# Patient Record
Sex: Female | Born: 1995 | Race: White | Hispanic: No | Marital: Married | Smoking: Former smoker
Health system: Southern US, Community
[De-identification: ages and names within clinical notes are randomized; demographics above are authoritative.]

## PROBLEM LIST (undated history)

## (undated) DIAGNOSIS — K5904 Chronic idiopathic constipation: Secondary | ICD-10-CM

## (undated) DIAGNOSIS — IMO0002 Reserved for concepts with insufficient information to code with codable children: Secondary | ICD-10-CM

## (undated) DIAGNOSIS — Q7962 Hypermobile Ehlers-Danlos syndrome: Secondary | ICD-10-CM

## (undated) DIAGNOSIS — F419 Anxiety disorder, unspecified: Secondary | ICD-10-CM

## (undated) DIAGNOSIS — J45909 Unspecified asthma, uncomplicated: Secondary | ICD-10-CM

## (undated) DIAGNOSIS — T7840XA Allergy, unspecified, initial encounter: Secondary | ICD-10-CM

## (undated) DIAGNOSIS — G4711 Idiopathic hypersomnia with long sleep time: Secondary | ICD-10-CM

## (undated) DIAGNOSIS — R55 Syncope and collapse: Secondary | ICD-10-CM

## (undated) DIAGNOSIS — G90A Postural orthostatic tachycardia syndrome (POTS): Secondary | ICD-10-CM

## (undated) DIAGNOSIS — D8940 Mast cell activation, unspecified: Secondary | ICD-10-CM

## (undated) DIAGNOSIS — F32A Depression, unspecified: Secondary | ICD-10-CM

## (undated) DIAGNOSIS — G43E09 Chronic migraine with aura, not intractable, without status migrainosus: Secondary | ICD-10-CM

## (undated) DIAGNOSIS — G629 Polyneuropathy, unspecified: Secondary | ICD-10-CM

## (undated) DIAGNOSIS — D649 Anemia, unspecified: Secondary | ICD-10-CM

## (undated) HISTORY — DX: Polyneuropathy, unspecified: G62.9

## (undated) HISTORY — DX: Allergy, unspecified, initial encounter: T78.40XA

## (undated) HISTORY — PX: OTHER SURGICAL HISTORY: SHX169

## (undated) HISTORY — DX: Syncope and collapse: R55

## (undated) HISTORY — DX: Postural orthostatic tachycardia syndrome (POTS): G90.A

## (undated) HISTORY — DX: Reserved for concepts with insufficient information to code with codable children: IMO0002

## (undated) HISTORY — DX: Hypermobile Ehlers-Danlos syndrome: Q79.62

## (undated) HISTORY — DX: Chronic idiopathic constipation: K59.04

## (undated) HISTORY — DX: Mast cell activation, unspecified: D89.40

## (undated) HISTORY — DX: Depression, unspecified: F32.A

## (undated) HISTORY — DX: Chronic migraine with aura, not intractable, without status migrainosus: G43.E09

## (undated) HISTORY — DX: Unspecified asthma, uncomplicated: J45.909

## (undated) HISTORY — DX: Idiopathic hypersomnia with long sleep time: G47.11

## (undated) HISTORY — DX: Anemia, unspecified: D64.9

---
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
## 2015-02-15 ENCOUNTER — Emergency Department (HOSPITAL_COMMUNITY)
Admission: EM | Admit: 2015-02-15 | Discharge: 2015-02-15 | Disposition: A | Discharge status: Home / Self Care | Payer: BLUE CROSS/BLUE SHIELD | Attending: Emergency Medicine | Admitting: Emergency Medicine

## 2015-02-15 ENCOUNTER — Encounter (HOSPITAL_COMMUNITY): Payer: Self-pay

## 2015-02-15 ENCOUNTER — Ambulatory Visit (HOSPITAL_COMMUNITY)
Admission: EM | Admit: 2015-02-15 | Discharge: 2015-02-15 | Disposition: A | Discharge status: Home / Self Care | Payer: No Typology Code available for payment source | Source: Ambulatory Visit | Attending: Emergency Medicine | Admitting: Emergency Medicine

## 2015-02-15 DIAGNOSIS — Z0441 Encounter for examination and observation following alleged adult rape: Secondary | ICD-10-CM | POA: Insufficient documentation

## 2015-02-15 DIAGNOSIS — Y998 Other external cause status: Secondary | ICD-10-CM | POA: Diagnosis not present

## 2015-02-15 DIAGNOSIS — Y9389 Activity, other specified: Secondary | ICD-10-CM | POA: Insufficient documentation

## 2015-02-15 DIAGNOSIS — Z8659 Personal history of other mental and behavioral disorders: Secondary | ICD-10-CM | POA: Diagnosis not present

## 2015-02-15 DIAGNOSIS — Y9289 Other specified places as the place of occurrence of the external cause: Secondary | ICD-10-CM | POA: Diagnosis not present

## 2015-02-15 DIAGNOSIS — T7621XA Adult sexual abuse, suspected, initial encounter: Secondary | ICD-10-CM | POA: Diagnosis present

## 2015-02-15 HISTORY — DX: Anxiety disorder, unspecified: F41.9

## 2015-02-15 LAB — POC URINE PREG, ED: PREG TEST UR: NEGATIVE

## 2015-02-15 MED ORDER — PROMETHAZINE HCL 25 MG PO TABS
25.0000 mg | ORAL_TABLET | Freq: Four times a day (QID) | ORAL | Status: DC | PRN
  Administered 2015-02-15: 75 mg via ORAL
  Filled 2015-02-15: qty 1

## 2015-02-15 MED ORDER — ULIPRISTAL ACETATE 30 MG PO TABS
30.0000 mg | ORAL_TABLET | Freq: Once | ORAL | Status: AC
  Administered 2015-02-15: 30 mg via ORAL
  Filled 2015-02-15: qty 1

## 2015-02-15 MED ORDER — AZITHROMYCIN 1 G PO PACK
1.0000 g | PACK | Freq: Once | ORAL | Status: AC
  Administered 2015-02-15: 1 g via ORAL
  Filled 2015-02-15: qty 1

## 2015-02-15 MED ORDER — CEFIXIME 400 MG PO TABS
400.0000 mg | ORAL_TABLET | Freq: Once | ORAL | Status: AC
  Administered 2015-02-15: 400 mg via ORAL
  Filled 2015-02-15: qty 1

## 2015-02-15 MED ORDER — METRONIDAZOLE 500 MG PO TABS
2000.0000 mg | ORAL_TABLET | Freq: Once | ORAL | Status: AC
  Administered 2015-02-15: 2000 mg via ORAL
  Filled 2015-02-15: qty 4
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
## 2015-02-15 NOTE — ED Notes (Signed)
Aware of pt's being confidential and visitors. Pt comfortable with boyfriend coming back.
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
## 2015-02-15 NOTE — ED Notes (Signed)
[facility]
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
## 2015-02-15 NOTE — ED Notes (Signed)
Aware of Sane RN and plan of care
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
## 2015-05-19 ENCOUNTER — Encounter (HOSPITAL_COMMUNITY): Payer: Self-pay | Admitting: *Deleted

## 2015-05-19 ENCOUNTER — Emergency Department (HOSPITAL_COMMUNITY)
Admission: EM | Admit: 2015-05-19 | Discharge: 2015-05-20 | Disposition: A | Discharge status: Home / Self Care | Payer: BLUE CROSS/BLUE SHIELD | Attending: Emergency Medicine | Admitting: Emergency Medicine

## 2015-05-19 DIAGNOSIS — M25561 Pain in right knee: Secondary | ICD-10-CM | POA: Insufficient documentation

## 2015-05-19 DIAGNOSIS — F419 Anxiety disorder, unspecified: Secondary | ICD-10-CM | POA: Diagnosis not present

## 2015-05-19 DIAGNOSIS — Z79899 Other long term (current) drug therapy: Secondary | ICD-10-CM | POA: Insufficient documentation

## 2015-05-19 NOTE — ED Provider Notes (Signed)
CSN: [id number]     Arrival date & time 05/19/15  2238 History  By signing my name below, I, Lyndel Safe, attest that this documentation has been prepared under the direction and in the presence of Felicie Morn, NP. Electronically Signed: Lyndel Safe, ED Scribe. 05/19/2015. 11:49 PM.   Chief Complaint  Patient presents with  . Knee Pain   Patient is a 20 y.o. female presenting with knee pain. The history is provided by the patient. No language interpreter was used.  Knee Pain Location:  Knee Time since incident:  1 week Injury: no   Knee location:  R knee Pain details:    Radiates to:  Does not radiate   Severity:  Mild   Duration:  1 week   Timing:  Constant   Progression:  Unchanged Chronicity:  New Relieved by:  Nothing Worsened by:  Bearing weight Ineffective treatments:  Ice Associated symptoms: swelling (minimal)    HPI Comments: Shelby Dyer is a 20 y.o. female who presents to the Emergency Department complaining of constant, mild anterior right knee pain X ~ 1 week, onset without injury or trauma, that is exacerbated with weight bearing. Pt has been applying ice without significant relief. She is ambulatory without difficulty.   Past Medical History  Diagnosis Date  . Anxiety    History reviewed. No pertinent past surgical history. No family history on file. Social History  Substance Use Topics  . Smoking status: Never Smoker   . Smokeless tobacco: None  . Alcohol Use: No   OB History    No data available     Review of Systems  Musculoskeletal: Positive for arthralgias ( right knee). Negative for gait problem.  Skin: Negative for color change and wound.  All other systems reviewed and are negative.  Allergies  Review of patient's allergies indicates no known allergies.  Home Medications   Prior to Admission medications   Medication Sig Start Date End Date Taking? Authorizing Provider  Armodafinil 250 MG tablet Take 125 mg by mouth daily.  02/10/15   Historical Provider, MD  hydrOXYzine (ATARAX/VISTARIL) 25 MG tablet Take 25-50 tablets by mouth every 6 (six) hours as needed for anxiety.  12/08/14   Historical Provider, MD  ibuprofen (ADVIL,MOTRIN) 200 MG tablet Take 400 mg by mouth every 6 (six) hours as needed for moderate pain.    Historical Provider, MD  levonorgestrel (MIRENA) 20 MCG/24HR IUD 1 each by Intrauterine route once.    Historical Provider, MD  venlafaxine XR (EFFEXOR-XR) 37.5 MG 24 hr capsule Take 150 mg by mouth daily. 12/08/14   Historical Provider, MD   BP 125/78 mmHg  Pulse 118  Temp(Src) 98 F (36.7 C) (Oral)  Resp 20  Ht  (1.753 m)  Wt 254 lb 8 oz (115.44 kg)  BMI 37.57 kg/m2  SpO2 96% Physical Exam  Constitutional: She is oriented to person, place, and time. She appears well-developed and well-nourished. No distress.  HENT:  Head: Normocephalic.  Eyes: Conjunctivae are normal.  Neck: Normal range of motion. Neck supple.  Cardiovascular: Normal rate.   Pulmonary/Chest: Effort normal. No respiratory distress.  Musculoskeletal: Normal range of motion. She exhibits tenderness.  Tenderness just below the right knee with minimal swelling, knee stable, good pulse and sensation, strength normal.   Neurological: She is alert and oriented to person, place, and time. Coordination normal.  Skin: Skin is warm.  Psychiatric: She has a normal mood and affect. Her behavior is normal.  Nursing note and  vitals reviewed.   ED Course  Procedures  DIAGNOSTIC STUDIES: Oxygen Saturation is 96% on RA, adquate by my interpretation.    COORDINATION OF CARE: 11:45 PM Discussed treatment plan with pt at bedside which includes to order Xray of right knee. PT agreeable to plan.  Imaging Review Dg Knee Complete 4 Views Right  05/20/2015  CLINICAL DATA:  Right knee pain for 1 week.  No known injury. EXAM: RIGHT KNEE - COMPLETE 4+ VIEW COMPARISON:  None. FINDINGS: There is no evidence of fracture, dislocation, or joint  effusion. There is no evidence of arthropathy or other focal bone abnormality. Soft tissues are unremarkable. IMPRESSION: Negative radiographs of the right knee. Electronically Signed   By: Rubye Oaks M.D.   On: 05/20/2015 00:27   I have personally reviewed and evaluated these images results as part of my medical decision-making.  Radiology results reviewed and shared with patient. MDM   Final diagnoses:  None    Patient X-Ray negative for obvious fracture or dislocation.  Pt advised to follow up with orthopedics. Patient given knee sleeve while in ED, conservative therapy recommended and discussed. Patient will be discharged home & is agreeable with above plan. Returns precautions discussed. Pt appears safe for discharge.  I personally performed the services described in this documentation, which was scribed in my presence. The recorded information has been reviewed and is accurate.    Felicie Morn, NP 05/20/15 1610  Gwyneth Sprout, MD 05/20/15 [phone]

## 2015-05-19 NOTE — ED Notes (Signed)
Pt c/o rt knee pain for one week  No known injury  lmp   iud

## 2015-05-20 ENCOUNTER — Emergency Department (HOSPITAL_COMMUNITY): Payer: BLUE CROSS/BLUE SHIELD

## 2015-05-20 MED ORDER — NAPROXEN 500 MG PO TABS: 500.0000 mg | ORAL_TABLET | Freq: Two times a day (BID) | ORAL | Status: DC

## 2015-05-20 NOTE — ED Notes (Signed)
Patient able to ambulate independently  

## 2015-05-20 NOTE — Discharge Instructions (Signed)

## 2019-03-13 DIAGNOSIS — I4711 Inappropriate sinus tachycardia, so stated: Secondary | ICD-10-CM | POA: Insufficient documentation

## 2019-03-13 DIAGNOSIS — R000 Tachycardia, unspecified: Secondary | ICD-10-CM | POA: Insufficient documentation

## 2019-06-25 DIAGNOSIS — G5611 Other lesions of median nerve, right upper limb: Secondary | ICD-10-CM | POA: Insufficient documentation

## 2019-06-25 DIAGNOSIS — S61451A Open bite of right hand, initial encounter: Secondary | ICD-10-CM | POA: Insufficient documentation

## 2021-04-03 DIAGNOSIS — K589 Irritable bowel syndrome without diarrhea: Secondary | ICD-10-CM | POA: Insufficient documentation

## 2021-05-17 ENCOUNTER — Ambulatory Visit (INDEPENDENT_AMBULATORY_CARE_PROVIDER_SITE_OTHER): Payer: BLUE CROSS/BLUE SHIELD | Admitting: Family Medicine

## 2021-05-17 ENCOUNTER — Encounter: Payer: Self-pay | Admitting: Family Medicine

## 2021-05-17 VITALS — BP 134/92 | Ht 69.0 in | Wt 263.0 lb

## 2021-05-17 DIAGNOSIS — D8940 Mast cell activation, unspecified: Secondary | ICD-10-CM | POA: Diagnosis not present

## 2021-05-17 DIAGNOSIS — G43711 Chronic migraine without aura, intractable, with status migrainosus: Secondary | ICD-10-CM | POA: Insufficient documentation

## 2021-05-17 DIAGNOSIS — M357 Hypermobility syndrome: Secondary | ICD-10-CM

## 2021-05-17 DIAGNOSIS — I951 Orthostatic hypotension: Secondary | ICD-10-CM | POA: Diagnosis not present

## 2021-05-17 DIAGNOSIS — G43C1 Periodic headache syndromes in child or adult, intractable: Secondary | ICD-10-CM | POA: Diagnosis not present

## 2021-05-17 NOTE — Assessment & Plan Note (Signed)
Acute on chronic in nature.  -Referral to cardiology.

## 2021-05-17 NOTE — Assessment & Plan Note (Signed)
Acute on chronic in nature. -Referral to neurology.

## 2021-05-17 NOTE — Assessment & Plan Note (Signed)
Has had intermittent episodes of anaphylaxis and has been established with an allergist

## 2021-05-17 NOTE — Assessment & Plan Note (Signed)
Beighton score 9/9.  Has a family history as well as multiple syndromes that are falling in line with the constellation of hypermobility and Ehlers-Danlos. -Counseled on home exercise therapy and supportive care. -Counseled on body braid. -Referral to physical therapy.

## 2021-05-17 NOTE — Progress Notes (Signed)
°  Shelby Dyer - 26 y.o. female MRN [id number]  Date of birth: 01-Aug-1995  SUBJECTIVE:  Including CC & ROS.  No chief complaint on file.   Shelby Dyer is a 26 y.o. female that is presenting with orthostatic tachycardia, multiple allergies, multiple joint pains, long history of migraines.  She has a mother and grandmother both diagnosed with Ehlers-Danlos.  She has daily migraines since October.  She has had her eyes checked recently.  She continues to have orthostatic tachycardia and has been started on metoprolol.  Has pain in multiple joints on a regular basis.  No history of surgery.  She takes ibuprofen and Tylenol as needed.  Review of the Holter monitor from 01/10/2021 shows 6 days and 22 hours with an average heart rate of 88 bpm with no arrhythmia and rare PACs and PVCs.  There were 27 patient triggered events all of which corresponded to sinus tachycardia. Review of the CT head from 02/04/2021 shows no acute changes. Review of the sedimentation rate and C-reactive protein from 2021 were normal. Review of the TSH from 2020 was normal.  Review of Systems See HPI   HISTORY: Past Medical, Surgical, Social, and Family History Reviewed & Updated per EMR.   Pertinent Historical Findings include:  Past Medical History:  Diagnosis Date   Anxiety     History reviewed. No pertinent surgical history.   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  Has a family history as well as multiple syndromes that are falling in line with the constellation of hypermobility and  Ehlers-Danlos. -Counseled on home exercise therapy and supportive care. -Counseled on body braid. -Referral to physical therapy.  Intractable periodic headache syndrome Acute on chronic in nature. -Referral to neurology.  Dysautonomia orthostatic hypotension syndrome Acute on chronic in nature.  -Referral to cardiology.

## 2021-05-17 NOTE — Patient Instructions (Signed)
Nice to meet you Please check out body braid  Please look into [email or web address]  We've made referrals today   Please send me a message in MyChart with any questions or updates.  Please see me back as needed.   --Dr. 

## 2021-05-18 ENCOUNTER — Other Ambulatory Visit: Payer: Self-pay | Admitting: Family Medicine

## 2021-05-22 ENCOUNTER — Encounter: Payer: Self-pay | Admitting: Family Medicine

## 2021-06-09 ENCOUNTER — Other Ambulatory Visit: Payer: Self-pay

## 2021-06-12 ENCOUNTER — Encounter: Payer: Self-pay | Admitting: Physical Therapy

## 2021-06-12 ENCOUNTER — Ambulatory Visit: Payer: 59 | Attending: Family Medicine | Admitting: Physical Therapy

## 2021-06-12 ENCOUNTER — Other Ambulatory Visit: Payer: Self-pay

## 2021-06-12 DIAGNOSIS — R2689 Other abnormalities of gait and mobility: Secondary | ICD-10-CM | POA: Diagnosis present

## 2021-06-12 DIAGNOSIS — M357 Hypermobility syndrome: Secondary | ICD-10-CM | POA: Insufficient documentation

## 2021-06-12 DIAGNOSIS — M2550 Pain in unspecified joint: Secondary | ICD-10-CM | POA: Insufficient documentation

## 2021-06-12 NOTE — Therapy (Signed)
?OUTPATIENT PHYSICAL THERAPY THORACOLUMBAR EVALUATION ? ? ?Patient Name: Shelby Dyer ?MRN: [id number] ?DOB:09-15-1995, 26 y.o., female ?Today's Date: 06/12/2021 ? ? PT End of Session - 06/12/21 1024   ? ? Visit Number 1   ? Number of Visits 16   ? Date for PT Re-Evaluation 08/07/21   ? Authorization Type BCBS   ? PT Start Time 1015   ? PT Stop Time 1100   ? PT Time Calculation (min) 45 min   ? Activity Tolerance Patient tolerated treatment well   ? Behavior During Therapy [facility] for tasks assessed/performed   ? ?  ?  ? ?  ? ? ?Past Medical History:  ?Diagnosis Date  ? Anxiety   ? ?History reviewed. No pertinent surgical history. ?Patient Active Problem List  ? Diagnosis Date Noted  ? 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 Dysautonomia orthostatic hypotension syndrome 05/17/2021  ? 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 IBS (irritable bowel syndrome) 04/03/2021  ? Dog bite, hand, right, initial encounter 06/25/2019  ? 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 Sinus tachycardia 03/13/2019  ? 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 ? ?PCP: Eulogio Bear, MD ? ?REFERRING PROVIDER: Myra Rude, MD ? ?REFERRING DIAG: Hypermobility syndrome ? ?THERAPY DIAG:  ?Hypermobility syndrome, weakness,  ? ?ONSET DATE: chronic  ? ?SUBJECTIVE:                                                                                                                                                                                          ? ?SUBJECTIVE STATEMENT: ?Patient presents with chronic medical issues including hypermobility, dysautonomia.  She has issues with prolonged standing, syncope and irregular HR.  She has migrating joint pain (ankle, knees, L shoulder , Lower T spine).  Her joints sublux (knees and shoulder) but don's dislocate.  She struggles with walking, loses balance and has difficulty maintaining normal speed. She recalls having issues as a young child,  allergies and problems with coordination and balance.  She would like to see if PT can assist her overall mobility and improve quality of life.    ? ? ?PERTINENT HISTORY:  ?Migraines, hypermobility  ?MCAS, tachycardia, IBS ?Her mother has EDS and her sister has POTS ? ?PAIN:  ?Are you having pain? Yes: NPRS scale: 3/10 ?Pain location: generalized, neck today ?Pain description: achy ?Aggravating factors: activity. Back pain (standing long time) and carrying item in R hand ?Relieving factors: meds, heating pad, rest  ? ? ?PRECAUTIONS: Other: POTS , positional changes.  Give seated rest breaks for standing, blood pools in LE  ? ?WEIGHT BEARING RESTRICTIONS No ? ?  FALLS:  ?Has patient fallen in last 6 months? No, Number of falls: 0, does not usually fall but does have syncope and lacks confidence  ? ?LIVING ENVIRONMENT: ?Lives with: lives with their spouse ?Lives in: House/apartment ?Stairs: Yes; Internal: 12 steps; on right going up ?Has following equipment at home:  therapy dog , considering a cane  ? 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 ?PLOF: Independent, Vocation/Vocational requirements: crouching, standing, walking , and Leisure: used to go hiking and be outdoors, married 6 mos ago . Can do 1 task per day (laundry OR dishes)  ? ?PATIENT GOALS I would like to go hiking again.  Does enjoy exercising, weightlifting  ? ? ?Assessment (as on reg. Template) ?Beighton Scale 7/8 ?Lumbar (1_/1) ?Knees (_2/2) ?Elbows (_2/2)  ?5th digit (1/2) ?Thumb (2/2) ? ?OBJECTIVE:  ? ?DIAGNOSTIC FINDINGS:  ?CT last yet ? ?PATIENT SURVEYS:  ?Bristol Impact Scale  given on eval  ? ?COGNITION: ? Overall cognitive status: Within functional limits for tasks assessed   ?  ?SENSATION: ?Reports hands, feet in the past but improved with med change  ?Amrs and hands sleeping AM frequently ? ?MUSCLE LENGTH: ?Hamstrings: tight ? ?POSTURE:  ?Forward head, rounded shoulders ? ?PALPATION: ?NT ? ?LUMBAR ROM:  ? ?Active  A/PROM  ?06/12/2021   ?Flexion Palms to flor  ?Extension Hyper  ?Right lateral flexion   ?Left lateral flexion   ?Right rotation Hyper  ?Left rotation Hyper   ? (Blank rows = not tested) ? ?LE ROM: ? ?Active  Right ?06/12/2021 Left ?06/12/2021  ?Hip flexion    ?Hip extension    ?Hip abduction    ?Hip adduction    ?Hip internal rotation Rel tight Rel tight   ?Hip external rotation WNL WNL  ?Knee flexion    ?Knee extension    ?Ankle dorsiflexion    ?Ankle plantarflexion    ?Ankle inversion    ?Ankle eversion    ? (Blank rows = not tested) ? ?LE MMT: ? ?MMT Right ?06/12/2021 Left ?06/12/2021  ?Hip flexion 4+ 4+  ?Hip extension 4- 4-  ?Hip abduction 4 4  ?Hip adduction    ?Hip internal rotation    ?Hip external rotation    ?Knee flexion 4+ 4  ?Knee extension 4+ 4+  ?Ankle dorsiflexion 4+ 4+  ?Ankle plantarflexion    ?Ankle inversion    ?Ankle eversion    ? (Blank rows = not tested) ? ?FUNCTIONAL TESTS:  ?2 minute walk test: 412 feet , HR 120 max Sa02 99% ? ?GAIT: ?Distance walked: 150 ?Assistive device utilized: None ?Level of assistance: Complete Independence ?Comments: none  ? ? ? ?TODAY'S TREATMENT  ?Eval, intro, POC  ? ? ?PATIENT EDUCATION:  ?Education details: PT, POC ?Person educated: Patient ?Education method: Explanation ?Education comprehension: verbalized understanding ? ? ?HOME EXERCISE PROGRAM: ?None yet  ? ?ASSESSMENT: ? ?CLINICAL IMPRESSION: ?Patient is a 26 y.o. female who was seen today for physical therapy evaluation and treatment for hypermobility syndrome with dysautonomia.  HR tachycardia at rest 114. She has min to mod pain overall.  Hips are stable and she has decent strength.  She tolerated the session well with her therapy dog present, mainly for PTSD support.  She will benefit from skilled PT to include core strength and balance/proprioceptive work. ? ? ?OBJECTIVE IMPAIRMENTS cardiopulmonary status limiting activity, decreased activity tolerance, decreased balance, decreased coordination, decreased endurance, decreased  mobility, difficulty walking, decreased ROM, decreased strength, dizziness, obesity, and pain.  ? ?ACTIVITY LIMITATIONS cleaning, community activity,  meal prep, occupation, laundry, yard work, and shopping.  ? ?PERSONAL FACTORS Past/current experiences, Time since onset of injury/illness/exacerbation, and 3+ comorbidities: chronic joint pain, hypersomnelence, migraines, POTS, MCAS  are also affecting patient's functional outcome.  ? ? ?REHAB POTENTIAL: Excellent ? ?CLINICAL DECISION MAKING: Stable/uncomplicated ? ?EVALUATION COMPLEXITY: Low ? ? ?GOALS: ?Goals reviewed with patient? No ? ?SHORT TERM GOALS: Target date: 07/03/2021 ? ?Pt will be I with HEP for core, pelvic stability  ?Baseline: unknown ?Goal status: INITIAL ? ?2.  Pt will complete Survey for hypermobility [facility] Impact Scale) and set goal.  ?Baseline: given on eval  ?Goal status: INITIAL ? ?3.  Pt will report increased tolerance for work, home activities, reporting less fatigue overall with 1-2 tasks  ?Baseline: low energy , hard to do > 1 simple home task  ?Goal status: INITIAL ? ?4  Pt will be screened for balance (DGI) and goal set  ?Baseline: NT on eval  ?Goal status: INITIAL ? ?LONG TERM GOALS: Target date: 08/07/2021 ? ?Pt will be able to walk 600 feet or more on the 2 min walk test to demo improved exercise tolerance  ?Baseline: 412 feet  ?Goal status: INITIAL ? ?2.  Pt will understand and demo proper posture and body mechanics as it pertains to lifting and squatting for work, home tasks  ?Baseline: needs reinforcement ?Goal status: INITIAL ? ?3.  Pt will be able to walk on uneven ground with improved confidence in balance  ?Baseline: loses balance  ?Goal status: INITIAL ? ?4.  Pt will demo 5/5 strength in UE and LE for maximal joint support  ?Baseline: 4-/5 to 4+/5  ?Goal status: INITIAL ? ?5.  Pt will be able to meet goals for balance based on results.  ?Baseline: TBA ?Goal status: INITIAL ? ? ? ? ?PLAN: ?PT FREQUENCY: 1x/week ? ?PT DURATION:  8 weeks ? ?PLANNED INTERVENTIONS: Therapeutic exercises, Therapeutic activity, Neuromuscular re-education, Balance training, Gait training, Patient/Family education, Joint mobilization, Aquatic Therapy, Electric

## 2021-06-13 ENCOUNTER — Encounter: Payer: Self-pay | Admitting: Cardiology

## 2021-06-13 ENCOUNTER — Ambulatory Visit (INDEPENDENT_AMBULATORY_CARE_PROVIDER_SITE_OTHER): Payer: BLUE CROSS/BLUE SHIELD | Admitting: Cardiology

## 2021-06-13 ENCOUNTER — Ambulatory Visit (INDEPENDENT_AMBULATORY_CARE_PROVIDER_SITE_OTHER): Payer: 59

## 2021-06-13 VITALS — BP 142/78 | HR 124 | Ht 69.0 in | Wt 262.0 lb

## 2021-06-13 DIAGNOSIS — I951 Orthostatic hypotension: Secondary | ICD-10-CM

## 2021-06-13 DIAGNOSIS — R000 Tachycardia, unspecified: Secondary | ICD-10-CM

## 2021-06-13 DIAGNOSIS — F509 Eating disorder, unspecified: Secondary | ICD-10-CM | POA: Insufficient documentation

## 2021-06-13 DIAGNOSIS — M357 Hypermobility syndrome: Secondary | ICD-10-CM | POA: Diagnosis not present

## 2021-06-13 DIAGNOSIS — M25559 Pain in unspecified hip: Secondary | ICD-10-CM | POA: Insufficient documentation

## 2021-06-13 NOTE — Progress Notes (Signed)
? ?Cardiology Consultation:   ? ?Date:  06/13/2021  ? ?ID:  Shelby Dyer, DOB [birth year], MRN [id number] ? ?PCP:  Legrand Como, NP  ?Cardiologist:  Jenne Campus, MD  ? ?Referring MD: Rosemarie Ax, MD  ? ?Chief Complaint  ?Patient presents with  ? Discuss possible Pott's or Hypotensive   ? ? ?History of Present Illness:   ? ?Shelby Dyer is a 26 y.o. female who is being seen today for the evaluation of persistent tachycardia with possibility of POTS syndrome at the request of Rosemarie Ax, MD. with past medical history significant for hypermobility syndrome look like she does have Ehlers-Danlos syndrome, she is been diagnosed previously with inappropriate sinus tachycardia syndrome also orthostatic hypotension.  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  She was referred to [place] because of tachycardia it looks like she got persistent sinus tachycardia in the matter-of-fact her EKG today show me sinus tachycardia at rate of 125.  She feels this and she feels very bad when she has and she also complained of having some dizzy spells.  She said she passed out maybe 3 times in her life last time about 2 to 3 weeks ago interesting story is that her doctor very appropriately give her small dose of beta-blocker which seems to be helping however she wanted to be seen by [place] in the our office we doubt medications, therefore she stopped her metoprolol.  After that couple days later she was walking upstairs then she became dizzy sweaty lightheaded tunnel vision and passed out.  Prodromal time was for about 30 seconds also usually is a little longer.  She quickly regained consciousness and she was fine after that. ?She is being evaluated at [facility] she did have echocardiogram that a monitor at the time she was told to have inappropriate sinus tachycardia syndrome then she did see some cardiologist in [place].  She was given beta-blocker at that  time. ?She does not smoke, does not drink ? ? ?Past Medical History:  ?Diagnosis Date  ? Anxiety   ? ? ?Past Surgical History:  ?Procedure Laterality Date  ? Tub place in both ear  Bilateral   ? When she was 26 years of age  ? WISDOM TOOTH EXTRACTION  2006  ? All 4  ? ? ?Current Medications: ?Current Meds  ?Medication Sig  ? ALPRAZolam (XANAX XR) 0.5 MG 24 hr tablet Take 0.5 mg by mouth as needed for anxiety.  ? cetirizine (ZYRTEC ALLERGY) 10 MG tablet Take 20 mg by mouth daily.  ? Cyanocobalamin (VITAMIN B12) 1000 MCG TBCR Take 1 tablet by mouth once a week.  ? EPINEPHrine 0.3 mg/0.3 mL IJ SOAJ injection Inject 0.3 mg into the muscle as needed for anaphylaxis.  ? famotidine (PEPCID) 40 MG tablet Take 40 mg by mouth 2 (two) times daily.  ? fexofenadine (ALLEGRA) 180 MG tablet Take 180 mg by mouth as needed for allergies or rhinitis.  ? hydrOXYzine (ATARAX) 25 MG tablet Take 25-50 mg by mouth every 8 (eight) hours as needed for anxiety.  ? hyoscyamine (LEVSIN) 0.125 MG/ML solution Take 0.125 mg by mouth every 4 (four) hours as needed for bladder spasms.  ? metoprolol tartrate (LOPRESSOR) 25 MG tablet Take 25 mg by mouth 2 (two) times daily.  ? Multiple Vitamin (MULTIVITAMIN) tablet Take 1 tablet by mouth daily.  ? nortriptyline (PAMELOR) 25 MG capsule Take 25-50 mg by mouth daily after supper.  ?  ? ?  Allergies:   Latex  ? ?Social History  ? ?Socioeconomic History  ? Marital status: Married  ?  Spouse name: Not on file  ? Number of children: Not on file  ? Years of education: Not on file  ? Highest education level: Not on file  ?Occupational History  ? Not on file  ?Tobacco Use  ? Smoking status: Never  ? Smokeless tobacco: Not on file  ?Substance and Sexual Activity  ? Alcohol use: No  ? Drug use: Not on file  ? Sexual activity: Not on file  ?Other Topics Concern  ? Not on file  ?Social History Narrative  ? Not on file  ? ?Social Determinants of Health  ? ?Financial Resource Strain: Not on file  ?Food Insecurity: Not  on file  ?Transportation Needs: Not on file  ?Physical Activity: Not on file  ?Stress: Not on file  ?Social Connections: Not on file  ?  ? ?Family History: ?The patient's family history includes Atrial fibrillation in her father; Hypertension in her brother; Other in her sister. ?ROS:   ?Please see the history of present illness.    ?All 14 point review of systems negative except as described per history of present illness. ? ?EKGs/Labs/Other Studies Reviewed:   ? ?The following studies were reviewed today: ? ? ?EKG:  EKG is  ordered today.  The ekg ordered today demonstrates sinus tachycardia rate 125, right axis deviation, low voltage, cannot rule out inferior infarct. ? ?Recent Labs: ?No results found for requested labs within last 8760 hours.  ?Recent Lipid Panel ?No results found for: CHOL, TRIG, HDL, CHOLHDL, VLDL, LDLCALC, LDLDIRECT ? ?Physical Exam:   ? ?VS:  BP (!) 142/78 (BP Location: Left Arm, Patient Position: Sitting)   Pulse (!) 124   Ht 5\' 9"  (1.753 m)   Wt 262 lb (118.8 kg)   SpO2 97%   BMI 38.69 kg/m?    ? ?Wt Readings from Last 3 Encounters:  ?06/13/21 262 lb (118.8 kg)  ?05/17/21 263 lb (119.3 kg)  ?05/19/15 254 lb 8 oz (115.4 kg) (>99 %, Z= 2.52)*  ? ?* Growth percentiles are based on CDC (Girls, 2-20 Years) data.  ?  ? ?GEN:  Well nourished, well developed in no acute distress ?HEENT: Normal ?NECK: No JVD; No carotid bruits ?LYMPHATICS: No lymphadenopathy ?CARDIAC: RRR, no murmurs, no rubs, no gallops ?RESPIRATORY:  Clear to auscultation without rales, wheezing or rhonchi  ?ABDOMEN: Soft, non-tender, non-distended ?MUSCULOSKELETAL:  No edema; No deformity  ?SKIN: Warm and dry ?NEUROLOGIC:  Alert and oriented x 3 ?PSYCHIATRIC:  Normal affect  ? ?ASSESSMENT:   ? ?1. Inappropriate sinus tachycardia   ?2. Hypermobility syndrome   ?3. Dysautonomia orthostatic hypotension syndrome   ? ?PLAN:   ? ?In order of problems listed above: ? ?Sinus tachycardia which appears to be inappropriate sinus  tachycardia, I cannot exclude POTS, although his disease is belong to the same class of diseases which are related to autonomic dysfunction.  So far no organic reason for her tachycardia.  I will ask her to have echocardiogram to make sure her heart is structurally normal, I did review laboratory tests which were perfectly normal did include CBC complete pensive metabolic panel, however do not see TSH.  We will do the test.  Last time TSH was checked when she was at [facility] in 2020.  I will put a Zio patch on her for 2 weeks.  Ask her after couple days to start taking back metoprolol.  We  will also schedule her to have echocardiogram if she was structurally her heart is normal. ?Hypermobility syndrome/Ehlers-Danlos syndrome that being follow-up by orthopedics. ?Dysautonomia with orthostatic hypotension syndrome we will check orthostasis today.  I encouraged her to stay well-hydrated I recommended to drink Gatorade.  I also recommend start exercises on the regular basis gradually slowly improved stamina.  That should help with inappropriate sinus tachycardia as well as POTS.  We will try to treat this with beta-blocker.  She may required ivabradine depends how she respond to beta-blocker first ? ? ?Medication Adjustments/Labs and Tests Ordered: ?Current medicines are reviewed at length with the patient today.  Concerns regarding medicines are outlined above.  ?No orders of the defined types were placed in this encounter. ? ?No orders of the defined types were placed in this encounter. ? ? ?Signed, ?Park Liter, MD, [facility]. ?06/13/2021 10:33 AM    ?[place] ?

## 2021-06-13 NOTE — Patient Instructions (Signed)
Medication Instructions:  ?Your physician recommends that you continue on your current medications as directed. Please refer to the Current Medication list given to you today. ? ?*If you need a refill on your cardiac medications before your next appointment, please call your pharmacy* ? ? ?Lab Work: ?None ?If you have labs (blood work) drawn today and your tests are completely normal, you will receive your results only by: ?MyChart Message (if you have MyChart) OR ?A paper copy in the mail ?If you have any lab test that is abnormal or we need to change your treatment, we will call you to review the results. ? ? ?Testing/Procedures: ?A zio monitor was ordered today. It will remain on for 14 days. You will then return monitor and event diary in provided box. It takes 1-2 weeks for report to be downloaded and returned to us. We will call you with the results. If monitor falls off or has orange flashing light, please call Zio for further instructions.  ? ?Your physician has requested that you have an echocardiogram. Echocardiography is a painless test that uses sound waves to create images of your heart. It provides your doctor with information about the size and shape of your heart and how well your heart?s chambers and valves are working. This procedure takes approximately one hour. There are no restrictions for this procedure. ? ? ? ?Follow-Up: ?At [facility], you and your health needs are our priority.  As part of our continuing mission to provide you with exceptional heart care, we have created designated Provider Care Teams.  These Care Teams include your primary Cardiologist (physician) and Advanced Practice Providers (APPs -  Physician Assistants and Nurse Practitioners) who all work together to provide you with the care you need, when you need it. ? ?We recommend signing up for the patient portal called "MyChart".  Sign up information is provided on this After Visit Summary.  MyChart is used to connect with  patients for Virtual Visits (Telemedicine).  Patients are able to view lab/test results, encounter notes, upcoming appointments, etc.  Non-urgent messages can be sent to your provider as well.   ?To learn more about what you can do with MyChart, go to [email or web address].   ? ?Your next appointment:   ?3 month(s) ? ?The format for your next appointment:   ?In Person ? ?Provider:   ?Robert Krasowski, MD  ? ? ?Other Instructions ?None ? ?

## 2021-06-16 ENCOUNTER — Encounter: Payer: Self-pay | Admitting: Family Medicine

## 2021-06-19 ENCOUNTER — Other Ambulatory Visit: Payer: Self-pay | Admitting: Family Medicine

## 2021-06-19 DIAGNOSIS — D8940 Mast cell activation, unspecified: Secondary | ICD-10-CM

## 2021-06-21 ENCOUNTER — Encounter: Payer: Self-pay | Admitting: Physical Therapy

## 2021-06-21 ENCOUNTER — Ambulatory Visit: Payer: 59 | Admitting: Physical Therapy

## 2021-06-21 DIAGNOSIS — M357 Hypermobility syndrome: Secondary | ICD-10-CM | POA: Diagnosis not present

## 2021-06-21 DIAGNOSIS — M2550 Pain in unspecified joint: Secondary | ICD-10-CM

## 2021-06-21 DIAGNOSIS — R2689 Other abnormalities of gait and mobility: Secondary | ICD-10-CM

## 2021-06-21 NOTE — Therapy (Signed)
?OUTPATIENT PHYSICAL THERAPY TREATMENT NOTE ? ? ?Patient Name: Shelby SinningKathleen M Harry-Oesterle ?MRN: [id number] ?DOB:08/10/1995, 26 y.o., female ?Today's Date: 06/21/2021 ? ?PCP: Vernell MorgansBovender, Margaret M, NP ?REFERRING PROVIDER: Myra RudeSchmitz, Jeremy E, MD ? ? PT End of Session - 06/21/21 1242   ? ? Visit Number 2   ? Number of Visits 16   ? Date for PT Re-Evaluation 08/07/21   ? Authorization Type BCBS   ? PT Start Time 1240   10 min late  ? PT Stop Time 1313   ? PT Time Calculation (min) 33 min   ? ?  ?  ? ?  ? ? ?Past Medical History:  ?Diagnosis Date  ? Anxiety   ? ?Past Surgical History:  ?Procedure Laterality Date  ? Tub place in both ear  Bilateral   ? When she was 26 years of age  ? WISDOM TOOTH EXTRACTION  2006  ? All 4  ? ?Patient Active Problem List  ? Diagnosis Date Noted  ? Eating disorder 06/13/2021  ? Hip pain 06/13/2021  ? 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 Dysautonomia orthostatic hypotension syndrome 05/17/2021  ? 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 IBS (irritable bowel syndrome) 04/03/2021  ? Dog bite, hand, right, initial encounter 06/25/2019  ? 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 ? ?PCP: Eulogio BearManor, James P, MD ?  ?REFERRING PROVIDER: Myra RudeSchmitz, Jeremy E, MD ? ?THERAPY DIAG:  ?Hypermobility syndrome ? ?Other abnormalities of gait and mobility ? ?Polyarthralgia ? ?PERTINENT HISTORY: Migraines, hypermobility  ?MCAS, tachycardia, IBS ?Her mother has EDS and her sister has POTS ? ?PRECAUTIONS: Other: POTS , positional changes.  Give seated rest breaks for standing, blood pools in LE  ? ?SUBJECTIVE: Last night I had horrible neck pain which turned into a migraine and sinus pain. Right now my left ankle is the worst 3/10. The other body parts that hurt today are my: neck, right sided spine, left hip, left knee, left ankle, left shoulder.  ? ?PAIN:  ?Are you having pain? Yes:  NPRS scale: 3/10 ?Pain location: generalized, neck today ?Pain description: achy ?Aggravating factors: activity. Back pain (standing long time) and carrying item in R hand ?Relieving factors: meds, heating pad, rest  ? ? ?OBJECTIVE:  ?  ?DIAGNOSTIC FINDINGS:  ?CT last yet ?  ?PATIENT SURVEYS:  ?[place] Impact Scale  given on eval  ?  ?COGNITION: ?           Overall cognitive status: Within functional limits for tasks assessed              ?            ?SENSATION: ?Reports hands, feet in the past but improved with med change  ?Amrs and hands sleeping AM frequently ?  ?MUSCLE LENGTH: ?Hamstrings: tight ?  ?POSTURE:  ?Forward head, rounded shoulders ?  ?PALPATION: ?NT ?  ?LUMBAR ROM:  ?  ?Active  A/PROM  ?06/12/2021  ?Flexion Palms to flor  ?Extension Hyper  ?Right lateral flexion    ?Left lateral flexion    ?Right rotation Hyper  ?Left rotation Hyper   ? (Blank rows = not tested) ?  ?LE ROM: ?  ?Active  Right ?06/12/2021 Left ?06/12/2021  ?Hip flexion      ?Hip extension      ?Hip abduction      ?Hip adduction      ?Hip internal rotation Rel tight  Rel tight   ?Hip external rotation WNL WNL  ?Knee flexion      ?Knee extension      ?Ankle dorsiflexion      ?Ankle plantarflexion      ?Ankle inversion      ?Ankle eversion      ? (Blank rows = not tested) ?  ?LE MMT: ?  ?MMT Right ?06/12/2021 Left ?06/12/2021  ?Hip flexion 4+ 4+  ?Hip extension 4- 4-  ?Hip abduction 4 4  ?Hip adduction      ?Hip internal rotation      ?Hip external rotation      ?Knee flexion 4+ 4  ?Knee extension 4+ 4+  ?Ankle dorsiflexion 4+ 4+  ?Ankle plantarflexion      ?Ankle inversion      ?Ankle eversion      ? (Blank rows = not tested) ?  ?FUNCTIONAL TESTS:  ?2 minute walk test: 412 feet , HR 120 max Sa02 99% ?  ?GAIT: ?Distance walked: 150 ?Assistive device utilized: None ?Level of assistance: Complete Independence ?Comments: none  ?  ?  ?  ?TODAY'S TREATMENT  ?Eval, intro, POC  ?  ?  ?PATIENT EDUCATION:  ?Education details: PT, POC ?Person educated:  Patient ?Education method: Explanation ?Education comprehension: verbalized understanding ?  ?  ?HOME EXERCISE PROGRAM: ?None yet  ?  ?ASSESSMENT: ?  ?CLINICAL IMPRESSION: ?Pt reports generalized joint pain at 3/10 or less with left ankle most painful. Today time spent with care activation, spinal positions, and  establishing HEP. She tolerated supine mat stabilization well today with min left hip popping during bent knee raises. After session she reported feeling abdominal soreness.  ? ?Patient is a 26 y.o. female who was seen today for physical therapy evaluation and treatment for hypermobility syndrome with dysautonomia.  HR tachycardia at rest 114. She has min to mod pain overall.  Hips are stable and she has decent strength.  She tolerated the session well with her therapy dog present, mainly for PTSD support.  She will benefit from skilled PT to include core strength and balance/proprioceptive work. ?  ?  ?OBJECTIVE IMPAIRMENTS cardiopulmonary status limiting activity, decreased activity tolerance, decreased balance, decreased coordination, decreased endurance, decreased mobility, difficulty walking, decreased ROM, decreased strength, dizziness, obesity, and pain.  ?  ?ACTIVITY LIMITATIONS cleaning, community activity, meal prep, occupation, laundry, yard work, and shopping.  ?  ?PERSONAL FACTORS Past/current experiences, Time since onset of injury/illness/exacerbation, and 3+ comorbidities: chronic joint pain, hypersomnelence, migraines, POTS, MCAS  are also affecting patient's functional outcome.  ?  ?  ?REHAB POTENTIAL: Excellent ?  ?CLINICAL DECISION MAKING: Stable/uncomplicated ?  ?EVALUATION COMPLEXITY: Low ?  ?  ?GOALS: ?Goals reviewed with patient? No ?  ?SHORT TERM GOALS: Target date: 07/03/2021 ?  ?Pt will be I with HEP for core, pelvic stability  ?Baseline: unknown ?Goal status: INITIAL ?  ?2.  Pt will complete Survey for hypermobility [facility] Impact Scale) and set goal.  ?Baseline: given on eval   ?Goal status: INITIAL ?  ?3.  Pt will report increased tolerance for work, home activities, reporting less fatigue overall with 1-2 tasks  ?Baseline: low energy , hard to do > 1 simple home task  ?Goal status: INITIAL ?  ?4  Pt will be screened for balance (DGI) and goal set  ?Baseline: NT on eval  ?Goal status: INITIAL ?  ?LONG TERM GOALS: Target date: 08/07/2021 ?  ?Pt will be able to walk 600 feet or more on the 2  min walk test to demo improved exercise tolerance  ?Baseline: 412 feet  ?Goal status: INITIAL ?  ?2.  Pt will understand and demo proper posture and body mechanics as it pertains to lifting and squatting for work, home tasks  ?Baseline: needs reinforcement ?Goal status: INITIAL ?  ?3.  Pt will be able to walk on uneven ground with improved confidence in balance  ?Baseline: loses balance  ?Goal status: INITIAL ?  ?4.  Pt will demo 5/5 strength in UE and LE for maximal joint support  ?Baseline: 4-/5 to 4+/5  ?Goal status: INITIAL ?  ?5.  Pt will be able to meet goals for balance based on results.  ?Baseline: TBA ?Goal status: INITIAL ?  ?  ?  ?  ?PLAN: ?PT FREQUENCY: 1x/week ?  ?PT DURATION: 8 weeks ?  ?PLANNED INTERVENTIONS: Therapeutic exercises, Therapeutic activity, Neuromuscular re-education, Balance training, Gait training, Patient/Family education, Joint mobilization, Aquatic Therapy, Electrical stimulation, Cryotherapy, Moist heat, Taping, and Manual therapy. ?  ?PLAN FOR NEXT SESSION: Review HEP ?  ? ? ? ?Jannette Spanner, PTA ?06/21/21 1:16 PM ?Phone: [phone] ?Fax: [phone]  ? ?   ?

## 2021-06-26 ENCOUNTER — Ambulatory Visit (HOSPITAL_BASED_OUTPATIENT_CLINIC_OR_DEPARTMENT_OTHER)
Admission: RE | Admit: 2021-06-26 | Discharge: 2021-06-26 | Disposition: A | Discharge status: Home / Self Care | Payer: 59 | Source: Ambulatory Visit | Attending: Cardiology | Admitting: Cardiology

## 2021-06-26 DIAGNOSIS — R000 Tachycardia, unspecified: Secondary | ICD-10-CM | POA: Diagnosis present

## 2021-06-26 DIAGNOSIS — M357 Hypermobility syndrome: Secondary | ICD-10-CM | POA: Diagnosis present

## 2021-06-26 DIAGNOSIS — I951 Orthostatic hypotension: Secondary | ICD-10-CM

## 2021-06-26 DIAGNOSIS — R0609 Other forms of dyspnea: Secondary | ICD-10-CM | POA: Diagnosis not present

## 2021-06-26 LAB — ECHOCARDIOGRAM COMPLETE
AR max vel: 2.79 cm2
AV Area VTI: 2.54 cm2
AV Area mean vel: 2.49 cm2
AV Mean grad: 4 mmHg
AV Peak grad: 6.4 mmHg
Ao pk vel: 1.26 m/s
Area-P 1/2: 4.93 cm2
S' Lateral: 3.2 cm

## 2021-06-26 NOTE — Progress Notes (Signed)
?  Echocardiogram ?2D Echocardiogram has been performed. ? ?Shelby Dyer ?06/26/2021, 9:22 AM ?

## 2021-06-29 ENCOUNTER — Ambulatory Visit: Payer: 59 | Admitting: Physical Therapy

## 2021-07-03 ENCOUNTER — Encounter: Payer: Self-pay | Admitting: Family Medicine

## 2021-07-04 ENCOUNTER — Other Ambulatory Visit: Payer: Self-pay | Admitting: Family Medicine

## 2021-07-04 DIAGNOSIS — K582 Mixed irritable bowel syndrome: Secondary | ICD-10-CM

## 2021-07-07 ENCOUNTER — Ambulatory Visit: Payer: 59 | Attending: Family Medicine | Admitting: Physical Therapy

## 2021-07-07 ENCOUNTER — Encounter: Payer: Self-pay | Admitting: Physical Therapy

## 2021-07-07 DIAGNOSIS — M2550 Pain in unspecified joint: Secondary | ICD-10-CM | POA: Diagnosis present

## 2021-07-07 DIAGNOSIS — R2689 Other abnormalities of gait and mobility: Secondary | ICD-10-CM | POA: Insufficient documentation

## 2021-07-07 DIAGNOSIS — M357 Hypermobility syndrome: Secondary | ICD-10-CM | POA: Diagnosis present

## 2021-07-07 NOTE — Therapy (Signed)
?OUTPATIENT PHYSICAL THERAPY TREATMENT NOTE ? ? ?Patient Name: Shelby Dyer ?MRN: [id number] ?DOB:September 26, 1995, 26 y.o., female ?Today's Date: 07/07/2021 ? ?PCP: Legrand Como, NP ?REFERRING PROVIDER: Rosemarie Ax, MD ? ? PT End of Session - 07/07/21 1154   ? ? Visit Number 3   ? Number of Visits 16   ? Date for PT Re-Evaluation 08/07/21   ? Authorization Type BCBS   ? PT Start Time 1150   ? PT Stop Time 1230   ? PT Time Calculation (min) 40 min   ? ?  ?  ? ?  ? ? ?Past Medical History:  ?Diagnosis Date  ? Anxiety   ? ?Past Surgical History:  ?Procedure Laterality Date  ? Tub place in both ear  Bilateral   ? When she was 26 years of age  ? WISDOM TOOTH EXTRACTION  2006  ? All 4  ? ?Patient Active Problem List  ? Diagnosis Date Noted  ? Eating disorder 06/13/2021  ? Hip pain 06/13/2021  ? 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 Dysautonomia orthostatic hypotension syndrome 05/17/2021  ? Mast cell activation ([place]) 05/17/2021  ? 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 IBS (irritable bowel syndrome) 04/03/2021  ? Dog bite, hand, right, initial encounter 06/25/2019  ? 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 ? ?PCP: Encarnacion Chu, MD ?  ?REFERRING PROVIDER: Rosemarie Ax, MD ? ?THERAPY DIAG:  ?Hypermobility syndrome ? ?Other abnormalities of gait and mobility ? ?Polyarthralgia ? ?PERTINENT HISTORY: Migraines, hypermobility  ?MCAS, tachycardia, IBS ?Her mother has EDS and her sister has POTS ? ?PRECAUTIONS: Other: POTS , positional changes.  Give seated rest breaks for standing, blood pools in LE  ? ?SUBJECTIVE: [place] the [place] and now I have lateral thigh pain I think it was from that. I was sore and tired after the zoo. I get twinges in the neck and always sore. The left ankle is still the worst. Left knee has little stabs of pain today. Shoulders are okay today. I guess I am as good as you can  get with EDS. The exercises are not that complicated and do not cause increased pain.  ? ?PAIN:  ?Are you having pain? Yes: NPRS scale: 4.5/10 ?Pain location: generalized, neck today ?Pain description: achy ?Aggravating factors: activity. Back pain (standing long time) and carrying item in R hand ?Relieving factors: meds, heating pad, rest  ? ? ?OBJECTIVE:  ?  ?DIAGNOSTIC FINDINGS:  ?CT last yet ?  ?PATIENT SURVEYS:  ?[place] Impact Scale  given on eval  ?  ?COGNITION: ?           Overall cognitive status: Within functional limits for tasks assessed              ?            ?SENSATION: ?Reports hands, feet in the past but improved with med change  ?Amrs and hands sleeping AM frequently ?  ?MUSCLE LENGTH: ?Hamstrings: tight ?  ?POSTURE:  ?Forward head, rounded shoulders ?  ?PALPATION: ?NT ?  ?LUMBAR ROM:  ?  ?Active  A/PROM  ?06/12/2021  ?Flexion Palms to flor  ?Extension Hyper  ?Right lateral flexion    ?Left lateral flexion    ?Right rotation Hyper  ?Left rotation Hyper   ? (Blank rows = not tested) ?  ?LE ROM: ?  ?Active  Right ?06/12/2021 Left ?06/12/2021  ?Hip  flexion      ?Hip extension      ?Hip abduction      ?Hip adduction      ?Hip internal rotation Rel tight Rel tight   ?Hip external rotation WNL WNL  ?Knee flexion      ?Knee extension      ?Ankle dorsiflexion      ?Ankle plantarflexion      ?Ankle inversion      ?Ankle eversion      ? (Blank rows = not tested) ?  ?LE MMT: ?  ?MMT Right ?06/12/2021 Left ?06/12/2021  ?Hip flexion 4+ 4+  ?Hip extension 4- 4-  ?Hip abduction 4 4  ?Hip adduction      ?Hip internal rotation      ?Hip external rotation      ?Knee flexion 4+ 4  ?Knee extension 4+ 4+  ?Ankle dorsiflexion 4+ 4+  ?Ankle plantarflexion      ?Ankle inversion      ?Ankle eversion      ? (Blank rows = not tested) ?  ?FUNCTIONAL TESTS:  ?2 minute walk test: 412 feet , HR 120 max Sa02 99% ?14.3 5 x STS ( 07/07/21) ?Tandem stance 6 sec RLE back (07/07/21) ?Tandem stance 8 sec LLE back  (07/07/21)  ?SLS 4-5 sec  best bilat  ?DGI (21/24) -taken 07/07/21 ?  ?GAIT: ?Distance walked: 150 ?Assistive device utilized: None ?Level of assistance: Complete Independence ?Comments: none  ?  ?Today's Treatment ?[facility] Adult PT Treatment:                                                DATE: 07/07/21 ?Therapeutic Exercise: ?Review of HEP ?Hooklying clam with green band- bilat and single  ?Banded bridge with green  ?Functional tasks/balance ?14.3 5 x STS ( 07/07/21) ?Tandem stance 6 sec RLE back (07/07/21) ?Tandem stance 8 sec LLE back  (07/07/21)  ?SLS 4-5 sec best bilat (07/07/21)  ?DGI (21/24) -taken 07/07/21 ? ?[facility] Adult PT Treatment:                                                DATE: 06/21/21 ?Therapeutic Exercise: ?Established HEP: articulating bridge, supine march, bent knee fall outs ? ?  ?Initial  TREATMENT  ?Eval, intro, POC  ?  ?  ?PATIENT EDUCATION:  ?Education details: HEP ?Person educated: Patient ?Education method: Explanation ?Education comprehension: verbalized understanding ?  ?  ?HOME EXERCISE PROGRAM: ?Access Code: [email or web address] ?URL: [email or web address] ?Date: 07/07/2021 ?Prepared by: Hessie Diener ? ?Exercises ?- Supine Bridge with Spinal Articulation  - 1 x daily - 7 x weekly - 2 sets - 10 reps ?- Bent Knee Fallouts with Alternating Legs  - 1 x daily - 7 x weekly - 2 sets - 10 reps ?- Supine March  - 1 x daily - 7 x weekly - 2 sets - 10 reps ?- Standing Tandem Balance with Counter Support  - 1 x daily - 7 x weekly - 1 sets - 3 reps - 30 hold ?- Standing Single Leg Stance with Counter Support  - 1 x daily - 7 x weekly - 1 sets - 3 reps - 30 hold ?  ?ASSESSMENT: ?  ?CLINICAL IMPRESSION: ?Pt reports generalized  joint pain at 4.5/10 possibly due to visiting the zoo 4 days ago. She reports compliance with HEP and no significant change in symptoms/ functional ability. Reviewed HEP and progressed with added resistance for mat stabilization.  She tolerated supine mat stabilization well today with min left hip popping.  Captured additional functional/balance baselines- see objective/treatment above. STG#1 met.  ? ? She will benefit from skilled PT to include core strength and balance/proprioceptive work. ?  ?  ?OBJECTIVE IMPAIRMENTS cardiopulmonary status limiting activity, decreased activity tolerance, decreased balance, decreased coordination, decreased endurance, decreased mobility, difficulty walking, decreased ROM, decreased strength, dizziness, obesity, and pain.  ?  ?ACTIVITY LIMITATIONS cleaning, community activity, meal prep, occupation, laundry, yard work, and shopping.  ?  ?PERSONAL FACTORS Past/current experiences, Time since onset of injury/illness/exacerbation, and 3+ comorbidities: chronic joint pain, hypersomnelence, migraines, POTS, MCAS  are also affecting patient's functional outcome.  ?  ?  ?REHAB POTENTIAL: Excellent ?  ?CLINICAL DECISION MAKING: Stable/uncomplicated ?  ?EVALUATION COMPLEXITY: Low ?  ?  ?GOALS: ?Goals reviewed with patient? No ?  ?SHORT TERM GOALS: Target date: 07/03/2021 ?  ?Pt will be I with HEP for core, pelvic stability  ?Baseline: unknown ?Goal status: MET ?  ?2.  Pt will complete Survey for hypermobility [facility] Impact Scale) and set goal.  ?Baseline: given on eval  ?Goal status: ONGOING ?  ?3.  Pt will report increased tolerance for work, home activities, reporting less fatigue overall with 1-2 tasks  ?Baseline: low energy , hard to do > 1 simple home task  ?Goal status: ONGOING ?  ?4  Pt will be screened for balance (DGI) and goal set  ?Baseline: NT on eval  ?Goal status: ONGOING ?  ?LONG TERM GOALS: Target date: 08/07/2021 ?  ?Pt will be able to walk 600 feet or more on the 2 min walk test to demo improved exercise tolerance  ?Baseline: 412 feet  ?Goal status: INITIAL ?  ?2.  Pt will understand and demo proper posture and body mechanics as it pertains to lifting and squatting for work, home tasks  ?Baseline: needs reinforcement ?Goal status: INITIAL ?  ?3.  Pt will be able to walk on  uneven ground with improved confidence in balance  ?Baseline: loses balance  ?Goal status: INITIAL ?  ?4.  Pt will demo 5/5 strength in UE and LE for maximal joint support  ?Baseline: 4-/5 to 4+/5  ?Goal

## 2021-07-11 ENCOUNTER — Ambulatory Visit: Payer: 59 | Admitting: Physical Therapy

## 2021-07-11 ENCOUNTER — Encounter: Payer: Self-pay | Admitting: Physical Therapy

## 2021-07-11 DIAGNOSIS — R2689 Other abnormalities of gait and mobility: Secondary | ICD-10-CM

## 2021-07-11 DIAGNOSIS — M357 Hypermobility syndrome: Secondary | ICD-10-CM

## 2021-07-11 DIAGNOSIS — M2550 Pain in unspecified joint: Secondary | ICD-10-CM

## 2021-07-11 NOTE — Therapy (Signed)
?OUTPATIENT PHYSICAL THERAPY TREATMENT NOTE ? ? ?Patient Name: Shelby Dyer ?MRN: [id number] ?DOB:09-Aug-1995, 26 y.o., female ?Today's Date: 07/11/2021 ? ?PCP: Legrand Como, NP ?REFERRING PROVIDER: Encarnacion Chu, MD ? ? PT End of Session - 07/11/21 1155   ? ? Visit Number 4   ? Number of Visits 16   ? Date for PT Re-Evaluation 08/07/21   ? Authorization Type BCBS   ? PT Start Time 1155   ? PT Stop Time 1233   ? PT Time Calculation (min) 38 min   ? Activity Tolerance Patient tolerated treatment well   ? Behavior During Therapy [facility] for tasks assessed/performed   ? ?  ?  ? ?  ? ? ? ?Past Medical History:  ?Diagnosis Date  ? Anxiety   ? ?Past Surgical History:  ?Procedure Laterality Date  ? Tub place in both ear  Bilateral   ? When she was 26 years of age  ? WISDOM TOOTH EXTRACTION  2006  ? All 4  ? ?Patient Active Problem List  ? Diagnosis Date Noted  ? Eating disorder 06/13/2021  ? Hip pain 06/13/2021  ? 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 Dysautonomia orthostatic hypotension syndrome 05/17/2021  ? Mast cell activation ([place]) 05/17/2021  ? 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 IBS (irritable bowel syndrome) 04/03/2021  ? Dog bite, hand, right, initial encounter 06/25/2019  ? 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 ? ?PCP: Encarnacion Chu, MD ?  ?REFERRING PROVIDER: Rosemarie Ax, MD ? ?THERAPY DIAG:  ?Hypermobility syndrome ? ?Other abnormalities of gait and mobility ? ?Polyarthralgia ? ?PERTINENT HISTORY: Migraines, hypermobility  ?MCAS, tachycardia, IBS ?Her mother has EDS and her sister has POTS ? ?PRECAUTIONS: Other: POTS , positional changes.  Give seated rest breaks for standing, blood pools in LE  ? ?SUBJECTIVE: I am doing OK.  My L knee hurts.  Mid back as well , min to moderate.   ?PAIN:  ?Are you having pain? Yes: NPRS scale: note rated today.  ?Pain location: generalized, neck today ?Pain  description: achy ?Aggravating factors: activity. Back pain (standing long time) and carrying item in R hand ?Relieving factors: meds, heating pad, rest  ? ? ?OBJECTIVE:  ?  ?DIAGNOSTIC FINDINGS:  ?CT last yet ?  ?PATIENT SURVEYS:  ?[place] Impact Scale  given on eval  ?  ?COGNITION: ?           Overall cognitive status: Within functional limits for tasks assessed              ?            ?SENSATION: ?Reports hands, feet in the past but improved with med change  ?Amrs and hands sleeping AM frequently ?  ?MUSCLE LENGTH: ?Hamstrings: tight ?  ?POSTURE:  ?Forward head, rounded shoulders ?  ?PALPATION: ?NT ?  ?LUMBAR ROM:  ?  ?Active  A/PROM  ?06/12/2021  ?Flexion Palms to flor  ?Extension Hyper  ?Right lateral flexion    ?Left lateral flexion    ?Right rotation Hyper  ?Left rotation Hyper   ? (Blank rows = not tested) ?  ?LE ROM: ?  ?Active  Right ?06/12/2021 Left ?06/12/2021  ?Hip flexion      ?Hip extension      ?Hip abduction      ?Hip adduction      ?Hip internal rotation Rel tight Rel tight   ?Hip external rotation WNL  WNL  ?Knee flexion      ?Knee extension      ?Ankle dorsiflexion      ?Ankle plantarflexion      ?Ankle inversion      ?Ankle eversion      ? (Blank rows = not tested) ?  ?LE MMT: ?  ?MMT Right ?06/12/2021 Left ?06/12/2021  ?Hip flexion 4+ 4+  ?Hip extension 4- 4-  ?Hip abduction 4 4  ?Hip adduction      ?Hip internal rotation      ?Hip external rotation      ?Knee flexion 4+ 4  ?Knee extension 4+ 4+  ?Ankle dorsiflexion 4+ 4+  ?Ankle plantarflexion      ?Ankle inversion      ?Ankle eversion      ? (Blank rows = not tested) ?  ?FUNCTIONAL TESTS:  ?2 minute walk test: 412 feet , HR 120 max Sa02 99% ?14.3 5 x STS ( 07/07/21) ?Tandem stance 6 sec RLE back (07/07/21) ?Tandem stance 8 sec LLE back  (07/07/21)  ?SLS 4-5 sec best bilat  ?DGI (21/24) -taken 07/07/21 ?  ?GAIT: ?Distance walked: 150 ?Assistive device utilized: None ?Level of assistance: Complete Independence ?Comments: none  ?  ?Today's  Treatment ? ? ?[facility] Adult PT Treatment:                                                DATE: 07/11/21 ?Therapeutic Exercise: ?Pilates Tower for LE/Core strength, postural strength, lumbopelvic disassociation and core control.  Exercises included: ?Isometric Tr A and breathing  x 10 ?Bridge with ball squeeze x 10  ?Supine Arm Springs x 10 with ball squeeze ?Supine arms with alternating knee lift x 10 ?Bridge with Arm springs  x 10  ?Supine leg springs yellow single leg arcs and hip circles  ?Double leg arcs in parallel and turnout ?Double hip circles, small ROM x 5 each direction  ?Self Care: ?Knee proprioception with transition to walking from sitting   ? ?[facility] Adult PT Treatment:                                                DATE: 07/07/21 ?Therapeutic Exercise: ?Review of HEP ?Hooklying clam with green band- bilat and single  ?Banded bridge with green  ?Functional tasks/balance ?14.3 5 x STS ( 07/07/21) ?Tandem stance 6 sec RLE back (07/07/21) ?Tandem stance 8 sec LLE back  (07/07/21)  ?SLS 4-5 sec best bilat (07/07/21)  ?DGI (21/24) -taken 07/07/21 ? ?[facility] Adult PT Treatment:                                                DATE: 06/21/21 ?Therapeutic Exercise: ?Established HEP: articulating bridge, supine march, bent knee fall outs ? ?  ?Initial  TREATMENT  ?Eval, intro, POC  ?  ?  ?PATIENT EDUCATION:  ?Education details: HEP ?Person educated: Patient ?Education method: Explanation ?Education comprehension: verbalized understanding ?  ?  ?HOME EXERCISE PROGRAM: ?Access Code: [email or web address] ?URL: [email or web address] ?Date: 07/07/2021 ?Prepared by: Hessie Diener ? ?Exercises ?- Supine Bridge with Spinal Articulation  - 1 x  daily - 7 x weekly - 2 sets - 10 reps ?- Bent Knee Fallouts with Alternating Legs  - 1 x daily - 7 x weekly - 2 sets - 10 reps ?- Supine March  - 1 x daily - 7 x weekly - 2 sets - 10 reps ?- Standing Tandem Balance with Counter Support  - 1 x daily - 7 x weekly - 1 sets - 3 reps - 30 hold ?-  Standing Single Leg Stance with Counter Support  - 1 x daily - 7 x weekly - 1 sets - 3 reps - 30 hold ?  ?ASSESSMENT: ?  ?CLINICAL IMPRESSION: ?Patient was introduced to Pilates apparatus today and did well.  Kept exercises simple as she was very sore after last visit.  She should expect to be sore today with the addition of springs. Discussed the knee joint and how/why it feels like when she goes to walk or especially go down stairs she feels like her knee may buckle or like is "not her leg".  (Proprioception and instability )  ?  ?OBJECTIVE IMPAIRMENTS cardiopulmonary status limiting activity, decreased activity tolerance, decreased balance, decreased coordination, decreased endurance, decreased mobility, difficulty walking, decreased ROM, decreased strength, dizziness, obesity, and pain.  ?  ?ACTIVITY LIMITATIONS cleaning, community activity, meal prep, occupation, laundry, yard work, and shopping.  ?  ?PERSONAL FACTORS Past/current experiences, Time since onset of injury/illness/exacerbation, and 3+ comorbidities: chronic joint pain, hypersomnelence, migraines, POTS, MCAS  are also affecting patient's functional outcome.  ?  ?  ?REHAB POTENTIAL: Excellent ?  ?CLINICAL DECISION MAKING: Stable/uncomplicated ?  ?EVALUATION COMPLEXITY: Low ?  ?  ?GOALS: ?Goals reviewed with patient? No ?  ?SHORT TERM GOALS: Target date: 07/03/2021 ?  ?Pt will be I with HEP for core, pelvic stability  ?Baseline: unknown ?Goal status: MET ?  ?2.  Pt will complete Survey for hypermobility [facility] Impact Scale) and set goal.  ?Baseline: given on eval  ?Goal status: ONGOING ?  ?3.  Pt will report increased tolerance for work, home activities, reporting less fatigue overall with 1-2 tasks  ?Baseline: low energy , hard to do > 1 simple home task  ?Goal status: ONGOING ?  ?4  Pt will be screened for balance (DGI) and goal set  ?Baseline: NT on eval  ?Goal status: ONGOING ?  ?LONG TERM GOALS: Target date: 08/07/2021 ?  ?Pt will be able to  walk 600 feet or more on the 2 min walk test to demo improved exercise tolerance  ?Baseline: 412 feet  ?Goal status: INITIAL ?  ?2.  Pt will understand and demo proper posture and body mechanics as it pertains to [place]

## 2021-07-17 ENCOUNTER — Encounter: Payer: Self-pay | Admitting: Gastroenterology

## 2021-07-17 ENCOUNTER — Ambulatory Visit (INDEPENDENT_AMBULATORY_CARE_PROVIDER_SITE_OTHER): Payer: BLUE CROSS/BLUE SHIELD | Admitting: Gastroenterology

## 2021-07-17 VITALS — BP 122/80 | HR 96 | Ht 69.0 in | Wt 247.0 lb

## 2021-07-17 DIAGNOSIS — R634 Abnormal weight loss: Secondary | ICD-10-CM

## 2021-07-17 DIAGNOSIS — R194 Change in bowel habit: Secondary | ICD-10-CM

## 2021-07-17 DIAGNOSIS — R110 Nausea: Secondary | ICD-10-CM

## 2021-07-17 DIAGNOSIS — R630 Anorexia: Secondary | ICD-10-CM

## 2021-07-17 DIAGNOSIS — K5900 Constipation, unspecified: Secondary | ICD-10-CM

## 2021-07-17 DIAGNOSIS — R6881 Early satiety: Secondary | ICD-10-CM

## 2021-07-17 MED ORDER — ONDANSETRON HCL 4 MG PO TABS: 4.0000 mg | ORAL_TABLET | Freq: Four times a day (QID) | ORAL | 0 refills | Status: DC | PRN

## 2021-07-17 MED ORDER — CLENPIQ 10-3.5-12 MG-GM -GM/160ML PO SOLN: 1.0000 | ORAL | 0 refills | Status: DC

## 2021-07-17 NOTE — Patient Instructions (Addendum)
If you are age 26 or younger, your body mass index should be between 19-25. Your Body mass index is 36.48 kg/m?Marland Kitchen If this is out of the aformentioned range listed, please consider follow up with your Primary Care Provider.  ? ?__________________________________________________________ ? ?The [facility] providers would like to encourage you to use [facility] to communicate with providers for non-urgent requests or questions.  Due to long hold times on the telephone, sending your provider a message by [facility] may be a faster and more efficient way to get a response.  Please allow 48 business hours for a response.  Please remember that this is for non-urgent requests.  ? ?Due to recent changes in healthcare laws, you may see the results of your imaging and laboratory studies on MyChart before your provider has had a chance to review them.  We understand that in some cases there may be results that are confusing or concerning to you. Not all laboratory results come back in the same time frame and the provider may be waiting for multiple results in order to interpret others.  Please give [place] 48 hours in order for your provider to thoroughly review all the results before contacting the office for clarification of your results.  ? ?We have sent the following medications to your pharmacy for you to pick up at your convenience: Zofran, clenpiq ? ?Please purchase the following medications over the counter and take as directed: miralax, ducolax ?You have been scheduled for an endoscopy and colonoscopy. Please follow the written instructions given to you at your visit today. ? ?Please pick up your prep supplies at the pharmacy within the next 1-3 days. ?If you use inhalers (even only as needed), please bring them with you on the day of your procedure.  ? ? ?Thank you for choosing me and [facility]. ? ?Doristine Locks, D.O. ? ?We want to thank you for trusting  [facility] with your care. All of  our staff and providers value the relationships we have built with our patients, and it is an honor to care for you.  ? ?We are writing to let you know that [facility] will close on Aug 07, 2021, and we invite you to continue to see Dr. Edman Circle and Doristine Locks at the [facility] [place] office location. We are consolidating our serices at these [facility] practices to better provide care. Our office staff will work with you to ensure a seamless transition.  ? ?Doristine Locks, DO -Dr. Barron Alvine will be movig to [facility] at [address] [place], [place] [address], effective Aug 07, 2021.  Contact [phone] to schedule an appointment with him.  ? ?Edman Circle, MD- Dr. Chales Abrahams will be movig to [facility] at [address] [place], [place] [address], effective Aug 07, 2021.  Contact [phone] to schedule an appointment with him.  ? ?Requesting Medical Records ?If you need to request your medical records, please follow the instructions below. Your medical records are confidential, and a copy can be transferred to another provider or released to you or another person you designate only with your permission. ? ?There are several ways to request your medical records: ?Requests for medical records can be submitted through our practice.   ?You can also request your records electronically, in your MyChart account by selecting the ?Request Health Records? tab.  ?If you need additional information on how to request records, please go to [email or web address], choose Patient Information, then select  Request Medical Records. ?To make an appointment or if you have any questions about your health care needs, please contact our office at [phone] and one of our staff members will be glad to assist you. ?[facility] is committed to providing exceptional care for you and our community. Thank you for allowing [place] to serve your health care  needs. ?Sincerely, ? ?Trixie Dredge, Director [facility] ?[facility] also offers convenient virtual care options. Sore throat? Sinus problems? Cold or flu symptoms? Get care from the comfort of home with [facility] Video Visits and e-Visits. Learn more about the non-emergency conditions treated and start your virtual visit at [email or web address] ? ? ?

## 2021-07-17 NOTE — Progress Notes (Signed)
? ? ?          ?Chief Complaint: Constipation, decreased appetite, weight loss ? ? ?Referring Provider:     Clare Gandy, MD ? ? ?HPI:   ? ? ?Shelby Dyer is a 26 y.o. 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 ? ?She states she has been previously diagnosed with IBS mixed type, mainly managed with on-demand therapy in the past.  More recently, has been having nausea and decreased appetite.  +early satiety. Reports losing 18# over the last 3 weeks or so. Nausea started in November, then decreased appetite ~3-4 weeks ago with associated nausea. No emesis, but does have regurgitation. No dysphagia. Recently started OTC omeprazole.  ? 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 Scheduled to see Allergy Clinic at [facility] in June.  ? ?Longer history of alternating constipation/diarrhea. Tends to have more constipation than diarrhea. Was prescribed hyoscyamine prn (takes prn diarrhea, not for cramping). Will use Miralax prn constipation, then dulcolax if Miralax not efficacious. No hemtochezia or melena.  ? ? ?Was recently evaluated in the Cardiology Clinic for possible POTS syndrome, inappropriate sinus tachycardia syndrome.  Evaluated with Zio patch, echocardiogram (normal).  Normal Holter monitor in 12/2018. ? ? ?- 04/07/2021: Vitamin D 20.4.  Normal CBC, CMP (CO2 18). ? ?No prior EGD/Colonoscopy.  ? ?Mother with IBS. No known family history of CRC, GI malignancy, liver disease, pancreatic disease, or IBD.  ? ? ? ?Past Medical History:  ?Diagnosis Date  ? Anxiety   ? ? ? ?Past Surgical History:  ?Procedure Laterality Date  ? Tub place in both ear  Bilateral   ? When she was 26 years of age  ? WISDOM TOOTH EXTRACTION  2006  ? All 4   ? ?Family History  ?Problem Relation Age of Onset  ? Atrial fibrillation Father   ? Other Sister   ? Hypertension Brother   ? Breast cancer Paternal Grandmother   ? Colon cancer Neg Hx   ? Stomach cancer Neg Hx   ? Liver cancer Neg Hx   ? Pancreatic cancer Neg Hx   ? ?Social History  ? ?Tobacco Use  ? Smoking status: Never  ?Vaping Use  ? Vaping Use: Never used  ?Substance Use Topics  ? Alcohol use: No  ? Drug use: Never  ? ?Current Outpatient Medications  ?Medication Sig Dispense Refill  ? ALPRAZolam (XANAX XR) 0.5 MG 24 hr tablet Take 0.5 mg by mouth as needed for anxiety.    ? cetirizine (ZYRTEC) 10 MG tablet Take 20 mg by mouth daily.    ? EPINEPHrine 0.3 mg/0.3 mL IJ SOAJ injection Inject 0.3 mg into the muscle as needed for anaphylaxis.    ? famotidine (PEPCID) 40 MG tablet Take 40 mg by mouth 2 (two) times daily.    ? fexofenadine (ALLEGRA) 180 MG tablet Take 180 mg by mouth as needed for allergies or rhinitis.    ? hydrOXYzine (ATARAX) 25 MG tablet Take 25-50 mg by mouth every 8 (eight) hours as needed for anxiety.    ? hyoscyamine (LEVSIN) 0.125 MG/ML solution Take 0.125 mg by mouth every 4 (four) hours as needed for bladder spasms.    ? levonorgestrel (MIRENA) 20 MCG/24HR IUD 1 each by Intrauterine route once.    ?  metoprolol tartrate (LOPRESSOR) 25 MG tablet Take 25 mg by mouth 2 (two) times daily.    ? Multiple Vitamin (MULTIVITAMIN) tablet Take 1 tablet by mouth daily.    ? nortriptyline (PAMELOR) 25 MG capsule Take 25-50 mg by mouth daily after supper.    ? ?No current facility-administered medications for this visit.  ? ?Allergies  ?Allergen Reactions  ? Latex Anaphylaxis and Hives  ? ? ? ?Review of Systems: ?All systems reviewed and negative except where noted in HPI.  ? ? ? ?Physical Exam:   ? ?Wt Readings from Last 3 Encounters:  ?07/17/21 247 lb (112 kg)  ?06/13/21 262 lb (118.8 kg)  ?05/17/21 263 lb (119.3 kg)  ? ? ?Pulse 96   Ht 5\' 9"  (1.753 m)   Wt 247 lb (112 kg)   SpO2 99%   BMI 36.48  kg/m?  ?Constitutional:  Pleasant, in no acute distress. ?Psychiatric: Normal mood and affect. Behavior is normal. ?Cardiovascular: Normal rate, regular rhythm. No edema ?Pulmonary/chest: Effort normal and breath sounds normal. No wheezing, rales or rhonchi. ?Abdominal: Soft, nondistended, nontender. Bowel sounds active throughout. There are no masses palpable. No hepatomegaly. ?Neurological: Alert and oriented to person place and time. ?Skin: Skin is warm and dry. No rashes noted. ? ? ?ASSESSMENT AND PLAN;  ? ?1) Decreased appetite ?2) Early satiety ?3) Nausea ?4) Weight loss ?- EGD to evaluate for mucosal/luminal pathology with biopsies as appropriate ?- Random and directed biopsies particular given allergy work-up ?- Has appointment at [facility] in June ?- If EGD unrevealing, plan for Gastric Emptying Study and cross-sectional imaging ? ? ?5) Change in bowel habits ?6) Constipation ?- Colonoscopy with random and directed biopsies to evaluate for mucosal/luminal pathology ?- 2-day bowel preparation ?- Provided Rx for Zofran to take scheduled Q6H starting 2 days prior to colonoscopy and continues through bowel prep period ? ?7) Vitamin D deficiency ?- Evaluate for e/o villous atrophy at time of EGD as above ? ?The indications, risks, and benefits of EGD and colonoscopy were explained to the patient in detail. Risks include but are not limited to bleeding, perforation, adverse reaction to medications, and cardiopulmonary compromise. Sequelae include but are not limited to the possibility of surgery, hospitalization, and mortality. The patient verbalized understanding and wished to proceed. All questions answered, referred to scheduler and bowel prep ordered. Further recommendations pending results of the exam.  ? ? ? ?July, DO, FACG  07/17/2021, 9:41 AM ? ? ?[place], 07/19/2021, NP ? ?

## 2021-07-20 ENCOUNTER — Ambulatory Visit: Payer: 59 | Admitting: Physical Therapy

## 2021-07-20 DIAGNOSIS — M357 Hypermobility syndrome: Secondary | ICD-10-CM

## 2021-07-20 DIAGNOSIS — M2550 Pain in unspecified joint: Secondary | ICD-10-CM

## 2021-07-20 DIAGNOSIS — R2689 Other abnormalities of gait and mobility: Secondary | ICD-10-CM

## 2021-07-20 NOTE — Therapy (Signed)
?OUTPATIENT PHYSICAL THERAPY TREATMENT NOTE ? ? ?Patient Name: Shelby Dyer ?MRN: [id number] ?DOB:[birth year], 26 y.o., female ?Today's Date: 07/20/2021 ? ?PCP: Legrand Como, NP ?REFERRING PROVIDER: Legrand Como, NP ? ? PT End of Session - 07/20/21 1238   ? ? Visit Number 5   ? Number of Visits 16   ? Date for PT Re-Evaluation 08/07/21   ? Authorization Type BCBS   ? PT Start Time 1235   ? PT Stop Time 1318   ? PT Time Calculation (min) 43 min   ? Activity Tolerance Patient tolerated treatment well   ? Behavior During Therapy [facility] for tasks assessed/performed   ? ?  ?  ? ?  ? ? ? ?Past Medical History:  ?Diagnosis Date  ? Anxiety   ? ?Past Surgical History:  ?Procedure Laterality Date  ? Tub place in both ear  Bilateral   ? When she was 26 years of age  ? WISDOM TOOTH EXTRACTION  2006  ? All 4  ? ?Patient Active Problem List  ? Diagnosis Date Noted  ? Eating disorder 06/13/2021  ? Hip pain 06/13/2021  ? 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 Dysautonomia orthostatic hypotension syndrome 05/17/2021  ? 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 IBS (irritable bowel syndrome) 04/03/2021  ? Dog bite, hand, right, initial encounter 06/25/2019  ? 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 ? ?PCP: Encarnacion Chu, MD ?  ?REFERRING PROVIDER: Rosemarie Ax, MD ? ?THERAPY DIAG:  ?Hypermobility syndrome ? ?Other abnormalities of gait and mobility ? ?Polyarthralgia ? ?PERTINENT HISTORY: Migraines, hypermobility  ?MCAS, tachycardia, IBS ?Her mother has EDS and her sister has POTS ? ?PRECAUTIONS: Other: POTS , positional changes.  Give seated rest breaks for standing, blood pools in LE  ? ?SUBJECTIVE: Rt elbow and Rt shoulder and neck always hurts.  Was sore after last time.   ?PAIN:  ?Are you having pain? Yes:  ?NPRS scale: 4/10 rated today.  ?Pain location: generalized, neck  today ?Pain description: achy ?Aggravating factors: activity. Back pain (standing long time) and carrying item in R hand ?Relieving factors: meds, heating pad, rest  ? ? ?OBJECTIVE:  ?  ?DIAGNOSTIC FINDINGS:  ?CT last yet ?  ?PATIENT SURVEYS:  ?[place] Impact Scale  given on eval  ?  ?COGNITION: ?           Overall cognitive status: Within functional limits for tasks assessed              ?            ?SENSATION: ?Reports hands, feet in the past but improved with med change  ?Amrs and hands sleeping AM frequently ?  ?MUSCLE LENGTH: ?Hamstrings: tight ?  ?POSTURE:  ?Forward head, rounded shoulders ?  ?PALPATION: ?NT ?  ?LUMBAR ROM:  ?  ?Active  A/PROM  ?06/12/2021  ?Flexion Palms to floor  ?Extension Hyper  ?Right lateral flexion    ?Left lateral flexion    ?Right rotation Hyper  ?Left rotation Hyper   ? (Blank rows = not tested) ?  ? ?CERVICAL ROM: 07/20/21 ?Extension 75 deg ?Flexion 40 capital flexion , 60 cervical flexion  ?Rt sidebending 55 ?Lt sidebending 49 ?Rt rot 75  ?Lt. rot 82 ?Pain minimal with AROM of C-spine, senses clicking with extension  ?LE ROM: ?  ?Active  Right ?06/12/2021 Left ?06/12/2021  ?Hip flexion      ?  Hip extension      ?Hip abduction      ?Hip adduction      ?Hip internal rotation Rel tight Rel tight   ?Hip external rotation WNL WNL  ?Knee flexion      ?Knee extension      ?Ankle dorsiflexion      ?Ankle plantarflexion      ?Ankle inversion      ?Ankle eversion      ? (Blank rows = not tested) ?  ?LE MMT: ?  ?MMT Right ?06/12/2021 Left ?06/12/2021  ?Hip flexion 4+ 4+  ?Hip extension 4- 4-  ?Hip abduction 4 4  ?Hip adduction      ?Hip internal rotation      ?Hip external rotation      ?Knee flexion 4+ 4  ?Knee extension 4+ 4+  ?Ankle dorsiflexion 4+ 4+  ?Ankle plantarflexion      ?Ankle inversion      ?Ankle eversion      ? (Blank rows = not tested) ?  ?FUNCTIONAL TESTS:  ?2 minute walk test: 412 feet , HR 120 max Sa02 99% ?14.3 5 x STS ( 07/07/21) ?Tandem stance 6 sec RLE back (07/07/21) ?Tandem  stance 8 sec LLE back  (07/07/21)  ?SLS 4-5 sec best bilat  ?DGI (21/24) -taken 07/07/21 ?  ?GAIT: ?Distance walked: 150 ?Assistive device utilized: None ?Level of assistance: Complete [place] ?Comments: none  ?  ?Today's Treatment ? ?[facility] Adult PT Treatment:                                                DATE: 07/20/21 ?Therapeutic Exercise: Scapular, cervical, lumbopelvic stability  ?Cervical retraction x 10 used blue soft ball  ?Added slight rotation x 10 each side  ?Supine Tr A with march  then SLR x 10 each side  ?Arm lift with SLR for neutral core x 10  ?Narrow grip looped band overhead lift x 15  ?ER green looped band small ROM x 10  ?Ball squeeze x 10  ?Ball squeeze with bridging x 10 ?Feet on ball for destabilization , bent knee fall out x 10  ?Self Care: ?AROM of cervical spine and comparison to norms ?  ?[facility] Adult PT Treatment:                                                DATE: 07/11/21 ?Therapeutic Exercise: ?Pilates Tower for LE/Core strength, postural strength, lumbopelvic disassociation and core control.  Exercises included: ?Isometric Tr A and breathing  x 10 ?Bridge with ball squeeze x 10  ?Supine Arm Springs x 10 with ball squeeze ?Supine arms with alternating knee lift x 10 ?Bridge with Arm springs  x 10  ?Supine leg springs yellow single leg arcs and hip circles  ?Double leg arcs in parallel and turnout ?Double hip circles, small ROM x 5 each direction  ?Self Care: ?Knee proprioception with transition to walking from sitting   ? ?[facility] Adult PT Treatment:  DATE: 07/07/21 ?Therapeutic Exercise: ?Review of HEP ?Hooklying clam with green band- bilat and single  ?Banded bridge with green  ?Functional tasks/balance ?14.3 5 x STS ( 07/07/21) ?Tandem stance 6 sec RLE back (07/07/21) ?Tandem stance 8 sec LLE back  (07/07/21)  ?SLS 4-5 sec best bilat (07/07/21)  ?DGI (21/24) -taken 07/07/21 ? ?[facility] Adult PT Treatment:                                                 DATE: 06/21/21 ?Therapeutic Exercise: ?Established HEP: articulating bridge, supine march, bent knee fall outs ? ?  ?Initial  TREATMENT  ?Eval, intro, POC  ?  ?  ?PATIENT EDUCATION:  ?Education details: HEP ?Person educated: Patient ?Education method: Explanation ?Education comprehension: verbalized understanding ?  ?  ?HOME EXERCISE PROGRAM: ?Access Code: [email or web address] ?URL: [email or web address] ?Date: 07/07/2021 ?Prepared by: Hessie Diener ? ?Exercises ?- Supine Bridge with Spinal Articulation  - 1 x daily - 7 x weekly - 2 sets - 10 reps ?- Bent Knee Fallouts with Alternating Legs  - 1 x daily - 7 x weekly - 2 sets - 10 reps ?- Supine March  - 1 x daily - 7 x weekly - 2 sets - 10 reps ?- Standing Tandem Balance with Counter Support  - 1 x daily - 7 x weekly - 1 sets - 3 reps - 30 hold ?- Standing Single Leg Stance with Counter Support  - 1 x daily - 7 x weekly - 1 sets - 3 reps - 30 hold ? shoulder added  ?ASSESSMENT: ?  ?CLINICAL IMPRESSION: ?Pt continues to tolerate stability exercises today without increased pain . Hips fatigued and uncomfortable with repeated extension.  ?  ?OBJECTIVE IMPAIRMENTS cardiopulmonary status limiting activity, decreased activity tolerance, decreased balance, decreased coordination, decreased endurance, decreased mobility, difficulty walking, decreased ROM, decreased strength, dizziness, obesity, and pain.  ?  ?ACTIVITY LIMITATIONS cleaning, community activity, meal prep, occupation, laundry, yard work, and shopping.  ?  ?PERSONAL FACTORS Past/current experiences, Time since onset of injury/illness/exacerbation, and 3+ comorbidities: chronic joint pain, hypersomnelence, migraines, POTS, MCAS  are also affecting patient's functional outcome.  ?  ?  ?REHAB POTENTIAL: Excellent ?  ?CLINICAL DECISION MAKING: Stable/uncomplicated ?  ?EVALUATION COMPLEXITY: Low ?  ?  ?GOALS: ?Goals reviewed with patient? No ?  ?SHORT TERM GOALS: Target date: 07/03/2021 ?  ?Pt will be I with HEP  for core, pelvic stability  ?Baseline: unknown ?Goal status: MET ?  ?2.  Pt will complete Survey for hypermobility [facility] Impact Scale) and set goal.  ?Baseline: given on eval  ?Goal status: ONGOING

## 2021-07-23 ENCOUNTER — Encounter: Payer: Self-pay | Admitting: Certified Registered Nurse Anesthetist

## 2021-07-24 ENCOUNTER — Ambulatory Visit: Payer: 59 | Admitting: Physical Therapy

## 2021-07-27 ENCOUNTER — Telehealth: Payer: Self-pay

## 2021-07-27 NOTE — Telephone Encounter (Signed)
-----   Message from Georgeanna Lea, MD sent at 07/26/2021  9:20 PM EDT ----- ?Normal monitor without any significant arrhythmia ?

## 2021-07-27 NOTE — Telephone Encounter (Signed)
Patient notified of results.

## 2021-07-28 ENCOUNTER — Ambulatory Visit (AMBULATORY_SURGERY_CENTER): Payer: 59 | Admitting: Gastroenterology

## 2021-07-28 ENCOUNTER — Encounter: Payer: Self-pay | Admitting: Gastroenterology

## 2021-07-28 VITALS — BP 116/70 | HR 75 | Temp 97.5°F | Resp 16 | Ht 69.0 in | Wt 247.0 lb

## 2021-07-28 DIAGNOSIS — R630 Anorexia: Secondary | ICD-10-CM

## 2021-07-28 DIAGNOSIS — Z538 Procedure and treatment not carried out for other reasons: Secondary | ICD-10-CM

## 2021-07-28 DIAGNOSIS — R110 Nausea: Secondary | ICD-10-CM | POA: Diagnosis not present

## 2021-07-28 DIAGNOSIS — R194 Change in bowel habit: Secondary | ICD-10-CM

## 2021-07-28 DIAGNOSIS — K644 Residual hemorrhoidal skin tags: Secondary | ICD-10-CM | POA: Diagnosis not present

## 2021-07-28 DIAGNOSIS — R6881 Early satiety: Secondary | ICD-10-CM | POA: Diagnosis not present

## 2021-07-28 DIAGNOSIS — K2950 Unspecified chronic gastritis without bleeding: Secondary | ICD-10-CM | POA: Diagnosis not present

## 2021-07-28 DIAGNOSIS — K2970 Gastritis, unspecified, without bleeding: Secondary | ICD-10-CM

## 2021-07-28 DIAGNOSIS — R634 Abnormal weight loss: Secondary | ICD-10-CM | POA: Diagnosis not present

## 2021-07-28 MED ORDER — LINACLOTIDE 145 MCG PO CAPS: 145.0000 ug | ORAL_CAPSULE | Freq: Every day | ORAL | Status: DC

## 2021-07-28 MED ORDER — LINACLOTIDE 145 MCG PO CAPS: 145.0000 ug | ORAL_CAPSULE | Freq: Every day | ORAL | 1 refills | Status: DC

## 2021-07-28 MED ORDER — FLEET ENEMA 7-19 GM/118ML RE ENEM
1.0000 | ENEMA | Freq: Once | RECTAL | Status: AC
  Administered 2021-07-28: 1 via RECTAL

## 2021-07-28 MED ORDER — SODIUM CHLORIDE 0.9 % IV SOLN: 500.0000 mL | Freq: Once | INTRAVENOUS | Status: DC

## 2021-07-28 NOTE — Op Note (Signed)
[facility] ?Patient Name: Shelby AquasKathleen Toops ?Procedure Date: 07/28/2021 1:12 PM ?MRN: [id number] ?Endoscopist: Doristine LocksVito Adreana Coull , MD ?Age: [age] ?Referring MD:  ?Date of Birth: 01-19-1996 ?Gender: Female ?Account #: [id number] ?Procedure:                Colonoscopy ?Indications:              Change in bowel habits, Weight loss ?Medicines:                Monitored Anesthesia Care ?Procedure:                Pre-Anesthesia Assessment: ?                          - Prior to the procedure, a History and Physical  ?                          was performed, and patient medications and  ?                          allergies were reviewed. The patient's tolerance of  ?                          previous anesthesia was also reviewed. The risks  ?                          and benefits of the procedure and the sedation  ?                          options and risks were discussed with the patient.  ?                          All questions were answered, and informed consent  ?                          was obtained. Prior Anticoagulants: The patient has  ?                          taken no previous anticoagulant or antiplatelet  ?                          agents. ASA Grade Assessment: II - A patient with  ?                          mild systemic disease. After reviewing the risks  ?                          and benefits, the patient was deemed in  ?                          satisfactory condition to undergo the procedure. ?                          After obtaining informed consent, the colonoscope  ?  was passed under direct vision. Throughout the  ?                          procedure, the patient's blood pressure, pulse, and  ?                          oxygen saturations were monitored continuously. The  ?                          Olympus CF-HQ190L (Serial# [id number]) Colonoscope was  ?                          introduced through the anus with the intention of  ?                          advancing to the ileum.  The scope was advanced to  ?                          the descending colon before the procedure was  ?                          aborted due to poor prep and poor visualization.  ?                          Medications were given. The colonoscopy was  ?                          technically difficult and complex due to poor bowel  ?                          prep. The patient tolerated the procedure well. The  ?                          quality of the bowel preparation was poor. The  ?                          rectum was photographed. ?Scope In: 1:40:03 PM ?Scope Out: 1:45:34 PM ?Total Procedure Duration: 0 hours 5 minutes 31 seconds  ?Findings:                 Skin tags were found on perianal exam. ?                          A large amount of solid stool was found in the  ?                          rectum, in the sigmoid colon, and in the distal  ?                          descending colon, precluding visualization. Lavage  ?                          of the area was performed using copious amounts of  ?  tap water, resulting in incomplete clearance with  ?                          continued poor visualization. ?                          The limited mucosa that was visualized was  ?                          otherwise normal appearing. No areas of mucosal  ?                          erythema, edema, erosions, or ulcerations. Biopsies  ?                          were taken with a cold forceps for histology.  ?                          Estimated blood loss was minimal. ?                          The retroflexed view of the distal rectum and anal  ?                          verge was normal and showed no anal or rectal  ?                          abnormalities. ?Complications:            No immediate complications. ?Estimated Blood Loss:     Estimated blood loss: none. ?Impression:               - Preparation of the colon was poor and precluded  ?                          any further advancement of  the colonoscope. ?                          - Perianal skin tags found on perianal exam. ?                          - Stool in the rectum, in the sigmoid colon and in  ?                          the distal descending colon. ?                          - The limited mucosa that was visualized was  ?                          otherwise normal appearing. Biopsied. ?                          - The distal rectum and anal verge are normal on  ?  retroflexion view. ?Recommendation:           - Patient has a contact number available for  ?                          emergencies. The signs and symptoms of potential  ?                          delayed complications were discussed with the  ?                          patient. Return to normal activities tomorrow.  ?                          Written discharge instructions were provided to the  ?                          patient. ?                          - Resume previous diet. ?                          - Continue present medications. ?                          - Await pathology results. ?                          - Repeat colonoscopy because the bowel preparation  ?                          was poor. Plan for clears x2 days, Dulcolax 10 mg  ?                          PO BID x2 days, full Miralax prep 2 nights prior,  ?                          followed by full Sutab prep the night prior to  ?                          colonoscopy. ?                          - Use Linzess (linaclotide) 145 mcg PO daily. If  ?                          subefficacious, will increase to 290 mcg daily. ?                          - Return to GI clinic at appointment to be  ?                          scheduled. If pathology from colonoscopy and upper  ?  endoscopy unrevealing, and symptoms ongoing, plan  ?                          for Gastric Emptying Study and CT abdomen/pelvis  ?                          along with Sitz Marker study. ?Doristine Locks,  MD ?07/28/2021 1:58:46 PM ?

## 2021-07-28 NOTE — Progress Notes (Signed)
Patient called at 11:00 am saying that she still isn't cleaned out and was having dark diarrhea.  ? ?After speaking with Dr. Bryan Lemma he advised to have patient do an enema.  Patient completed the enema and stool is still dark diarrhea, unable to see bottom of toilet.  ? ?Patient was provided options to go ahead with both procedures or she can reschedule and try 2 day prep with sutabs.  Patient decided she wanted to go ahead with her scheduled procedures today.  ? ?

## 2021-07-28 NOTE — Progress Notes (Signed)
Called to room to assist during endoscopic procedure.  Patient ID and intended procedure confirmed with present staff. Received instructions for my participation in the procedure from the performing physician.  

## 2021-07-28 NOTE — Progress Notes (Signed)
? ?GASTROENTEROLOGY PROCEDURE H&P NOTE  ? ?Primary Care Physician: ?Legrand Como, NP ? ? ? ?Reason for Procedure:  Decreased appetite, early satiety, weight loss, change in bowel habits, nausea, regurgitation, vitamin D deficiency ? ?Plan:    EGD, colonoscopy ? ?Patient is appropriate for endoscopic procedure(s) in the ambulatory ([place]) setting. ? ?The nature of the procedure, as well as the risks, benefits, and alternatives were carefully and thoroughly reviewed with the patient. Ample time for discussion and questions allowed. The patient understood, was satisfied, and agreed to proceed.  ? ? ? ?HPI: ?Shelby Dyer is a 26 y.o. female who presents for EGD and colonoscopy for evaluation of multiple GI symptoms to include decreased appetite, early satiety, nausea, regurgitation, weight loss, change in bowel habits, along with vitamin D deficiency.  Patient was most recently seen in the Gastroenterology Clinic on 07/17/2021 by me.  No interval change in medical history since that appointment. Please refer to that note for full details regarding GI history and clinical presentation.  ? ?Past Medical History:  ?Diagnosis Date  ? Anxiety   ? ? ?Past Surgical History:  ?Procedure Laterality Date  ? Tub place in both ear  Bilateral   ? When she was 26 years of age  ? WISDOM TOOTH EXTRACTION  2006  ? All 4  ? ? ?Prior to Admission medications   ?Medication Sig Start Date End Date Taking? Authorizing Provider  ?ALPRAZolam (XANAX XR) 0.5 MG 24 hr tablet Take 0.5 mg by mouth as needed for anxiety.    [provider]  ?cetirizine (ZYRTEC) 10 MG tablet Take 20 mg by mouth daily.    [provider]  ?EPINEPHrine 0.3 mg/0.3 mL IJ SOAJ injection Inject 0.3 mg into the muscle as needed for anaphylaxis.    [provider]  ?famotidine (PEPCID) 40 MG tablet Take 40 mg by mouth 2 (two) times daily.    [provider]  ?fexofenadine (ALLEGRA) 180 MG tablet Take 180 mg by mouth as needed for  allergies or rhinitis.    [provider]  ?hydrOXYzine (ATARAX) 25 MG tablet Take 25-50 mg by mouth every 8 (eight) hours as needed for anxiety.    [provider]  ?hyoscyamine (LEVSIN) 0.125 MG/ML solution Take 0.125 mg by mouth every 4 (four) hours as needed for bladder spasms.    [provider]  ?levonorgestrel (MIRENA) 20 MCG/24HR IUD 1 each by Intrauterine route once.    [provider]  ?metoprolol tartrate (LOPRESSOR) 25 MG tablet Take 25 mg by mouth 2 (two) times daily.    [provider]  ?Multiple Vitamin (MULTIVITAMIN) tablet Take 1 tablet by mouth daily.    [provider]  ?nortriptyline (PAMELOR) 25 MG capsule Take 25-50 mg by mouth daily after supper.    [provider]  ?ondansetron (ZOFRAN) 4 MG tablet Take 1 tablet (4 mg total) by mouth every 6 (six) hours as needed for nausea or vomiting. 07/17/21   Darrell Hauk, Dominic Pea, DO  ?Sod Picosulfate-Mag Ox-Cit Acd (CLENPIQ) 10-3.5-12 MG-GM -GM/160ML SOLN Take 1 kit by mouth as directed. 07/17/21   Dewitt Judice, Dominic Pea, DO  ? ? ?Current Outpatient Medications  ?Medication Sig Dispense Refill  ? ALPRAZolam (XANAX XR) 0.5 MG 24 hr tablet Take 0.5 mg by mouth as needed for anxiety.    ? cetirizine (ZYRTEC) 10 MG tablet Take 20 mg by mouth daily.    ? EPINEPHrine 0.3 mg/0.3 mL IJ SOAJ injection Inject 0.3 mg into the  muscle as needed for anaphylaxis.    ? famotidine (PEPCID) 40 MG tablet Take 40 mg by mouth 2 (two) times daily.    ? fexofenadine (ALLEGRA) 180 MG tablet Take 180 mg by mouth as needed for allergies or rhinitis.    ? hydrOXYzine (ATARAX) 25 MG tablet Take 25-50 mg by mouth every 8 (eight) hours as needed for anxiety.    ? hyoscyamine (LEVSIN) 0.125 MG/ML solution Take 0.125 mg by mouth every 4 (four) hours as needed for bladder spasms.    ? levonorgestrel (MIRENA) 20 MCG/24HR IUD 1 each by Intrauterine route once.    ? metoprolol tartrate (LOPRESSOR) 25 MG tablet Take 25 mg by mouth 2  (two) times daily.    ? Multiple Vitamin (MULTIVITAMIN) tablet Take 1 tablet by mouth daily.    ? nortriptyline (PAMELOR) 25 MG capsule Take 25-50 mg by mouth daily after supper.    ? ondansetron (ZOFRAN) 4 MG tablet Take 1 tablet (4 mg total) by mouth every 6 (six) hours as needed for nausea or vomiting. 12 tablet 0  ? Sod Picosulfate-Mag Ox-Cit Acd (CLENPIQ) 10-3.5-12 MG-GM -GM/160ML SOLN Take 1 kit by mouth as directed. 320 mL 0  ? ?Current Facility-Administered Medications  ?Medication Dose Route Frequency Provider Last Rate Last Admin  ? 0.9 %  sodium chloride infusion  500 mL Intravenous Once Darrik Richman V, DO      ? ? ?Allergies as of 07/28/2021 - Review Complete 07/28/2021  ?Allergen Reaction Noted  ? Latex Anaphylaxis and Hives 06/16/2019  ? ? ?Family History  ?Problem Relation Age of Onset  ? Atrial fibrillation Father   ? Other Sister   ? Hypertension Brother   ? Breast cancer Paternal Grandmother   ? Colon cancer Neg Hx   ? Stomach cancer Neg Hx   ? Liver cancer Neg Hx   ? Pancreatic cancer Neg Hx   ? ? ?Social History  ? ?Socioeconomic History  ? Marital status: Married  ?  Spouse name: Rolla Plate  ? Number of children: Not on file  ? Years of education: Not on file  ? Highest education level: Not on file  ?Occupational History  ? Occupation: Camera operator  ?  Comment: [employer]  ?Tobacco Use  ? Smoking status: Never  ? Smokeless tobacco: Not on file  ?Vaping Use  ? Vaping Use: Never used  ?Substance and Sexual Activity  ? Alcohol use: No  ? Drug use: Never  ? Sexual activity: Not on file  ?  Comment: mirena  ?Other Topics Concern  ? Not on file  ?Social History Narrative  ? Not on file  ? ?Social Determinants of Health  ? ?Financial Resource Strain: Not on file  ?Food Insecurity: Not on file  ?Transportation Needs: Not on file  ?Physical Activity: Not on file  ?Stress: Not on file  ?Social Connections: Not on file  ?Intimate Partner Violence: Not on file  ? ? ?Physical Exam: ?Vital  signs in last 24 hours: ?@BP  122/71   Pulse (!) 118   Temp (!) 97.5 ?F (36.4 ?C)   Ht 5' 9"  (1.753 m)   Wt 247 lb (112 kg)   SpO2 100%   BMI 36.48 kg/m?  ?GEN: NAD ?EYE: Sclerae anicteric ?ENT: MMM ?CV: Non-tachycardic ?Pulm: CTA b/l ?GI: Soft, NT/ND ?NEURO:  Alert & Oriented x 3 ? ? ?Gerrit Heck, DO ?[facility] ? ? ?07/28/2021 1:12 PM ? ?

## 2021-07-28 NOTE — Progress Notes (Signed)
1330 Robinul 0.1 mg IV given due large amount of secretions upon assessment.  MD made aware, vss  °

## 2021-07-28 NOTE — Patient Instructions (Signed)
Await for pathology results  ? ?YOU HAD AN ENDOSCOPIC PROCEDURE TODAY AT THE [facility]:   Refer to the procedure report that was given to you for any specific questions about what was found during the examination.  If the procedure report does not answer your questions, please call your gastroenterologist to clarify.  If you requested that your care partner not be given the details of your procedure findings, then the procedure report has been included in a sealed envelope for you to review at your convenience later. ? ?YOU SHOULD EXPECT: Some feelings of bloating in the abdomen. Passage of more gas than usual.  Walking can help get rid of the air that was put into your GI tract during the procedure and reduce the bloating. If you had a lower endoscopy (such as a colonoscopy or flexible sigmoidoscopy) you may notice spotting of blood in your stool or on the toilet paper. If you underwent a bowel prep for your procedure, you may not have a normal bowel movement for a few days. ? ?Please Note:  You might notice some irritation and congestion in your nose or some drainage.  This is from the oxygen used during your procedure.  There is no need for concern and it should clear up in a day or so. ? ?SYMPTOMS TO REPORT IMMEDIATELY: ? ?Following lower endoscopy (colonoscopy or flexible sigmoidoscopy): ? Excessive amounts of blood in the stool ? Significant tenderness or worsening of abdominal pains ? Swelling of the abdomen that is new, acute ? Fever of 100?F or higher ? ?Following upper endoscopy (EGD) ? Vomiting of blood or coffee ground material ? New chest pain or pain under the shoulder blades ? Painful or persistently difficult swallowing ? New shortness of breath ? Fever of 100?F or higher ? Black, tarry-looking stools ? ?For urgent or emergent issues, a gastroenterologist can be reached at any hour by calling [phone]. ?Do not use MyChart messaging for urgent concerns.  ? ? ?DIET:  We do  recommend a small meal at first, but then you may proceed to your regular diet.  Drink plenty of fluids but you should avoid alcoholic beverages for 24 hours. ? ?ACTIVITY:  You should plan to take it easy for the rest of today and you should NOT DRIVE or use heavy machinery until tomorrow (because of the sedation medicines used during the test).   ? ?FOLLOW UP: ?Our staff will call the number listed on your records 48-72 hours following your procedure to check on you and address any questions or concerns that you may have regarding the information given to you following your procedure. If we do not reach you, we will leave a message.  We will attempt to reach you two times.  During this call, we will ask if you have developed any symptoms of COVID 19. If you develop any symptoms (ie: fever, flu-like symptoms, shortness of breath, cough etc.) before then, please call [phone].  If you test positive for Covid 19 in the 2 weeks post procedure, please call and report this information to [place].   ? ?If any biopsies were taken you will be contacted by phone or by letter within the next 1-3 weeks.  Please call us at [phone] if you have not heard about the biopsies in 3 weeks.  ? ? ?SIGNATURES/CONFIDENTIALITY: ?You and/or your care partner have signed paperwork which will be entered into your electronic medical record.  These signatures attest to the fact that that  the information above on your After Visit Summary has been reviewed and is understood.  Full responsibility of the confidentiality of this discharge information lies with you and/or your care-partner. ? ?

## 2021-07-28 NOTE — Progress Notes (Signed)
1343 Sedation stopped due to poor prep, vss ?

## 2021-07-28 NOTE — Op Note (Signed)
[facility] ?Patient Name: Shelby Dyer ?Procedure Date: 07/28/2021 1:13 PM ?MRN: [id number] ?Endoscopist: Gerrit Heck , MD ?Age: 26 ?Referring MD:  ?Date of Birth: Oct 26, 1995 ?Gender: Female ?Account #: [id number] ?Procedure:                Upper GI endoscopy ?Indications:              Early satiety, Nausea, Weight loss, Change in bowel  ?                          habits, Vitamin D deficiency ?Medicines:                Monitored Anesthesia Care ?Procedure:                Pre-Anesthesia Assessment: ?                          - Prior to the procedure, a History and Physical  ?                          was performed, and patient medications and  ?                          allergies were reviewed. The patient's tolerance of  ?                          previous anesthesia was also reviewed. The risks  ?                          and benefits of the procedure and the sedation  ?                          options and risks were discussed with the patient.  ?                          All questions were answered, and informed consent  ?                          was obtained. Prior Anticoagulants: The patient has  ?                          taken no previous anticoagulant or antiplatelet  ?                          agents. ASA Grade Assessment: II - A patient with  ?                          mild systemic disease. After reviewing the risks  ?                          and benefits, the patient was deemed in  ?                          satisfactory condition to undergo the procedure. ?  After obtaining informed consent, the endoscope was  ?                          passed under direct vision. Throughout the  ?                          procedure, the patient's blood pressure, pulse, and  ?                          oxygen saturations were monitored continuously. The  ?                          Endoscope was introduced through the mouth, and  ?                          advanced to the second  part of duodenum. The upper  ?                          GI endoscopy was accomplished without difficulty.  ?                          The patient tolerated the procedure well. ?Scope In: ?Scope Out: ?Findings:                 The examined esophagus was normal. ?                          The Z-line was regular and was found 35 cm from the  ?                          incisors. ?                          The gastroesophageal flap valve was visualized  ?                          endoscopically and classified as Hill Grade III  ?                          (minimal fold, loose to endoscope, hiatal hernia  ?                          likely). ?                          The entire examined stomach was normal. The pylorus  ?                          was patent and easily traversed. Biopsies were  ?                          taken with a cold forceps for Helicobacter pylori  ?                          testing. Estimated blood loss was minimal. ?  The examined duodenum was normal. Biopsies were  ?                          taken with a cold forceps for histology. Estimated  ?                          blood loss was minimal. ?Complications:            No immediate complications. ?Estimated Blood Loss:     Estimated blood loss was minimal. ?Impression:               - Normal esophagus. ?                          - Z-line regular, 35 cm from the incisors. ?                          - Gastroesophageal flap valve classified as Hill  ?                          Grade III (minimal fold, loose to endoscope, hiatal  ?                          hernia likely). ?                          - Normal stomach. Biopsied. ?                          - Normal examined duodenum. Biopsied. ?Recommendation:           - Patient has a contact number available for  ?                          emergencies. The signs and symptoms of potential  ?                          delayed complications were discussed with the  ?                           patient. Return to normal activities tomorrow.  ?                          Written discharge instructions were provided to the  ?                          patient. ?                          - Resume previous diet. ?                          - Continue present medications. ?                          - Await pathology results. ?                          -  Colonoscopy today. ?Gerrit Heck, MD ?07/28/2021 1:49:54 PM ?

## 2021-07-28 NOTE — Progress Notes (Signed)
Report given to PACU, vss 

## 2021-07-31 ENCOUNTER — Telehealth: Payer: Self-pay

## 2021-07-31 NOTE — Telephone Encounter (Signed)
Patient is returning your call.  

## 2021-07-31 NOTE — Therapy (Signed)
?OUTPATIENT PHYSICAL THERAPY TREATMENT NOTE ? ? ?Patient Name: Shelby Dyer ?MRN: [id number] ?DOB:12-19-1995, 26 y.o., female ?Today's Date: 08/01/2021 ? ?PCP: Vernell Morgans, NP ?REFERRING PROVIDER: Myra Rude, MD ? ? PT End of Session - 08/01/21 1203   ? ? Visit Number 6   ? Number of Visits 16   ? Date for PT Re-Evaluation 08/07/21   ? Authorization Type BCBS   ? PT Start Time 1200   ? PT Stop Time 1230   ? PT Time Calculation (min) 30 min   ? Activity Tolerance Patient tolerated treatment well   ? Behavior During Therapy [facility] for tasks assessed/performed   ? ?  ?  ? ?  ? ? ? ? ?Past Medical History:  ?Diagnosis Date  ? Anxiety   ? ?Past Surgical History:  ?Procedure Laterality Date  ? Tub place in both ear  Bilateral   ? When she was 26 years of age  ? WISDOM TOOTH EXTRACTION  2006  ? All 4  ? ?Patient Active Problem List  ? Diagnosis Date Noted  ? Eating disorder 06/13/2021  ? Hip pain 06/13/2021  ? 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 Dysautonomia orthostatic hypotension syndrome 05/17/2021  ? 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 IBS (irritable bowel syndrome) 04/03/2021  ? Dog bite, hand, right, initial encounter 06/25/2019  ? 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 ? ?PCP: Eulogio Bear, MD ?  ?REFERRING PROVIDER: Myra Rude, MD ? ?THERAPY DIAG:  ?Hypermobility syndrome ? ?Other abnormalities of gait and mobility ? ?Polyarthralgia ? ?PERTINENT HISTORY: Migraines, hypermobility  ?MCAS, tachycardia, IBS ?Her mother has EDS and her sister has POTS ? ?PRECAUTIONS: Other: POTS , positional changes.  Give seated rest breaks for standing, blood pools in LE  ? ?SUBJECTIVE: Rt elbow and Rt shoulder and neck always hurts.  Was sore after last time.   ?PAIN:  ?Are you having pain? Yes:  ?NPRS scale: 4/10 rated today.  ?Pain location: generalized, neck  today ?Pain description: achy ?Aggravating factors: activity. Back pain (standing long time) and carrying item in R hand ?Relieving factors: meds, heating pad, rest  ? ? ?OBJECTIVE:  ?  ?DIAGNOSTIC FINDINGS:  ?CT last yet ?  ?PATIENT SURVEYS:  ?Bristol Impact Scale  given on eval  ?  ?COGNITION: ?           Overall cognitive status: Within functional limits for tasks assessed              ?            ?SENSATION: ?Reports hands, feet in the past but improved with med change  ?Amrs and hands sleeping AM frequently ?  ?MUSCLE LENGTH: ?Hamstrings: tight ?  ?POSTURE:  ?Forward head, rounded shoulders ?  ?PALPATION: ?NT ?  ?LUMBAR ROM:  ?  ?Active  A/PROM  ?06/12/2021  ?Flexion Palms to floor  ?Extension Hyper  ?Right lateral flexion    ?Left lateral flexion    ?Right rotation Hyper  ?Left rotation Hyper   ? (Blank rows = not tested) ?  ? ?CERVICAL ROM: 07/20/21 ?Extension 75 deg ?Flexion 40 capital flexion , 60 cervical flexion  ?Rt sidebending 55 ?Lt sidebending 49 ?Rt rot 75  ?Lt. rot 82 ?Pain minimal with AROM of C-spine, senses clicking with extension  ?LE ROM: ?  ?Active  Right ?06/12/2021 Left ?06/12/2021  ?Hip flexion      ?  Hip extension      ?Hip abduction      ?Hip adduction      ?Hip internal rotation Rel tight Rel tight   ?Hip external rotation WNL WNL  ?Knee flexion      ?Knee extension      ?Ankle dorsiflexion      ?Ankle plantarflexion      ?Ankle inversion      ?Ankle eversion      ? (Blank rows = not tested) ?  ?LE MMT: ?  ?MMT Right ?06/12/2021 Left ?06/12/2021  ?Hip flexion 4+ 4+  ?Hip extension 4- 4-  ?Hip abduction 4 4  ?Hip adduction      ?Hip internal rotation      ?Hip external rotation      ?Knee flexion 4+ 4  ?Knee extension 4+ 4+  ?Ankle dorsiflexion 4+ 4+  ?Ankle plantarflexion      ?Ankle inversion      ?Ankle eversion      ? (Blank rows = not tested) ?  ?FUNCTIONAL TESTS:  ?2 minute walk test: 412 feet , HR 120 max Sa02 99% ?14.3 5 x STS ( 07/07/21) ?Tandem stance 6 sec RLE back (07/07/21) ?Tandem  stance 8 sec LLE back  (07/07/21)  ?SLS 4-5 sec best bilat  ?DGI (21/24) -taken 07/07/21 ?  ?GAIT: ?Distance walked: 150 ?Assistive device utilized: None ?Level of assistance: Complete Independence ?Comments: none  ?  ?Today's Treatment ? ? ?[facility] Adult PT Treatment:                                                DATE: 08/01/21 ?Therapeutic Exercise: ?Rt LE quad set x 10 to SLR x 10  ?Ball bridging x 10  ?Ball hamstring curl x 10  ?Bent knee partial bridge combo x 10 ?Partial bridge with ball and blue band horizontal pull x 10  ?Sidelying clam and hip abduction blue band  ?Standing spring board shoulder extension x 15  ?Sidestepping iso ER at 0 deg 1 min ?ER and IR blue band x 15  ?Standing fly bilateral x 15 , then unilateral x 5  ? ? ? ?[facility]                                                DATE: 07/20/21 ?Therapeutic Exercise: Scapular, cervical, lumbopelvic stability  ?Cervical retraction x 10 used blue soft ball  ?Added slight rotation x 10 each side  ?Supine Tr A with march  then SLR x 10 each side  ?Arm lift with SLR for neutral core x 10  ?Narrow grip looped band overhead lift x 15  ?ER green looped band small ROM x 10  ?Ball squeeze x 10  ?Ball squeeze with bridging x 10 ?Feet on ball for destabilization , bent knee fall out x 10  ?Self Care: ?AROM of cervical spine and comparison to norms ?  ?[facility]                                                DATE: 07/11/21 ?Therapeutic  Exercise: ?Pilates Tower for LE/Core strength, postural strength, lumbopelvic disassociation and core control.  Exercises included: ?Isometric Tr A and breathing  x 10 ?Bridge with ball squeeze x 10  ?Supine Arm Springs x 10 with ball squeeze ?Supine arms with alternating knee lift x 10 ?Bridge with Arm springs  x 10  ?Supine leg springs yellow single leg arcs and hip circles  ?Double leg arcs in parallel and turnout ?Double hip circles, small ROM x 5 each direction  ?Self Care: ?Knee proprioception with  transition to walking from sitting   ? ?[facility] Adult PT Treatment:                                                DATE: 07/07/21 ?Therapeutic Exercise: ?Review of HEP ?Hooklying clam with green band- bilat and single  ?Banded bridge with green  ?Functional tasks/balance ?14.3 5 x STS ( 07/07/21) ?Tandem stance 6 sec RLE back (07/07/21) ?Tandem stance 8 sec LLE back  (07/07/21)  ?SLS 4-5 sec best bilat (07/07/21)  ?DGI (21/24) -taken 07/07/21 ? ?[facility] Adult PT Treatment:                                                DATE: 06/21/21 ?Therapeutic Exercise: ?Established HEP: articulating bridge, supine march, bent knee fall outs ? ?  ?Initial  TREATMENT  ?Eval, intro, POC  ?  ?  ?PATIENT EDUCATION:  ?Education details: HEP ?Person educated: Patient ?Education method: Explanation ?Education comprehension: verbalized understanding ?  ?  ?HOME EXERCISE PROGRAM: ?Access Code: [email or web address] ?URL: [email or web address] ?Date: 07/07/2021 ?Prepared by: Jannette Spanner ? ?Exercises ?- Supine Bridge with Spinal Articulation  - 1 x daily - 7 x weekly - 2 sets - 10 reps ?- Bent Knee Fallouts with Alternating Legs  - 1 x daily - 7 x weekly - 2 sets - 10 reps ?- Supine March  - 1 x daily - 7 x weekly - 2 sets - 10 reps ?- Standing Tandem Balance with Counter Support  - 1 x daily - 7 x weekly - 1 sets - 3 reps - 30 hold ?- Standing Single Leg Stance with Counter Support  - 1 x daily - 7 x weekly - 1 sets - 3 reps - 30 hold ?- shoulder added horizontal abd and ER green or blue  ?ASSESSMENT: ?  ?CLINICAL IMPRESSION: ?Pt continues to tolerate stability exercises today without increased pain . Shorter session due to late arrival.  ?  ?OBJECTIVE IMPAIRMENTS cardiopulmonary status limiting activity, decreased activity tolerance, decreased balance, decreased coordination, decreased endurance, decreased mobility, difficulty walking, decreased ROM, decreased strength, dizziness, obesity, and pain.  ?  ?ACTIVITY LIMITATIONS cleaning, community  activity, meal prep, occupation, laundry, yard work, and shopping.  ?  ?PERSONAL FACTORS Past/current experiences, Time since onset of injury/illness/exacerbation, and 3+ comorbidities: chronic joint pain, hype

## 2021-07-31 NOTE — Telephone Encounter (Signed)
Attempted to call pt to schedule f/u as requested on 5/5 colonoscopy report, left voicemail for pt to call back.  Follow up appt scheduled with Dr. Barron Alvine on 08/23/21 at 9:40 am. Letter sent to [facility] and mailed to pt.  ?

## 2021-08-01 ENCOUNTER — Telehealth: Payer: Self-pay

## 2021-08-01 ENCOUNTER — Encounter: Payer: Self-pay | Admitting: Physical Therapy

## 2021-08-01 ENCOUNTER — Ambulatory Visit: Payer: 59 | Attending: Family Medicine | Admitting: Physical Therapy

## 2021-08-01 DIAGNOSIS — M357 Hypermobility syndrome: Secondary | ICD-10-CM | POA: Insufficient documentation

## 2021-08-01 DIAGNOSIS — M2550 Pain in unspecified joint: Secondary | ICD-10-CM | POA: Diagnosis present

## 2021-08-01 DIAGNOSIS — R2689 Other abnormalities of gait and mobility: Secondary | ICD-10-CM | POA: Diagnosis present

## 2021-08-01 NOTE — Telephone Encounter (Signed)
Mason Jim, RN ?to Me   ?Pt asked what the call was about yesterday so I let her know her appt time and date you had made for her, she said that was fine and she does not require a phone call back.  ?

## 2021-08-01 NOTE — Telephone Encounter (Signed)
?  Follow up Call- ? ? ?  07/28/2021  ?  1:10 PM  ?Call back number  ?Post procedure Call Back phone  # [phone]  ?Permission to leave phone message Yes  ?  ? ?Patient questions: ? ?Do you have a fever, pain , or abdominal swelling? No. ?Pain Score  0 * ? ?Have you tolerated food without any problems? Yes.   ? ?Have you been able to return to your normal activities? Yes.   ? ?Do you have any questions about your discharge instructions: ?Diet   No. ?Medications  No. ?Follow up visit  No. ? ?Do you have questions or concerns about your Care? Yes.   ? ?Pt asked what the call was about yesterday so I let her know her appt time and date Vernona Rieger RN had made for her, she said that was fine and she does not require a phone call back ? ?Actions: ?* If pain score is 4 or above: ?No action needed, pain <4. ? ? ?

## 2021-08-07 ENCOUNTER — Encounter: Payer: Self-pay | Admitting: Gastroenterology

## 2021-08-07 ENCOUNTER — Other Ambulatory Visit: Payer: Self-pay

## 2021-08-07 DIAGNOSIS — R6881 Early satiety: Secondary | ICD-10-CM

## 2021-08-07 DIAGNOSIS — R194 Change in bowel habit: Secondary | ICD-10-CM

## 2021-08-07 DIAGNOSIS — R110 Nausea: Secondary | ICD-10-CM

## 2021-08-07 DIAGNOSIS — R634 Abnormal weight loss: Secondary | ICD-10-CM

## 2021-08-07 DIAGNOSIS — R630 Anorexia: Secondary | ICD-10-CM

## 2021-08-07 DIAGNOSIS — K5900 Constipation, unspecified: Secondary | ICD-10-CM

## 2021-08-10 ENCOUNTER — Encounter: Payer: Self-pay | Admitting: Physical Therapy

## 2021-08-10 ENCOUNTER — Ambulatory Visit: Payer: 59 | Admitting: Physical Therapy

## 2021-08-10 DIAGNOSIS — M357 Hypermobility syndrome: Secondary | ICD-10-CM

## 2021-08-10 DIAGNOSIS — R2689 Other abnormalities of gait and mobility: Secondary | ICD-10-CM

## 2021-08-10 DIAGNOSIS — M2550 Pain in unspecified joint: Secondary | ICD-10-CM

## 2021-08-10 NOTE — Therapy (Signed)
OUTPATIENT PHYSICAL THERAPY TREATMENT NOTE RENEWAL    Patient Name: Shelby Dyer MRN: [id number] DOB:05-22-1995, 26 y.o., female Today's Date: 08/10/2021  PCP: Legrand Como, NP REFERRING PROVIDER: Rosemarie Ax, MD   PT End of Session - 08/10/21 1223     Visit Number 7    Number of Visits 16    Date for PT Re-Evaluation 08/07/21    Authorization Type BCBS    PT Start Time 1151    PT Stop Time 4193    PT Time Calculation (min) 44 min    Activity Tolerance Patient tolerated treatment well    Behavior During Therapy WFL for tasks assessed/performed                Past Medical History:  Diagnosis Date   Anxiety    Past Surgical History:  Procedure Laterality Date   Tub place in both ear  Bilateral    When she was 26 years of age   38 TOOTH EXTRACTION  2006   All 4   Patient Active Problem List   Diagnosis Date Noted   Eating disorder 06/13/2021   Hip pain 06/13/2021   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   Dysautonomia orthostatic hypotension syndrome 05/17/2021   Mast cell activation ([place]) 05/17/2021   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  Give seated rest breaks for standing, blood pools in LE   SUBJECTIVE:   I feel like my posture is better and I can correct it easier.  I have had less very difficult days since coming to PT.  She has more reserve after working.  It has  helped my back pain a little bit.  Hurting today in mid thoracic spine and near base of skull.  Rt post hip painful. Both knees hurt a little    PAIN:  Are you having pain? Yes:  NPRS scale: 3/10 rated today.  Pain location: back and Rt hip/back is 4/10.   Pain description: achy Aggravating factors: activity. Back pain (standing long time) and carrying item in R hand Relieving factors: meds, heating pad, rest    OBJECTIVE:    DIAGNOSTIC FINDINGS:  CT last yet   PATIENT SURVEYS:  Bristol Impact Scale  given on eval    COGNITION:            Overall cognitive status: Within functional limits for tasks assessed                          SENSATION: Reports hands, feet in the past but improved with med change  Amrs and hands sleeping AM frequently   MUSCLE LENGTH: Hamstrings: tight   POSTURE:  Forward head, rounded shoulders   PALPATION: NT   LUMBAR ROM:    Active  A/PROM  06/12/2021  Flexion Palms to floor  Extension Hyper  Right lateral flexion    Left lateral flexion    Right rotation Hyper  Left rotation Hyper    (  Blank rows = not tested)    CERVICAL ROM: 07/20/21 Extension 75 deg Flexion 40 capital flexion , 60 cervical flexion  Rt sidebending 55 Lt sidebending 49 Rt rot 75  Lt. rot 82 Pain minimal with AROM of C-spine, senses clicking with extension  LE ROM:   Active  Right 06/12/2021 Left 06/12/2021  Hip flexion      Hip extension      Hip abduction      Hip adduction      Hip internal rotation Rel tight Rel tight   Hip external rotation WNL WNL  Knee flexion      Knee extension      Ankle dorsiflexion      Ankle plantarflexion      Ankle inversion      Ankle eversion       (Blank rows = not tested)   LE MMT:   MMT Right 06/12/2021 Left 06/12/2021 Rt  08/10/21 Lt.  08/10/21  Hip flexion 4+ 4+ 5 5  Hip extension 4- 4-    Hip abduction 4 4    Hip adduction        Hip internal rotation        Hip external rotation        Knee flexion 4+ 4  5 5   Knee extension 4+ 4+ 5 5  Ankle dorsiflexion 4+ 4+    Ankle plantarflexion        Ankle inversion        Ankle eversion         (Blank rows = not tested)   FUNCTIONAL TESTS:  2 minute walk test: 412 feet , HR 120 max Sa02 99% 14.3 5 x STS ( 07/07/21) Tandem stance 6 sec RLE back (07/07/21) Tandem stance 8 sec LLE back  (07/07/21)  SLS 4-5 sec best bilat  DGI (21/24) -taken 07/07/21      BERG BALANCE  Sitting to Standing: Numbers; 0-4: 4  4. Stands without using hands and stabilize independently  3. Stands independently using hands  2. Stands using hands after multiple trials  1. Min A to stand  0. Mod-Max A to stand Standing unsupported: Numbers; 0-4: 4  4. Stands safely for 2 minutes  3. Stands 2 minutes with supervision  2. Stands 30 seconds unsupported  1. Needs several tries to stand unsupported for 30 seconds  0. Unable to stand unsupported for 30 seconds Sitting unsupported: Numbers; 0-4: 4  4. Sits for 2 minutes independently  3. Sits for 2 minutes with supervision  2. Able to sit 30 seconds  1. Able to sit 10 seconds  0. Unable to sit for 10 seconds Standing to Sitting: Numbers; 0-4: 4 4. Sits safely with minimal use of hands 3. Controls descent with hands 2. Uses back of legs against chair to control descent 1. Sits independently, but uncontrolled descent 0. Needs assistance Transfers: Numbers; 0-4: 4  4. Transfers safely with minor use of hands  3. Transfers safely definite use of hands  2. Transfers with verbal cueing/supervision  1. Needs 1 person assist  0. Needs 2 person assist  Standing with eyes closed: Numbers; 0-4: 4  4. Stands safely for 10 seconds  3. Stands 10 seconds with supervision   2. Able to stand for 3 seconds  1. Unable to keep eyes closed for 3 seconds, but is safe  0. Needs assist to keep from falling Standing with feet together: Numbers; 0-4: 3 incr postural sway 4. Stands for 1  minute safely 3. Stands for 1 minute with  supervision 2. Unable to hold for 30 seconds  1. Needs help to attain position but can hold for 15 seconds  0. Needs help to attain position and unable to hold for 15 seconds Reaching forward with outstretched arm: Numbers; 0-4: 4  4. Reaches forward 10 inches  3. Reaches forward 5 inches  2. Reaches forward 2 inches  1. Reaches forward with supervision  0. Loses balance/requires assistace Retrieving object from the floor: Numbers; 0-4: 4 4. Able to pick up easily and safely 3. Able to pick up with supervision 2. Unable to pick up, but reaches within 1-2 inches independently 1. Unable to pick up and needs supervision 0. Unable/needs assistance to keep from falling  Turning to look behind: Numbers; 0-4: 4  4. Looks behind from both sides and weight shifts well  3. Looks behind one side only, other side less weight shift  2. Turns sideways only, maintains balance  1. Needs supervision when turning  0. Needs assistance  Turning 360 degrees: Numbers; 0-4: 4  4. Able to turn in </=4 seconds  3. Able to turn on one side in </= 4 seconds   2. Able to turn slowly, but safely  1. Needs supervision or verbal cueing  0. Needs assistance Place alternate foot on stool: Numbers; 0-4: 4 4. Completes 8 steps in 20 seconds 3. Completes 8 steps in >20 seconds 2. 4 steps without assistance/supervision 1. Completes >2 steps with minimal assist 0. Unable, needs assist to keep from falling Standing with one foot in front: Numbers; 0-4: 4  4. Independent tandem for 30 seconds  3. Independent foot ahead for 30 seconds  2. Independent small step for 30 seconds  1. Needs help to step, but can hold for 15 seconds  0. Loses balance while standing/stepping Standing on one foot: Numbers; 0-4: 4 4. Holds >10 seconds 3. Holds 5-10 seconds 2. Holds >/=3 seconds  1. Holds <3 seconds 0. Unable   Total Score: 55/56  GAIT: Distance walked: 150 Assistive device utilized: None Level of assistance:  Complete Independence Comments: none    Today's Treatment  [facility] Adult PT Treatment:                                                DATE: 08/10/21 Therapeutic Exercise: Sit to stand x 15 with 10 lbs  HR 140's  Heel raises 10 lbs x 15  Chest press 10 lbs x 10 with KB Sit to stand with chest press combo 10 lbs (Sa02 86 post ex, quickly went o up to 98%) Hip hinge dead lift 10 lbs x 15  See above for [employer] Test    [facility] Adult PT Treatment:                                                DATE: 08/01/21 Therapeutic Exercise: Rt LE quad set x 10 to SLR x 10  Ball bridging x 10  Ball hamstring curl x 10  Bent knee partial bridge combo x 10 Partial bridge with ball and blue band horizontal pull x 10  Sidelying clam and hip abduction blue band  Standing spring board shoulder extension x  15  Sidestepping iso ER at 0 deg 1 min ER and IR blue band x 15  Standing fly bilateral x 15 , then unilateral x 5     [facility] Adult PT Treatment:                                                DATE: 07/20/21 Therapeutic Exercise: Scapular, cervical, lumbopelvic stability  Cervical retraction x 10 used blue soft ball  Added slight rotation x 10 each side  Supine Tr A with march  then SLR x 10 each side  Arm lift with SLR for neutral core x 10  Narrow grip looped band overhead lift x 15  ER green looped band small ROM x 10  Ball squeeze x 10  Ball squeeze with bridging x 10 Feet on ball for destabilization , bent knee fall out x 10  Self Care: AROM of cervical spine and comparison to norms   [facility] Adult PT Treatment:                                                DATE: 07/11/21 Therapeutic Exercise: Pilates Tower for LE/Core strength, postural strength, lumbopelvic disassociation and core control.  Exercises included: Isometric Tr A and breathing  x 10 Bridge with ball squeeze x 10  Supine Arm Springs x 10 with ball squeeze Supine arms with alternating knee lift x 10 Bridge with Arm springs  x 10   Supine leg springs yellow single leg arcs and hip circles  Double leg arcs in parallel and turnout Double hip circles, small ROM x 5 each direction  Self Care: Knee proprioception with transition to walking from sitting    Initial  TREATMENT  Eval, intro, POC      PATIENT EDUCATION:  Education details: HEP Person educated: Patient Education method: Explanation Education comprehension: verbalized understanding     HOME EXERCISE PROGRAM: Access Code: [email or web address] URL: [email or web address] Date: 07/07/2021 Prepared by: Hessie Diener  Exercises - Supine Bridge with Spinal Articulation  - 1 x daily - 7 x weekly - 2 sets - 10 reps - Bent Knee Fallouts with Alternating Legs  - 1 x daily - 7 x weekly - 2 sets - 10 reps - Supine March  - 1 x daily - 7 x weekly - 2 sets - 10 reps - Standing Tandem Balance with Counter Support  - 1 x daily - 7 x weekly - 1 sets - 3 reps - 30 hold - Standing Single Leg Stance with Counter Support  - 1 x daily - 7 x weekly - 1 sets - 3 reps - 30 hold - shoulder added horizontal abd and ER green or blue  ASSESSMENT:   CLINICAL IMPRESSION: Pt continues to benefit from skilled PT to focus on stability to spine and postural strengthening.  She has made subjective improvements with daily activities and at work.  She cont to have difficulty with longer periods of standing and bending, squatting.  She would like to cont to progress and incorporate more functional strengthening into her POC.     OBJECTIVE IMPAIRMENTS cardiopulmonary status limiting activity, decreased activity tolerance, decreased balance, decreased coordination, decreased endurance, decreased mobility, difficulty walking, decreased ROM, decreased strength, dizziness, obesity,  and pain.    ACTIVITY LIMITATIONS cleaning, community activity, meal prep, occupation, laundry, yard work, and shopping.    PERSONAL FACTORS Past/current experiences, Time since onset of  injury/illness/exacerbation, and 3+ comorbidities: chronic joint pain, hypersomnelence, migraines, POTS, MCAS  are also affecting patient's functional outcome.      REHAB POTENTIAL: Excellent   CLINICAL DECISION MAKING: Stable/uncomplicated   EVALUATION COMPLEXITY: Low     GOALS: Goals reviewed with patient? No   SHORT TERM GOALS: Target date: 07/03/2021   Pt will be I with HEP for core, pelvic stability  Baseline: unknown Goal status: MET   2.  Pt will complete Survey for hypermobility [facility] Impact Scale) and set goal.  Baseline: given on eval  Goal status: ONGOING   3.  Pt will report increased tolerance for work, home activities, reporting less fatigue overall with 1-2 tasks  Baseline: low energy , hard to do > 1 simple home task  Goal status: ONGOING due to other GI issues    4  Pt will be screened for balance (DGI) and goal set 24/24.  Baseline: 21/24  Goal status: ongoing    LONG TERM GOALS: Target date: 08/07/2021   Pt will be able to walk 600 feet or more on the 2 min walk test to demo improved exercise tolerance  Baseline: 434 feet  Goal status: ongoing   2.  Pt will understand and demo proper posture and body mechanics as it pertains to lifting and squatting for work, home tasks  Baseline: needs reinforcement Goal status: ongoing    3.  Pt will be able to walk on uneven ground with improved confidence in balance  Baseline: loses balance  Goal status: ongoing , balance screens done    4.  Pt will demo 5/5 strength in UE and LE for maximal joint support  Baseline:4+/5 to 5/5 in LEs  Goal status: INITIAL   5.  Pt will be able to stand on tandem , SLS for light balance work for 30 sec at a time , stable surface Baseline: unable > 10 sec  Goal status: INITIAL         PLAN: PT FREQUENCY: 1x/week   PT DURATION: 8 weeks   PLANNED INTERVENTIONS: Therapeutic exercises, Therapeutic activity, Neuromuscular re-education, Balance training, Gait training,  Patient/Family education, Joint mobilization, Aquatic Therapy, Electrical stimulation, Cryotherapy, Moist heat, Taping, and Manual therapy.   PLAN FOR NEXT SESSION:  Review HEP, balance challenges, squat progression    Raeford Razor, PT 08/10/21 12:40 PM Phone: [phone] Fax: [phone]

## 2021-08-14 ENCOUNTER — Other Ambulatory Visit: Payer: Self-pay

## 2021-08-14 DIAGNOSIS — R194 Change in bowel habit: Secondary | ICD-10-CM

## 2021-08-14 DIAGNOSIS — R634 Abnormal weight loss: Secondary | ICD-10-CM

## 2021-08-14 MED ORDER — SUTAB 1479-225-188 MG PO TABS
1.0000 | ORAL_TABLET | Freq: Once | ORAL | 0 refills | Status: AC
Start: 2021-08-14 — End: 2021-08-14

## 2021-08-14 NOTE — Progress Notes (Signed)
Plan for clears x2 days, Dulcolax 10 mg PO BID x2 days, full Miralax prep 2 nights prior, followed by full Sutab prep the night prior to colonoscopy.

## 2021-08-14 NOTE — Addendum Note (Signed)
Addended by: Marice Potter on: 08/14/2021 03:49 PM   Modules accepted: Orders

## 2021-08-17 ENCOUNTER — Ambulatory Visit (INDEPENDENT_AMBULATORY_CARE_PROVIDER_SITE_OTHER): Payer: 59 | Admitting: Cardiology

## 2021-08-17 ENCOUNTER — Encounter: Payer: Self-pay | Admitting: Cardiology

## 2021-08-17 VITALS — BP 110/72 | HR 93 | Ht 69.0 in | Wt 236.0 lb

## 2021-08-17 DIAGNOSIS — D8940 Mast cell activation, unspecified: Secondary | ICD-10-CM | POA: Diagnosis not present

## 2021-08-17 DIAGNOSIS — R000 Tachycardia, unspecified: Secondary | ICD-10-CM

## 2021-08-17 DIAGNOSIS — I951 Orthostatic hypotension: Secondary | ICD-10-CM

## 2021-08-17 NOTE — Progress Notes (Signed)
Cardiology Office Note:    Date:  08/17/2021   ID:  Shelby Dyer, DOB April 07, 1995, MRN [id number]  PCP:  Vernell Morgans, NP  Cardiologist:  Gypsy Balsam, MD    Referring MD: Vernell Morgans, NP   Chief Complaint  Patient presents with   Follow-up  Doing fine, responding nicely to metoprolol and  History of Present Illness:    Shelby Dyer is a 26 y.o. 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  Overall she is doing better with beta-blocker.  She said sometimes she get dizzy when she is get that.  She did wear a monitor she pressed button 34 times.  Every single time it was sinus rhythm or sinus tachycardia no significant arrhythmia she is taking the Toprol 20 5 in the morning sometimes half tablet in the afternoon seems to be doing well from that point review also drinking plenty of fluids.  Past Medical History:  Diagnosis Date   Anxiety     Past Surgical History:  Procedure Laterality Date   double scope      Tub place in both ear  Bilateral    When she was 26 years of age   WISDOM TOOTH EXTRACTION  2006   All 4    Current Medications: Current Meds  Medication Sig   ALPRAZolam (XANAX XR) 0.5 MG 24 hr tablet Take 0.5 mg by mouth as needed for anxiety.   cetirizine (ZYRTEC) 10 MG tablet Take 20 mg by mouth daily.   EPINEPHrine 0.3 mg/0.3 mL IJ SOAJ injection Inject 0.3 mg into the muscle as needed for anaphylaxis.   famotidine (PEPCID) 40 MG tablet Take 40 mg by mouth 2 (two) times daily.   fexofenadine (ALLEGRA) 180 MG tablet Take 180 mg by mouth as needed for allergies or rhinitis.   hydrOXYzine (ATARAX) 25 MG tablet Take 25-50 mg by mouth every 8 (eight) hours as needed (allergies).   levonorgestrel (MIRENA) 20 MCG/24HR IUD 1 each by Intrauterine  route once.   linaclotide (LINZESS) 145 MCG CAPS capsule Take 1 capsule (145 mcg total) by mouth daily before breakfast.   metoprolol tartrate (LOPRESSOR) 25 MG tablet Take 25 mg by mouth 2 (two) times daily.   Multiple Vitamin (MULTIVITAMIN) tablet Take 1 tablet by mouth daily.   nortriptyline (PAMELOR) 25 MG capsule Take 75 mg by mouth daily after supper.   ondansetron (ZOFRAN) 4 MG tablet Take 1 tablet (4 mg total) by mouth every 6 (six) hours as needed for nausea or vomiting.   [DISCONTINUED] hyoscyamine (LEVSIN) 0.125 MG/ML solution Take 0.125 mg by mouth every 4 (four) hours as needed for bladder spasms.     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  Tobacco Use   Smoking status: Never   Smokeless tobacco: Not on file  Vaping Use   Vaping Use: Never used  Substance and Sexual Activity   Alcohol use: No   Drug use: Never   Sexual activity: Not on file    Comment: mirena  Other Topics Concern   Not on file  Social History Narrative   Not on file   Social Determinants of Health   Financial Resource Strain: Not on file  Food Insecurity: Not on file  Transportation Needs: Not on file  Physical Activity: Not on file  Stress: Not on file  Social Connections: Not on file     Family History: The patient's family history includes Atrial fibrillation in her father; Breast cancer in her paternal grandmother; Hypertension in her brother; Other in her sister. There is no history of Colon cancer, Stomach cancer, Liver cancer, or Pancreatic cancer. ROS:   Please see the history of present illness.    All 14 point review of systems negative except as described per history of present illness  EKGs/Labs/Other Studies  Reviewed:      Recent Labs: No results found for requested labs within last 8760 hours.  Recent Lipid Panel No results found for: CHOL, TRIG, HDL, CHOLHDL, VLDL, LDLCALC, LDLDIRECT  Physical Exam:    VS:  BP 110/72 (BP Location: Right Arm, Patient Position: Sitting)   Pulse 93   Ht 5\' 9"  (1.753 m)   Wt 236 lb (107 kg)   SpO2 97%   BMI 34.85 kg/m     Wt Readings from Last 3 Encounters:  08/17/21 236 lb (107 kg)  07/28/21 247 lb (112 kg)  07/17/21 247 lb (112 kg)     GEN:  Well nourished, well developed in no acute distress HEENT: Normal NECK: No JVD; No carotid bruits LYMPHATICS: No lymphadenopathy CARDIAC: RRR, no murmurs, no rubs, no gallops RESPIRATORY:  Clear to auscultation without rales, wheezing or rhonchi  ABDOMEN: Soft, non-tender, non-distended MUSCULOSKELETAL:  No edema; No deformity  SKIN: Warm and dry LOWER EXTREMITIES: no swelling NEUROLOGIC:  Alert and oriented x 3 PSYCHIATRIC:  Normal affect   ASSESSMENT:    1. Dysautonomia orthostatic hypotension syndrome   2. Inappropriate sinus tachycardia   3. Mast cell activation (HCC)    PLAN:    In order of problems listed above:  Inappropriate sinus tachycardia.  Seems to be controlled with small dose of beta-blocker.  Echocardiogram shows structurally normal heart with pathology. Dysautonomia.  She is started exercising on the regular basis she is drinking plenty of fluids seems to be doing quite well. There is a question about Ehlers-Danlos syndrome.  If she truly has it we probably need to do a CT of her chest and abdomen to make sure she does not have any vascular form of this disease.   Medication Adjustments/Labs and Tests Ordered: Current medicines are reviewed at length with the patient today.  Concerns regarding medicines are outlined above.  No orders of the defined types were placed in this encounter.  Medication changes: No orders of the defined types were placed in this  encounter.   Signed, 07/19/21, MD, [facility] 08/17/2021 9:19 AM    [facility]

## 2021-08-17 NOTE — Patient Instructions (Signed)

## 2021-08-22 ENCOUNTER — Ambulatory Visit (INDEPENDENT_AMBULATORY_CARE_PROVIDER_SITE_OTHER)
Admission: RE | Admit: 2021-08-22 | Discharge: 2021-08-22 | Disposition: A | Discharge status: Home / Self Care | Payer: 59 | Source: Ambulatory Visit | Attending: Gastroenterology | Admitting: Gastroenterology

## 2021-08-22 DIAGNOSIS — R194 Change in bowel habit: Secondary | ICD-10-CM | POA: Diagnosis not present

## 2021-08-22 DIAGNOSIS — R634 Abnormal weight loss: Secondary | ICD-10-CM | POA: Diagnosis not present

## 2021-08-22 DIAGNOSIS — R6881 Early satiety: Secondary | ICD-10-CM

## 2021-08-22 DIAGNOSIS — R110 Nausea: Secondary | ICD-10-CM | POA: Diagnosis not present

## 2021-08-22 DIAGNOSIS — R630 Anorexia: Secondary | ICD-10-CM

## 2021-08-22 DIAGNOSIS — K5900 Constipation, unspecified: Secondary | ICD-10-CM

## 2021-08-23 ENCOUNTER — Ambulatory Visit (INDEPENDENT_AMBULATORY_CARE_PROVIDER_SITE_OTHER): Payer: 59 | Admitting: Gastroenterology

## 2021-08-23 ENCOUNTER — Encounter: Payer: Self-pay | Admitting: Gastroenterology

## 2021-08-23 ENCOUNTER — Other Ambulatory Visit (INDEPENDENT_AMBULATORY_CARE_PROVIDER_SITE_OTHER): Payer: 59

## 2021-08-23 VITALS — BP 100/74 | HR 96 | Ht 69.0 in | Wt 233.2 lb

## 2021-08-23 DIAGNOSIS — K5900 Constipation, unspecified: Secondary | ICD-10-CM

## 2021-08-23 DIAGNOSIS — R110 Nausea: Secondary | ICD-10-CM | POA: Diagnosis not present

## 2021-08-23 DIAGNOSIS — R634 Abnormal weight loss: Secondary | ICD-10-CM

## 2021-08-23 DIAGNOSIS — R6881 Early satiety: Secondary | ICD-10-CM

## 2021-08-23 DIAGNOSIS — R630 Anorexia: Secondary | ICD-10-CM

## 2021-08-23 LAB — TSH: TSH: 2 u[IU]/mL (ref 0.35–5.50)

## 2021-08-23 MED ORDER — LINACLOTIDE 290 MCG PO CAPS: 290.0000 ug | ORAL_CAPSULE | Freq: Every day | ORAL | 4 refills | Status: DC

## 2021-08-23 NOTE — Patient Instructions (Addendum)
If you are age 26 or younger, your body mass index should be between 19-25. Your Body mass index is 34.45 kg/m. If this is out of the aformentioned range listed, please consider follow up with your Primary Care Provider.   __________________________________________________________  The [facility] providers would like to encourage you to use [facility] to communicate with providers for non-urgent requests or questions.  Due to long hold times on the telephone, sending your provider a message by [facility] may be a faster and more efficient way to get a response.  Please allow 48 business hours for a response.  Please remember that this is for non-urgent requests.   Due to recent changes in healthcare laws, you may see the results of your imaging and laboratory studies on MyChart before your provider has had a chance to review them.  We understand that in some cases there may be results that are confusing or concerning to you. Not all laboratory results come back in the same time frame and the provider may be waiting for multiple results in order to interpret others.  Please give [place] 48 hours in order for your provider to thoroughly review all the results before contacting the office for clarification of your results.   Your provider has requested that you go to the basement level for lab work before leaving today. Press "B" on the elevator. The lab is located at the first door on the left as you exit the elevator.   We have sent the following medications to your pharmacy for you to pick up at your convenience: Linzess   Please follow up in 2 months. Give [place] a call at [phone] to schedule an appointment.   Thank you for choosing me and [facility].  Vito Cirigliano, D.O.

## 2021-08-23 NOTE — Progress Notes (Signed)
Chief Complaint:    Constipation  GI History: 26 y.o. female with a history of hypermobility syndrome (?Ehlers-Danlos syndrome), anxiety, migraines, initially seen in the GI clinic on 07/17/2021 for evaluation of longstanding history of chronic constipation, and recent loss of appetite, nausea, and weight loss.  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  Can have episodes of diarrhea/loose stools.  Has used MiraLAX prn constipation, then Dulcolax if the MiraLAX is not efficacious. - 07/17/2021: Initial appointment in GI clinic - 07/28/2021: Colonoscopy: Large amounts of retained stool which precluded visualization and advancement of the colonoscope beyond the distal descending colon.  Limited visualized mucosa was otherwise normal (path benign).  Recommended repeat with extended 2-day prep and started on Linzess 145 mcg daily -08/22/2021: Sitz marker study positive with 21 retained markers, including 16 in the ascending colon and 5 in the distal colon/rectum.   Separately, developed nausea in 01/2021, then decreased appetite in 06/2021.  No emesis. - 07/28/2021: EGD: Normal  HPI:     Patient is a 26 y.o. female presenting to the Gastroenterology Clinic for follow-up and continued evaluation of chronic constipation.  Since colonoscopy on 07/28/2021 has had 2 BMs in total since starting Linzess.  He held his Linzess for sits marker study which was n/f retained markers as outlined above.  Still with decreased appetite and early satiety.  EGD earlier this month was largely unrevealing, to include no retained food in the stomach.   Review of systems:     No chest pain, no SOB, no fevers, no urinary sx   Past Medical History:  Diagnosis Date   Anxiety     Patient's surgical history, family medical history, social history, medications and allergies were all reviewed in Epic    Current Outpatient Medications  Medication Sig  Dispense Refill   ALPRAZolam (XANAX XR) 0.5 MG 24 hr tablet Take 0.5 mg by mouth as needed for anxiety.     cetirizine (ZYRTEC) 10 MG tablet Take 20 mg by mouth daily.     EPINEPHrine 0.3 mg/0.3 mL IJ SOAJ injection Inject 0.3 mg into the muscle as needed for anaphylaxis.     famotidine (PEPCID) 40 MG tablet Take 40 mg by mouth 2 (two) times daily.     fexofenadine (ALLEGRA) 180 MG tablet Take 180 mg by mouth as needed for allergies or rhinitis.     hydrOXYzine (ATARAX) 25 MG tablet Take 25-50 mg by mouth every 8 (eight) hours as needed (allergies).     levonorgestrel (MIRENA) 20 MCG/24HR IUD 1 each by Intrauterine route once.     linaclotide (LINZESS) 145 MCG CAPS capsule Take 1 capsule (145 mcg total) by mouth daily before breakfast. 30 capsule 1   metoprolol tartrate (LOPRESSOR) 25 MG tablet Take 25 mg by mouth 2 (two) times daily.     Multiple Vitamin (MULTIVITAMIN) tablet Take 1 tablet by mouth daily.     nortriptyline (PAMELOR) 25 MG capsule Take 75 mg by mouth daily after supper.     ondansetron (ZOFRAN) 4 MG tablet Take 1 tablet (4 mg total) by mouth every 6 (six) hours as needed for nausea or vomiting. 12 tablet 0   Riboflavin 100 MG TABS Take 200 mg by mouth daily in the afternoon.     No current facility-administered medications for this visit.    Physical Exam:     BP 100/74   Pulse 96   Ht 5\' 9"  (1.753 m)   Wt  233 lb 4 oz (105.8 kg)   BMI 34.45 kg/m   GENERAL:  Pleasant female in NAD PSYCH: : Cooperative, normal affect Musculoskeletal:  Normal muscle tone, normal strength NEURO: Alert and oriented x 3, no focal neurologic deficits   IMPRESSION and PLAN:    1) Chronic constipation - Increase Linzess to 290 mcg/day - If suboptimal response, restart Miralax 1 cap/day and Dulcolax prn, then if still no improvement, will trial Motegrity  - Check TSH - Colonoscopy with extended 2-day prep as scheduled - If still unable to improve symptoms, and pending colonoscopy  findings, plan for CT abdomen/pelvis and possible referral to [facility]   2) Early satiety 3) Weight loss 4) Decreased appetite 5) Nausea - EGD was essentially unrevealing for etiology, to include normal-appearing stomach without retained fluid and patent pylorus - Suspect UGI symptoms are related to significant constipation.  Will evaluate for clinical improvement with continued aggressive management of constipation as above. - Will cancel GES for the time being to work on LGI issues as the possible cause of UGI sxs, and do not think she can consume enough PO intake for sensitivity of test   - RTC after colonoscopy  The indications, risks, and benefits of colonoscopy were explained to the patient in detail. Risks include but are not limited to bleeding, perforation, adverse reaction to medications, and cardiopulmonary compromise. Sequelae include but are not limited to the possibility of surgery, hospitalization, and mortality. The patient verbalized understanding and wished to proceed. All questions answered, referred to the scheduler and bowel prep ordered. Further recommendations pending results of the exam.             Dominic Pea Katy Brickell ,DO, FACG 08/23/2021, 10:02 AM

## 2021-08-30 ENCOUNTER — Other Ambulatory Visit (HOSPITAL_COMMUNITY): Payer: 59

## 2021-08-30 ENCOUNTER — Ambulatory Visit (HOSPITAL_COMMUNITY): Payer: 59

## 2021-08-31 ENCOUNTER — Encounter: Payer: Self-pay | Admitting: Physical Therapy

## 2021-08-31 ENCOUNTER — Ambulatory Visit: Payer: 59 | Attending: Family Medicine | Admitting: Physical Therapy

## 2021-08-31 DIAGNOSIS — M357 Hypermobility syndrome: Secondary | ICD-10-CM

## 2021-08-31 DIAGNOSIS — M2550 Pain in unspecified joint: Secondary | ICD-10-CM

## 2021-08-31 DIAGNOSIS — R2689 Other abnormalities of gait and mobility: Secondary | ICD-10-CM | POA: Diagnosis present

## 2021-08-31 NOTE — Therapy (Signed)
OUTPATIENT PHYSICAL THERAPY TREATMENT NOTE RENEWAL    Patient Name: Shelby Dyer MRN: [id number] DOB:1996/01/09, 26 y.o., female Today's Date: 08/31/2021  PCP: Legrand Como, NP REFERRING PROVIDER: Legrand Como, NP   PT End of Session - 08/31/21 0856     Visit Number 8    Number of Visits 16    Authorization Type BCBS    PT Start Time [phone]    PT Stop Time 0931    PT Time Calculation (min) 39 min                Past Medical History:  Diagnosis Date   Anxiety    Past Surgical History:  Procedure Laterality Date   double scope      Tub place in both ear  Bilateral    When she was 26 years of age   80 TOOTH EXTRACTION  2006   All 4   Patient Active Problem List   Diagnosis Date Noted   Hip pain 06/13/2021   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   Dysautonomia orthostatic hypotension syndrome 05/17/2021   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  Give seated rest breaks for standing, blood pools in LE   SUBJECTIVE:   My dog knocked me down and my knees are hurting today. Hips and back are fine today.   PAIN:  Are you having pain? Yes:  NPRS scale: 5/10 rated today.  Pain location: bilateral knees Pain description: achy Aggravating factors: activity. Back pain (standing long time) and carrying item in R hand Relieving factors: meds, heating pad, rest    OBJECTIVE:    DIAGNOSTIC  FINDINGS:  CT last yet   PATIENT SURVEYS:  Bristol Impact Scale  given on eval    COGNITION:            Overall cognitive status: Within functional limits for tasks assessed                          SENSATION: Reports hands, feet in the past but improved with med change  Amrs and hands sleeping AM frequently   MUSCLE LENGTH: Hamstrings: tight   POSTURE:  Forward head, rounded shoulders   PALPATION: NT   LUMBAR ROM:    Active  A/PROM  06/12/2021  Flexion Palms to floor  Extension Hyper  Right lateral flexion    Left lateral flexion    Right rotation Hyper  Left rotation Hyper    (Blank rows = not tested)    CERVICAL ROM: 07/20/21 Extension 75 deg Flexion 40 capital flexion , 60 cervical flexion  Rt sidebending 55 Lt sidebending 49 Rt rot 75  Lt. rot 82 Pain minimal with AROM of C-spine, senses clicking with extension  LE ROM:   Active  Right 06/12/2021 Left 06/12/2021  Hip flexion      Hip extension      Hip abduction      Hip adduction      Hip internal  rotation Rel tight Rel tight   Hip external rotation WNL WNL  Knee flexion      Knee extension      Ankle dorsiflexion      Ankle plantarflexion      Ankle inversion      Ankle eversion       (Blank rows = not tested)   LE MMT:   MMT Right 06/12/2021 Left 06/12/2021 Rt  08/10/21 Lt.  08/10/21  Hip flexion 4+ 4+ 5 5  Hip extension 4- 4-    Hip abduction 4 4    Hip adduction        Hip internal rotation        Hip external rotation        Knee flexion 4+ 4 5 5   Knee extension 4+ 4+ 5 5  Ankle dorsiflexion 4+ 4+    Ankle plantarflexion        Ankle inversion        Ankle eversion         (Blank rows = not tested)   FUNCTIONAL TESTS:  2 minute walk test: 412 feet , HR 120 max Sa02 99% 14.3 5 x STS ( 07/07/21) Tandem stance 6 sec RLE back (07/07/21) Tandem stance 8 sec LLE back  (07/07/21)  SLS 4-5 sec best bilat  DGI (21/24) -taken 07/07/21      BERG BALANCE  Sitting to Standing: Numbers;  0-4: 4  4. Stands without using hands and stabilize independently  3. Stands independently using hands  2. Stands using hands after multiple trials  1. Min A to stand  0. Mod-Max A to stand Standing unsupported: Numbers; 0-4: 4  4. Stands safely for 2 minutes  3. Stands 2 minutes with supervision  2. Stands 30 seconds unsupported  1. Needs several tries to stand unsupported for 30 seconds  0. Unable to stand unsupported for 30 seconds Sitting unsupported: Numbers; 0-4: 4  4. Sits for 2 minutes independently  3. Sits for 2 minutes with supervision  2. Able to sit 30 seconds  1. Able to sit 10 seconds  0. Unable to sit for 10 seconds Standing to Sitting: Numbers; 0-4: 4 4. Sits safely with minimal use of hands 3. Controls descent with hands 2. Uses back of legs against chair to control descent 1. Sits independently, but uncontrolled descent 0. Needs assistance Transfers: Numbers; 0-4: 4  4. Transfers safely with minor use of hands  3. Transfers safely definite use of hands  2. Transfers with verbal cueing/supervision  1. Needs 1 person assist  0. Needs 2 person assist  Standing with eyes closed: Numbers; 0-4: 4  4. Stands safely for 10 seconds  3. Stands 10 seconds with supervision   2. Able to stand for 3 seconds  1. Unable to keep eyes closed for 3 seconds, but is safe  0. Needs assist to keep from falling Standing with feet together: Numbers; 0-4: 3 incr postural sway 4. Stands for 1 minute safely 3. Stands for 1 minute with supervision 2. Unable to hold for 30 seconds  1. Needs help to attain position but can hold for 15 seconds  0. Needs help to attain position and unable to hold for 15 seconds Reaching forward with outstretched arm: Numbers; 0-4: 4  4. Reaches forward 10 inches  3. Reaches forward 5 inches  2. Reaches forward 2 inches  1. Reaches forward with supervision  0. Loses balance/requires assistace Retrieving object from the floor: Numbers; 0-4: 4  4. Able  to pick up easily and safely 3. Able to pick up with supervision 2. Unable to pick up, but reaches within 1-2 inches independently 1. Unable to pick up and needs supervision 0. Unable/needs assistance to keep from falling  Turning to look behind: Numbers; 0-4: 4  4. Looks behind from both sides and weight shifts well  3. Looks behind one side only, other side less weight shift  2. Turns sideways only, maintains balance  1. Needs supervision when turning  0. Needs assistance  Turning 360 degrees: Numbers; 0-4: 4  4. Able to turn in </=4 seconds  3. Able to turn on one side in </= 4 seconds   2. Able to turn slowly, but safely  1. Needs supervision or verbal cueing  0. Needs assistance Place alternate foot on stool: Numbers; 0-4: 4 4. Completes 8 steps in 20 seconds 3. Completes 8 steps in >20 seconds 2. 4 steps without assistance/supervision 1. Completes >2 steps with minimal assist 0. Unable, needs assist to keep from falling Standing with one foot in front: Numbers; 0-4: 4  4. Independent tandem for 30 seconds  3. Independent foot ahead for 30 seconds  2. Independent small step for 30 seconds  1. Needs help to step, but can hold for 15 seconds  0. Loses balance while standing/stepping Standing on one foot: Numbers; 0-4: 4 4. Holds >10 seconds 3. Holds 5-10 seconds 2. Holds >/=3 seconds  1. Holds <3 seconds 0. Unable   Total Score: 55/56  GAIT: Distance walked: 150 Assistive device utilized: None Level of assistance: Complete Independence Comments: none    Today's Treatment  [facility] Adult PT Treatment:                                                DATE: 08/31/21 Therapeutic Exercise: STS 15# 10 x 3  SLS 28 left, 35 right  Blue rocker A/P bilat Ue to 0 UE- difficult Blue Rocker Lateral weight shifting bilat UE to 0 UE -difficult  Tandem with head turns Tandem with EC SLS on foam oval , 3 sec best R and L    [facility] Adult PT Treatment:                                                 DATE: 08/10/21 Therapeutic Exercise: Sit to stand x 15 with 10 lbs  HR 140's  Heel raises 10 lbs x 15  Chest press 10 lbs x 10 with KB Sit to stand with chest press combo 10 lbs (Sa02 86 post ex, quickly went o up to 98%) Hip hinge dead lift 10 lbs x 15  See above for [employer] Test    [facility] Adult PT Treatment:                                                DATE: 08/01/21 Therapeutic Exercise: Rt LE quad set x 10 to SLR x 10  Ball bridging x 10  Ball hamstring curl x 10  Bent knee partial bridge combo x 10 Partial bridge with ball and  blue band horizontal pull x 10  Sidelying clam and hip abduction blue band  Standing spring board shoulder extension x 15  Sidestepping iso ER at 0 deg 1 min ER and IR blue band x 15  Standing fly bilateral x 15 , then unilateral x 5     [facility] Adult PT Treatment:                                                DATE: 07/20/21 Therapeutic Exercise: Scapular, cervical, lumbopelvic stability  Cervical retraction x 10 used blue soft ball  Added slight rotation x 10 each side  Supine Tr A with march  then SLR x 10 each side  Arm lift with SLR for neutral core x 10  Narrow grip looped band overhead lift x 15  ER green looped band small ROM x 10  Ball squeeze x 10  Ball squeeze with bridging x 10 Feet on ball for destabilization , bent knee fall out x 10  Self Care: AROM of cervical spine and comparison to norms   [facility] Adult PT Treatment:                                                DATE: 07/11/21 Therapeutic Exercise: Pilates Tower for LE/Core strength, postural strength, lumbopelvic disassociation and core control.  Exercises included: Isometric Tr A and breathing  x 10 Bridge with ball squeeze x 10  Supine Arm Springs x 10 with ball squeeze Supine arms with alternating knee lift x 10 Bridge with Arm springs  x 10  Supine leg springs yellow single leg arcs and hip circles  Double leg arcs in parallel and turnout Double hip circles,  small ROM x 5 each direction  Self Care: Knee proprioception with transition to walking from sitting    Initial  TREATMENT  Eval, intro, POC      PATIENT EDUCATION:  Education details: HEP Person educated: Patient Education method: Explanation Education comprehension: verbalized understanding     HOME EXERCISE PROGRAM: Access Code: [email or web address] URL: [email or web address] Date: 07/07/2021 Prepared by: Hessie Diener  Exercises - Supine Bridge with Spinal Articulation  - 1 x daily - 7 x weekly - 2 sets - 10 reps - Bent Knee Fallouts with Alternating Legs  - 1 x daily - 7 x weekly - 2 sets - 10 reps - Supine March  - 1 x daily - 7 x weekly - 2 sets - 10 reps - Standing Tandem Balance with Counter Support  - 1 x daily - 7 x weekly - 1 sets - 3 reps - 30 hold - Standing Single Leg Stance with Counter Support  - 1 x daily - 7 x weekly - 1 sets - 3 reps - 30 hold - shoulder added horizontal abd and ER green or blue  ASSESSMENT:   CLINICAL IMPRESSION: Pt continues to benefit from skilled PT to focus on stability to spine and postural strengthening.  Challenged unlevel surface and dynamic balance with pt demonstrating decreased stability and difficulty without UE support.  She would like to cont to progress and incorporate more functional strengthening into her POC.     OBJECTIVE IMPAIRMENTS cardiopulmonary status limiting activity, decreased activity tolerance, decreased balance, decreased coordination,  decreased endurance, decreased mobility, difficulty walking, decreased ROM, decreased strength, dizziness, obesity, and pain.    ACTIVITY LIMITATIONS cleaning, community activity, meal prep, occupation, laundry, yard work, and shopping.    PERSONAL FACTORS Past/current experiences, Time since onset of injury/illness/exacerbation, and 3+ comorbidities: chronic joint pain, hypersomnelence, migraines, POTS, MCAS  are also affecting patient's functional outcome.      REHAB  POTENTIAL: Excellent   CLINICAL DECISION MAKING: Stable/uncomplicated   EVALUATION COMPLEXITY: Low     GOALS: Goals reviewed with patient? No   SHORT TERM GOALS: Target date: 07/03/2021   Pt will be I with HEP for core, pelvic stability  Baseline: unknown Goal status: MET   2.  Pt will complete Survey for hypermobility [facility] Impact Scale) and set goal.  Baseline: given on eval  Goal status: ONGOING   3.  Pt will report increased tolerance for work, home activities, reporting less fatigue overall with 1-2 tasks  Baseline: low energy , hard to do > 1 simple home task  Goal status: ONGOING due to other GI issues    4  Pt will be screened for balance (DGI) and goal set 24/24.  Baseline: 21/24  Goal status: ongoing    LONG TERM GOALS: Target date: 08/07/2021   Pt will be able to walk 600 feet or more on the 2 min walk test to demo improved exercise tolerance  Baseline: 434 feet  Goal status: ongoing   2.  Pt will understand and demo proper posture and body mechanics as it pertains to lifting and squatting for work, home tasks  Baseline: needs reinforcement Goal status: ongoing    3.  Pt will be able to walk on uneven ground with improved confidence in balance  Baseline: loses balance  Goal status: ongoing , balance screens done    4.  Pt will demo 5/5 strength in UE and LE for maximal joint support  Baseline:4+/5 to 5/5 in LEs  Goal status: INITIAL   5.  Pt will be able to stand on tandem , SLS for light balance work for 30 sec at a time , stable surface Baseline: unable > 10 sec  Status 08/10/21: held tandem 30 sec Goal status: PARTIALLY MET          PLAN: PT FREQUENCY: 1x/week   PT DURATION: 8 weeks   PLANNED INTERVENTIONS: Therapeutic exercises, Therapeutic activity, Neuromuscular re-education, Balance training, Gait training, Patient/Family education, Joint mobilization, Aquatic Therapy, Electrical stimulation, Cryotherapy, Moist heat, Taping, and Manual  therapy.   PLAN FOR NEXT SESSION:  Review HEP, balance challenges, squat progression    Raeford Razor, PT 08/31/21 9:39 AM Phone: [phone] Fax: [phone]

## 2021-09-04 NOTE — Therapy (Signed)
OUTPATIENT PHYSICAL THERAPY TREATMENT NOTE/ RENEWAL   Patient Name: Shelby Dyer MRN: [id number] DOB:[birth year], 26 y.o., female Today's Date: 09/05/2021  PCP: Legrand Como, NP REFERRING PROVIDER: Legrand Como, NP   PT End of Session - 09/05/21 1232     Visit Number 9    Number of Visits 16    Date for PT Re-Evaluation 10/31/21    Authorization Type BCBS    PT Start Time 1200    PT Stop Time 1238    PT Time Calculation (min) 38 min    Activity Tolerance Patient tolerated treatment well    Behavior During Therapy WFL for tasks assessed/performed                 Past Medical History:  Diagnosis Date   Anxiety    Past Surgical History:  Procedure Laterality Date   double scope      Tub place in both ear  Bilateral    When she was 26 years of age   107 TOOTH EXTRACTION  2006   All 4   Patient Active Problem List   Diagnosis Date Noted   Hip pain 06/13/2021   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   Dysautonomia orthostatic hypotension syndrome 05/17/2021   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  Give seated rest breaks for standing, blood pools in LE   SUBJECTIVE:   Pt arrived late.  Knees and hips are feeling "wobbly". Lat time we worked together she had increased back pain for about 6-8 hour after.     PAIN:  Are you having pain? Yes:  NPRS scale: 5/10 rated today.  Pain  location: bilateral knees Pain description: achy Aggravating factors: activity. Back pain (standing long time) and carrying item in R hand Relieving factors: meds, heating pad, rest    OBJECTIVE:    DIAGNOSTIC FINDINGS:  CT last yet   PATIENT SURVEYS:  [place] Impact Scale  given on eval    COGNITION:            Overall cognitive status: Within functional limits for tasks assessed                          SENSATION: Reports hands, feet in the past but improved with med change  Amrs and hands sleeping AM frequently   MUSCLE LENGTH: Hamstrings: tight   POSTURE:  Forward head, rounded shoulders   PALPATION: NT   LUMBAR ROM:    Active  A/PROM  06/12/2021  Flexion Palms to floor  Extension Hyper  Right lateral flexion    Left lateral flexion    Right rotation Hyper  Left rotation Hyper    (Blank rows = not tested)    CERVICAL ROM: 07/20/21 Extension 75 deg Flexion 40 capital flexion , 60 cervical flexion  Rt sidebending 55 Lt sidebending 49 Rt rot 75  Lt. rot 82 Pain minimal with AROM of C-spine, senses clicking with extension  LE ROM:  Active  Right 06/12/2021 Left 06/12/2021  Hip flexion      Hip extension      Hip abduction      Hip adduction      Hip internal rotation Rel tight Rel tight   Hip external rotation WNL WNL  Knee flexion      Knee extension      Ankle dorsiflexion      Ankle plantarflexion      Ankle inversion      Ankle eversion       (Blank rows = not tested)   LE MMT:   MMT Right 06/12/2021 Left 06/12/2021 Rt  08/10/21 Lt.  08/10/21  Hip flexion 4+ 4+ 5 5  Hip extension 4- 4-    Hip abduction 4 4    Hip adduction        Hip internal rotation        Hip external rotation        Knee flexion 4+ _0 Knee extension 4+ 4+ 5 5  Ankle dorsiflexion 4+ 4+    Ankle plantarflexion        Ankle inversion        Ankle eversion         (Blank rows = not tested)   FUNCTIONAL TESTS:  2 minute walk test: 412 feet , HR 120 max Sa02  99% 14.3 5 x STS ( 07/07/21) Tandem stance 6 sec RLE back (07/07/21) Tandem stance 8 sec LLE back  (07/07/21)  SLS 4-5 sec best bilat  DGI (21/24) -taken 07/07/21     GAIT: Distance walked: 150 Assistive device utilized: None Level of assistance: Complete Independence Comments: none    Today's Treatment   [facility] Adult PT Treatment:                                                DATE: 09/05/21 Therapeutic Exercise: Sit to stand x 10 with 15 lbs Squats x 10 x 15 lbs x 2  Hip hinge x 10 x 15 lbs x 2  Single leg RDL x 15 used wall for support, no UE for about 5 reps each side  Single leg RDL with 5 lbs and wall x 10  Single leg 5 lbs infinity motion with 30 sec  Overhead shoulder flexion hold 5 lb x 30 sec with high knees march  Foam roller alternating arms x 10 , then bilateral horizontal abduction x 10  Foam roller core: clam (double leg) then unilateral  x 10 each  Dead bug foam roller x 10   [facility] Adult PT Treatment:                                                DATE: 08/31/21 Therapeutic Exercise: STS 15# 10 x 3  SLS 28 left, 35 right  Blue rocker A/P bilat Ue to 0 UE- difficult Blue Rocker Lateral weight shifting bilat UE to 0 UE -difficult  Tandem with head turns Tandem with EC SLS on foam oval , 3 sec best R and L    [facility] Adult PT Treatment:  DATE: 08/10/21 Therapeutic Exercise: Sit to stand x 15 with 10 lbs  HR 140's  Heel raises 10 lbs x 15  Chest press 10 lbs x 10 with KB Sit to stand with chest press combo 10 lbs (Sa02 86 post ex, quickly went o up to 98%) Hip hinge dead lift 10 lbs x 15  See previous note  for [employer] Test  55/56       PATIENT EDUCATION:  Education details: HEP Person educated: Patient Education method: Explanation Education comprehension: verbalized understanding     HOME EXERCISE PROGRAM: Access Code: [email or web address] URL: [email or web address] Date: 07/07/2021 Prepared by:  Hessie Diener  Exercises - Supine Bridge with Spinal Articulation  - 1 x daily - 7 x weekly - 2 sets - 10 reps - Bent Knee Fallouts with Alternating Legs  - 1 x daily - 7 x weekly - 2 sets - 10 reps - Supine March  - 1 x daily - 7 x weekly - 2 sets - 10 reps - Standing Tandem Balance with Counter Support  - 1 x daily - 7 x weekly - 1 sets - 3 reps - 30 hold - Standing Single Leg Stance with Counter Support  - 1 x daily - 7 x weekly - 1 sets - 3 reps - 30 hold - shoulder added horizontal abd and ER green or blue  ASSESSMENT:   CLINICAL IMPRESSION: Progressed lifting and and incorporated more functional strengthening today.  She does show good form with lifting but needs cues to retrieve objects from the floor with caution, also tends to move quickly. She did have an adverse effect from lifing session 08/10/21 so we made sure not to overdo it with pacing.  She will cont to benefit from stability and strength training for joint support and to improve overall energy levels.    OBJECTIVE IMPAIRMENTS cardiopulmonary status limiting activity, decreased activity tolerance, decreased balance, decreased coordination, decreased endurance, decreased mobility, difficulty walking, decreased ROM, decreased strength, dizziness, obesity, and pain.    ACTIVITY LIMITATIONS cleaning, community activity, meal prep, occupation, laundry, yard work, and shopping.    PERSONAL FACTORS Past/current experiences, Time since onset of injury/illness/exacerbation, and 3+ comorbidities: chronic joint pain, hypersomnelence, migraines, POTS, MCAS  are also affecting patient's functional outcome.      REHAB POTENTIAL: Excellent   CLINICAL DECISION MAKING: Stable/uncomplicated   EVALUATION COMPLEXITY: Low     GOALS: Goals reviewed with patient? No   SHORT TERM GOALS: Target date: 07/03/2021   Pt will be I with HEP for core, pelvic stability  Baseline: unknown Goal status: MET   2.  Pt will complete Survey for  hypermobility [facility] Impact Scale) and set goal.  Baseline: given on eval  Goal status: ONGOING   3.  Pt will report increased tolerance for work, home activities, reporting less fatigue overall with 1-2 tasks  Baseline: low energy , hard to do > 1 simple home task  Goal status: ONGOING due to other GI issues , HR variability    4  Pt will be screened for balance (DGI) and goal set 24/24.  Baseline: 21/24  Goal status: MET    LONG TERM GOALS: Target date: 08/07/2021   Pt will be able to walk 600 feet or more on the 2 min walk test to demo improved exercise tolerance  Baseline: 434 feet  Goal status: ongoing   2.  Pt will understand and demo proper posture and body mechanics as it pertains to lifting and squatting for work,  home tasks  Baseline: needs reinforcement Goal status: ongoing    3.  Pt will be able to walk on uneven ground with improved confidence in balance  Baseline: loses balance  Goal status: ongoing , balance screens done    4.  Pt will demo 5/5 strength in UE and LE for maximal joint support  Baseline:4+/5 to 5/5 in LEs  Goal status: INITIAL   5.  Pt will be able to stand on tandem , SLS for light balance work for 30 sec at a time , stable surface Baseline: unable > 10 sec  Status 08/10/21: held tandem 30 sec Goal status: PARTIALLY MET       6. Patient will recover from PT session back to baseline level day after PT session. Baseline: Needs 1-2 days  Status: NEW       PLAN: PT FREQUENCY: 1x/week   PT DURATION: 8 weeks   PLANNED INTERVENTIONS: Therapeutic exercises, Therapeutic activity, Neuromuscular re-education, Balance training, Gait training, Patient/Family education, Joint mobilization, Aquatic Therapy, Electrical stimulation, Cryotherapy, Moist heat, Taping, and Manual therapy.   PLAN FOR NEXT SESSION:  Review HEP, balance challenges, squat progression    Raeford Razor, PT 09/05/21 12:51 PM Phone: [phone] Fax: [phone]

## 2021-09-05 ENCOUNTER — Ambulatory Visit: Payer: 59 | Admitting: Physical Therapy

## 2021-09-05 DIAGNOSIS — M2550 Pain in unspecified joint: Secondary | ICD-10-CM

## 2021-09-05 DIAGNOSIS — M357 Hypermobility syndrome: Secondary | ICD-10-CM | POA: Diagnosis not present

## 2021-09-05 DIAGNOSIS — R2689 Other abnormalities of gait and mobility: Secondary | ICD-10-CM

## 2021-09-12 ENCOUNTER — Encounter: Payer: Self-pay | Admitting: Gastroenterology

## 2021-09-12 ENCOUNTER — Ambulatory Visit (AMBULATORY_SURGERY_CENTER): Payer: 59 | Admitting: Gastroenterology

## 2021-09-12 VITALS — BP 114/69 | HR 66 | Temp 97.3°F | Resp 13 | Ht 69.0 in | Wt 233.0 lb

## 2021-09-12 DIAGNOSIS — K5900 Constipation, unspecified: Secondary | ICD-10-CM | POA: Diagnosis not present

## 2021-09-12 DIAGNOSIS — R194 Change in bowel habit: Secondary | ICD-10-CM

## 2021-09-12 MED ORDER — SODIUM CHLORIDE 0.9 % IV SOLN: 500.0000 mL | INTRAVENOUS | Status: DC

## 2021-09-12 NOTE — Patient Instructions (Signed)
YOU HAD AN ENDOSCOPIC PROCEDURE TODAY AT THE [facility]:   Refer to the procedure report that was given to you for any specific questions about what was found during the examination.  If the procedure report does not answer your questions, please call your gastroenterologist to clarify.  If you requested that your care partner not be given the details of your procedure findings, then the procedure report has been included in a sealed envelope for you to review at your convenience later.  YOU SHOULD EXPECT: Some feelings of bloating in the abdomen. Passage of more gas than usual.  Walking can help get rid of the air that was put into your GI tract during the procedure and reduce the bloating. If you had a lower endoscopy (such as a colonoscopy or flexible sigmoidoscopy) you may notice spotting of blood in your stool or on the toilet paper. If you underwent a bowel prep for your procedure, you may not have a normal bowel movement for a few days.  Please Note:  You might notice some irritation and congestion in your nose or some drainage.  This is from the oxygen used during your procedure.  There is no need for concern and it should clear up in a day or so.  SYMPTOMS TO REPORT IMMEDIATELY:  Following lower endoscopy (colonoscopy or flexible sigmoidoscopy):  Excessive amounts of blood in the stool  Significant tenderness or worsening of abdominal pains  Swelling of the abdomen that is new, acute  Fever of 100F or higher   Discharge instructions given. Return to GI office at appointment to be scheduled by office. Continue Linzee 290 mcg/day for now and restart Miralax 1 cap /day and Dulcolax prn. Resume previous medications.    For urgent or emergent issues, a gastroenterologist can be reached at any hour by calling [phone]. Do not use MyChart messaging for urgent concerns.    DIET:  We do recommend a small meal at first, but then you may proceed to your regular diet.  Drink  plenty of fluids but you should avoid alcoholic beverages for 24 hours.  ACTIVITY:  You should plan to take it easy for the rest of today and you should NOT DRIVE or use heavy machinery until tomorrow (because of the sedation medicines used during the test).    FOLLOW UP: Our staff will call the number listed on your records 24-72 hours following your procedure to check on you and address any questions or concerns that you may have regarding the information given to you following your procedure. If we do not reach you, we will leave a message.  We will attempt to reach you two times.  During this call, we will ask if you have developed any symptoms of COVID 19. If you develop any symptoms (ie: fever, flu-like symptoms, shortness of breath, cough etc.) before then, please call [phone].  If you test positive for Covid 19 in the 2 weeks post procedure, please call and report this information to [place].    If any biopsies were taken you will be contacted by phone or by letter within the next 1-3 weeks.  Please call us at [phone] if you have not heard about the biopsies in 3 weeks.    SIGNATURES/CONFIDENTIALITY: You and/or your care partner have signed paperwork which will be entered into your electronic medical record.  These signatures attest to the fact that that the information above on your After Visit Summary has been reviewed and is understood.  Full responsibility of the confidentiality of this discharge information lies with you and/or your care-partner.  

## 2021-09-12 NOTE — Progress Notes (Unsigned)
Sedate, gd SR, tolerated procedure well, VSS, report to RN 

## 2021-09-12 NOTE — Therapy (Unsigned)
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  PERTINENT HISTORY: Migraines, hypermobility  MCAS, tachycardia, IBS Her mother has EDS and her sister has POTS  PRECAUTIONS: Other: POTS , positional changes.  Give seated rest breaks for standing, blood pools in LE   SUBJECTIVE:   Pt arrived late.  Knees and hips are feeling "wobbly". Lat time we worked together she had increased back pain for about 6-8 hour after.     PAIN:  Are you having pain? Yes:  NPRS scale: 5/10 rated today.  Pain location: bilateral knees Pain description: achy Aggravating factors: activity. Back pain (standing long time) and carrying item in R hand Relieving factors: meds, heating pad, rest    OBJECTIVE:    DIAGNOSTIC FINDINGS:  CT last yet   PATIENT SURVEYS:  [place] Impact Scale  given on eval    COGNITION:            Overall cognitive status: Within functional limits for tasks assessed                           SENSATION: Reports hands, feet in the past but improved with med change  Amrs and hands sleeping AM frequently   MUSCLE LENGTH: Hamstrings: tight   POSTURE:  Forward head, rounded shoulders   PALPATION: NT   LUMBAR ROM:    Active  A/PROM  06/12/2021  Flexion Palms to floor  Extension Hyper  Right lateral flexion    Left lateral flexion    Right rotation Hyper  Left rotation Hyper    (Blank rows = not tested)    CERVICAL ROM: 07/20/21 Extension 75 deg Flexion 40 capital flexion , 60 cervical flexion  Rt sidebending 55 Lt sidebending 49 Rt rot 75  Lt. rot 82 Pain minimal with AROM of C-spine, senses clicking with extension  LE ROM:   Active  Right 06/12/2021 Left 06/12/2021  Hip flexion      Hip extension      Hip abduction      Hip adduction      Hip internal rotation Rel tight Rel tight   Hip external rotation WNL WNL  Knee flexion      Knee extension      Ankle dorsiflexion      Ankle plantarflexion      Ankle inversion      Ankle eversion       (Blank rows =  not tested)   LE MMT:   MMT Right 06/12/2021 Left 06/12/2021 Rt  08/10/21 Lt.  08/10/21  Hip flexion 4+ 4+ 5 5  Hip extension 4- 4-    Hip abduction 4 4    Hip adduction        Hip internal rotation        Hip external rotation        Knee flexion 4+ 4 5 5   Knee extension 4+ 4+ 5 5  Ankle dorsiflexion 4+ 4+    Ankle plantarflexion        Ankle inversion        Ankle eversion         (Blank rows = not tested)   FUNCTIONAL TESTS:  2 minute walk test: 412 feet , HR 120 max Sa02 99% 14.3 5 x STS ( 07/07/21) Tandem stance 6 sec RLE back (07/07/21) Tandem stance 8 sec LLE back  (07/07/21)  SLS 4-5 sec best bilat  DGI (21/24) -taken 07/07/21     GAIT: Distance walked: 150 Assistive device utilized: None Level of assistance: Complete Independence Comments: none    Today's Treatment  [facility] Adult PT Treatment:                                                DATE: 09/13/21 Therapeutic  Exercise: *** Manual Therapy: *** Neuromuscular re-ed: *** Therapeutic Activity: *** Modalities: *** Self Care: ***  [facility] Adult PT Treatment:                                                DATE: 09/05/21 Therapeutic Exercise: Sit to stand x 10 with 15 lbs Squats x 10 x 15 lbs x 2  Hip hinge x 10 x 15 lbs x 2  Single leg RDL x 15 used wall for support, no UE for about 5 reps each side  Single leg RDL with 5 lbs and wall x 10  Single leg 5 lbs infinity motion with 30 sec  Overhead shoulder flexion hold 5 lb x 30 sec with high knees march  Foam roller alternating arms x 10 , then bilateral horizontal abduction x 10  Foam roller core: clam (double leg) then unilateral  x 10 each  Dead bug foam roller x 10   [facility] Adult PT Treatment:                                                DATE: 08/31/21 Therapeutic Exercise: STS 15# 10 x 3  SLS 28 left, 35 right  Blue rocker A/P bilat Ue to 0 UE- difficult [place] Lateral weight shifting bilat UE to 0 UE -difficult  Tandem with head turns Tandem with EC SLS on foam oval , 3 sec best R and L    [facility] Adult PT Treatment:  DATE: 08/10/21 Therapeutic Exercise: Sit to stand x 15 with 10 lbs  HR 140's  Heel raises 10 lbs x 15  Chest press 10 lbs x 10 with KB Sit to stand with chest press combo 10 lbs (Sa02 86 post ex, quickly went o up to 98%) Hip hinge dead lift 10 lbs x 15  See previous note  for [employer] Test  55/56       PATIENT EDUCATION:  Education details: HEP Person educated: Patient Education method: Explanation Education comprehension: verbalized understanding     HOME EXERCISE PROGRAM: Access Code: [email or web address] URL: [email or web address] Date: 07/07/2021 Prepared by: Hessie Diener  Exercises - Supine Bridge with Spinal Articulation  - 1 x daily - 7 x weekly - 2 sets - 10 reps - Bent Knee Fallouts with Alternating Legs  - 1 x daily - 7 x weekly - 2 sets -  10 reps - Supine March  - 1 x daily - 7 x weekly - 2 sets - 10 reps - Standing Tandem Balance with Counter Support  - 1 x daily - 7 x weekly - 1 sets - 3 reps - 30 hold - Standing Single Leg Stance with Counter Support  - 1 x daily - 7 x weekly - 1 sets - 3 reps - 30 hold - shoulder added horizontal abd and ER green or blue  ASSESSMENT:   CLINICAL IMPRESSION: Progressed lifting and and incorporated more functional strengthening today.  She does show good form with lifting but needs cues to retrieve objects from the floor with caution, also tends to move quickly. She did have an adverse effect from lifing session 08/10/21 so we made sure not to overdo it with pacing.  She will cont to benefit from stability and strength training for joint support and to improve overall energy levels.    OBJECTIVE IMPAIRMENTS cardiopulmonary status limiting activity, decreased activity tolerance, decreased balance, decreased coordination, decreased endurance, decreased mobility, difficulty walking, decreased ROM, decreased strength, dizziness, obesity, and pain.    ACTIVITY LIMITATIONS cleaning, community activity, meal prep, occupation, laundry, yard work, and shopping.    PERSONAL FACTORS Past/current experiences, Time since onset of injury/illness/exacerbation, and 3+ comorbidities: chronic joint pain, hypersomnelence, migraines, POTS, MCAS  are also affecting patient's functional outcome.      REHAB POTENTIAL: Excellent   CLINICAL DECISION MAKING: Stable/uncomplicated   EVALUATION COMPLEXITY: Low     GOALS: Goals reviewed with patient? No   SHORT TERM GOALS: Target date: 07/03/2021   Pt will be I with HEP for core, pelvic stability  Baseline: unknown Goal status: MET   2.  Pt will complete Survey for hypermobility [facility] Impact Scale) and set goal.  Baseline: given on eval  Goal status: ONGOING   3.  Pt will report increased tolerance for work, home activities, reporting less fatigue overall  with 1-2 tasks  Baseline: low energy , hard to do > 1 simple home task  Goal status: ONGOING due to other GI issues , HR variability    4  Pt will be screened for balance (DGI) and goal set 24/24.  Baseline: 21/24  Goal status: MET    LONG TERM GOALS: Target date: 08/07/2021   Pt will be able to walk 600 feet or more on the 2 min walk test to demo improved exercise tolerance  Baseline: 434 feet  Goal status: ongoing   2.  Pt will understand and demo proper posture and body mechanics as it pertains to lifting and squatting for work,  home tasks  Baseline: needs reinforcement Goal status: ongoing    3.  Pt will be able to walk on uneven ground with improved confidence in balance  Baseline: loses balance  Goal status: ongoing , balance screens done    4.  Pt will demo 5/5 strength in UE and LE for maximal joint support  Baseline:4+/5 to 5/5 in LEs  Goal status: INITIAL   5.  Pt will be able to stand on tandem , SLS for light balance work for 30 sec at a time , stable surface Baseline: unable > 10 sec  Status 08/10/21: held tandem 30 sec Goal status: PARTIALLY MET       6. Patient will recover from PT session back to baseline level day after PT session. Baseline: Needs 1-2 days  Status: NEW       PLAN: PT FREQUENCY: 1x/week   PT DURATION: 8 weeks   PLANNED INTERVENTIONS: Therapeutic exercises, Therapeutic activity, Neuromuscular re-education, Balance training, Gait training, Patient/Family education, Joint mobilization, Aquatic Therapy, Electrical stimulation, Cryotherapy, Moist heat, Taping, and Manual therapy.   PLAN FOR NEXT SESSION:  Review HEP, balance challenges, squat progression    Raeford Razor, PT 09/12/21 3:06 PM Phone: [phone] Fax: [phone]

## 2021-09-12 NOTE — Progress Notes (Unsigned)
GASTROENTEROLOGY PROCEDURE H&P NOTE   Primary Care Physician: Vernell Morgans, NP    Reason for Procedure:   Constipation, change in bowel habits  Plan:    Colonoscopy  Patient is appropriate for endoscopic procedure(s) in the ambulatory ([facility]) setting.  The nature of the procedure, as well as the risks, benefits, and alternatives were carefully and thoroughly reviewed with the patient. Ample time for discussion and questions allowed. The patient understood, was satisfied, and agreed to proceed.     HPI: Shelby Dyer is a 26 y.o. female who presents for Colonoscopy for evaluation of constipation and change in bowel habits .  Patient was most recently seen in the Gastroenterology Clinic on 08/23/2021 by me.  No interval change in medical history since that appointment. Please refer to that note for full details regarding GI history and clinical presentation.   Past Medical History:  Diagnosis Date   Anxiety     Past Surgical History:  Procedure Laterality Date   double scope      Tub place in both ear  Bilateral    When she was 26 years of age   WISDOM TOOTH EXTRACTION  2006   All 4    Prior to Admission medications   Medication Sig Start Date End Date Taking? Authorizing Provider  cetirizine (ZYRTEC) 10 MG tablet Take 20 mg by mouth daily.   Yes [provider]  famotidine (PEPCID) 40 MG tablet Take 40 mg by mouth 2 (two) times daily.   Yes [provider]  fexofenadine (ALLEGRA) 180 MG tablet Take 180 mg by mouth as needed for allergies or rhinitis.   Yes [provider]  hydrOXYzine (ATARAX) 25 MG tablet Take 25-50 mg by mouth every 8 (eight) hours as needed (allergies).   Yes [provider]  linaclotide Karlene Einstein) 290 MCG CAPS capsule Take 1 capsule (290 mcg total) by mouth daily before breakfast. 08/23/21  Yes Rochelle Larue V, DO  metoprolol tartrate (LOPRESSOR) 25 MG tablet Take 25 mg by mouth 2 (two) times daily.   Yes  [provider]  Multiple Vitamin (MULTIVITAMIN) tablet Take 1 tablet by mouth daily.   Yes [provider]  nortriptyline (PAMELOR) 25 MG capsule Take 75 mg by mouth daily after supper.   Yes [provider]  ondansetron (ZOFRAN) 4 MG tablet Take 1 tablet (4 mg total) by mouth every 6 (six) hours as needed for nausea or vomiting. 07/17/21  Yes Yanel Dombrosky V, DO  Riboflavin 100 MG TABS Take 200 mg by mouth daily in the afternoon.   Yes [provider]  ALPRAZolam (XANAX XR) 0.5 MG 24 hr tablet Take 0.5 mg by mouth as needed for anxiety.    [provider]  EPINEPHrine 0.3 mg/0.3 mL IJ SOAJ injection Inject 0.3 mg into the muscle as needed for anaphylaxis.    [provider]  levonorgestrel (MIRENA) 20 MCG/24HR IUD 1 each by Intrauterine route once.    [provider]    Current Outpatient Medications  Medication Sig Dispense Refill   cetirizine (ZYRTEC) 10 MG tablet Take 20 mg by mouth daily.     famotidine (PEPCID) 40 MG tablet Take 40 mg by mouth 2 (two) times daily.     fexofenadine (ALLEGRA) 180 MG tablet Take 180 mg by mouth as needed for allergies or rhinitis.     hydrOXYzine (ATARAX) 25 MG tablet Take 25-50 mg by mouth every 8 (eight) hours as needed (allergies).  linaclotide (LINZESS) 290 MCG CAPS capsule Take 1 capsule (290 mcg total) by mouth daily before breakfast. 90 capsule 4   metoprolol tartrate (LOPRESSOR) 25 MG tablet Take 25 mg by mouth 2 (two) times daily.     Multiple Vitamin (MULTIVITAMIN) tablet Take 1 tablet by mouth daily.     nortriptyline (PAMELOR) 25 MG capsule Take 75 mg by mouth daily after supper.     ondansetron (ZOFRAN) 4 MG tablet Take 1 tablet (4 mg total) by mouth every 6 (six) hours as needed for nausea or vomiting. 12 tablet 0   Riboflavin 100 MG TABS Take 200 mg by mouth daily in the afternoon.     ALPRAZolam (XANAX XR) 0.5 MG 24 hr tablet Take 0.5 mg by mouth as needed for anxiety.      EPINEPHrine 0.3 mg/0.3 mL IJ SOAJ injection Inject 0.3 mg into the muscle as needed for anaphylaxis.     levonorgestrel (MIRENA) 20 MCG/24HR IUD 1 each by Intrauterine route once.     Current Facility-Administered Medications  Medication Dose Route Frequency Provider Last Rate Last Admin   0.9 %  sodium chloride infusion  500 mL Intravenous Continuous Tee Richeson V, DO        Allergies as of 09/12/2021 - Review Complete 09/12/2021  Allergen Reaction Noted   Latex Anaphylaxis and Hives 06/16/2019    Family History  Problem Relation Age of Onset   Atrial fibrillation Father    Other Sister    Hypertension Brother    Breast cancer Paternal Grandmother    Colon cancer Neg Hx    Stomach cancer Neg Hx    Liver cancer Neg Hx    Pancreatic cancer Neg Hx     Social History   Socioeconomic History   Marital status: Married    Spouse name: Paediatric nurse   Number of children: Not on file   Years of education: Not on file   Highest education level: Not on file  Occupational History   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  Tobacco Use   Smoking status: Former    Types: Cigarettes   Smokeless tobacco: Never  Building services engineer Use: Never used  Substance and Sexual Activity   Alcohol use: No   Drug use: Never   Sexual activity: Not on file    Comment: mirena  Other Topics Concern   Not on file  Social History Narrative   Not on file   Social Determinants of Health   Financial Resource Strain: Not on file  Food Insecurity: Not on file  Transportation Needs: Not on file  Physical Activity: Not on file  Stress: Not on file  Social Connections: Not on file  Intimate Partner Violence: Not on file    Physical Exam: Vital signs in last 24 hours: @BP  114/65   Pulse 89   Temp (!) 97.3 F (36.3 C) (Temporal)   Ht 5\' 9"  (1.753 m)   Wt 233 lb (105.7 kg)   SpO2 100%   BMI 34.41 kg/m  GEN: NAD EYE: Sclerae anicteric ENT: MMM CV:  Non-tachycardic Pulm: CTA b/l GI: Soft, NT/ND NEURO:  Alert & Oriented x 3   , DO [facility]   09/12/2021 11:50 AM

## 2021-09-12 NOTE — Op Note (Signed)
[facility] Patient Name: Shelby Dyer Procedure Date: 09/12/2021 11:48 AM MRN: [id number] Endoscopist: Doristine Locks , MD Age: 26 Referring MD:  Date of Birth: 1996/03/07 Gender: Female Account #: [id number] Procedure:                Colonoscopy Indications:              Change in bowel habits, Constipation Medicines:                Monitored Anesthesia Care Procedure:                Pre-Anesthesia Assessment:                           - Prior to the procedure, a History and Physical                            was performed, and patient medications and                            allergies were reviewed. The patient's tolerance of                            previous anesthesia was also reviewed. The risks                            and benefits of the procedure and the sedation                            options and risks were discussed with the patient.                            All questions were answered, and informed consent                            was obtained. Prior Anticoagulants: The patient has                            taken no previous anticoagulant or antiplatelet                            agents. ASA Grade Assessment: II - A patient with                            mild systemic disease. After reviewing the risks                            and benefits, the patient was deemed in                            satisfactory condition to undergo the procedure.                           After obtaining informed consent, the colonoscope  was passed under direct vision. Throughout the                            procedure, the patient's blood pressure, pulse, and                            oxygen saturations were monitored continuously. The                            CF [id number] #[id number] was introduced through the anus                            and advanced to the the cecum, identified by                            appendiceal orifice and  ileocecal valve. The                            colonoscopy was performed without difficulty. The                            patient tolerated the procedure well. The quality                            of the bowel preparation was good. The ileocecal                            valve, appendiceal orifice, and rectum were                            photographed. Scope In: 11:56:26 AM Scope Out: 12:11:09 PM Scope Withdrawal Time: 0 hours 7 minutes 50 seconds  Total Procedure Duration: 0 hours 14 minutes 43 seconds  Findings:                 The perianal and digital rectal examinations were                            normal.                           The entire colon appeared normal. No areas of                            mucosal erythema, edema, erosions, or ulceration                            and no areas of luminal narrowing or stricture.                           The retroflexed view of the distal rectum and anal                            verge was normal and showed no anal or rectal  abnormalities. Complications:            No immediate complications. Estimated Blood Loss:     Estimated blood loss: none. Impression:               - The entire examined colon is normal.                           - The distal rectum and anal verge are normal on                            retroflexion view.                           - No specimens collected. Recommendation:           - Patient has a contact number available for                            emergencies. The signs and symptoms of potential                            delayed complications were discussed with the                            patient. Return to normal activities tomorrow.                            Written discharge instructions were provided to the                            patient.                           - Resume previous diet.                           - Continue present medications.                            - Repeat colonoscopy at age 37 for screening                            purposes.                           - Return to GI office at appointment to be                            scheduled.                           - Continue Linzess 290 mcg/day for now and restart                            Miralax 1 cap/day and Dulcolax prn. If still no  improvement, will trial Mote[employer]ity. Doristine Locks, MD 09/12/2021 12:16:47 PM

## 2021-09-12 NOTE — Progress Notes (Unsigned)
Pt's states no medical or surgical changes since previsit or office visit. 

## 2021-09-13 ENCOUNTER — Telehealth: Payer: Self-pay

## 2021-09-13 ENCOUNTER — Ambulatory Visit: Payer: 59 | Admitting: Physical Therapy

## 2021-09-13 DIAGNOSIS — R2689 Other abnormalities of gait and mobility: Secondary | ICD-10-CM

## 2021-09-13 DIAGNOSIS — M2550 Pain in unspecified joint: Secondary | ICD-10-CM

## 2021-09-13 DIAGNOSIS — M357 Hypermobility syndrome: Secondary | ICD-10-CM

## 2021-09-13 NOTE — Telephone Encounter (Signed)
  Follow up Call-     09/12/2021   10:59 AM 07/28/2021    1:10 PM  Call back number  Post procedure Call Back phone  # [phone]  Permission to leave phone message Yes Yes     Patient questions:  Do you have a fever, pain , or abdominal swelling? No. Pain Score  0 *  Have you tolerated food without any problems? Yes.    Have you been able to return to your normal activities? Yes.    Do you have any questions about your discharge instructions: Diet   No. Medications  No. Follow up visit  No.  Do you have questions or concerns about your Care? No.  Actions: * If pain score is 4 or above: No action needed, pain <4.

## 2021-09-18 ENCOUNTER — Telehealth: Payer: Self-pay | Admitting: Physical Therapy

## 2021-09-18 ENCOUNTER — Telehealth: Payer: Self-pay | Admitting: Gastroenterology

## 2021-09-18 ENCOUNTER — Ambulatory Visit: Payer: 59 | Admitting: Physical Therapy

## 2021-09-20 ENCOUNTER — Other Ambulatory Visit: Payer: Self-pay

## 2021-09-20 MED ORDER — DICYCLOMINE HCL 10 MG PO CAPS: 10.0000 mg | ORAL_CAPSULE | Freq: Four times a day (QID) | ORAL | 0 refills | Status: DC | PRN

## 2021-09-20 NOTE — Telephone Encounter (Signed)
Yes, sometimes bowel habits can take a little time to come back to baseline after bowel preparation.  Please send in Rx for Bentyl 10 mg p.o. as needed every 6 hours To take as needed for abdominal pain, cramping, spasm.  Continue adequate hydration with at least 64 ounces of water/day.

## 2021-09-20 NOTE — Telephone Encounter (Signed)
Spoke with pt and gave pt recommendations. Pt verbalized understanding. Prescription sent to pharmacy.

## 2021-09-25 ENCOUNTER — Encounter: Payer: Self-pay | Admitting: Physical Therapy

## 2021-09-25 ENCOUNTER — Ambulatory Visit: Payer: 59 | Attending: Family Medicine | Admitting: Physical Therapy

## 2021-09-25 DIAGNOSIS — M2550 Pain in unspecified joint: Secondary | ICD-10-CM | POA: Diagnosis present

## 2021-09-25 DIAGNOSIS — M357 Hypermobility syndrome: Secondary | ICD-10-CM | POA: Insufficient documentation

## 2021-09-25 DIAGNOSIS — R2689 Other abnormalities of gait and mobility: Secondary | ICD-10-CM | POA: Diagnosis present

## 2021-09-25 NOTE — Therapy (Signed)
OUTPATIENT PHYSICAL THERAPY TREATMENT NOTE/    Patient Name: Shelby Dyer MRN: [id number] DOB:04-30-1995, 26 y.o., female Today's Date: 09/25/2021  PCP: Legrand Como, NP REFERRING PROVIDER: Legrand Como, NP   PT End of Session - 09/25/21 1152     Visit Number 11    Number of Visits 16    Date for PT Re-Evaluation 10/31/21    Authorization Type BCBS    PT Start Time 1150    PT Stop Time 1230    PT Time Calculation (min) 40 min                  Past Medical History:  Diagnosis Date   Anxiety    Past Surgical History:  Procedure Laterality Date   double scope      Tub place in both ear  Bilateral    When she was 26 years of age   27 TOOTH EXTRACTION  2006   All 4   Patient Active Problem List   Diagnosis Date Noted   Hip pain 06/13/2021   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   Dysautonomia orthostatic hypotension syndrome 05/17/2021   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  Give seated rest breaks for standing, blood pools in LE   SUBJECTIVE:  I realized I missed my appointment after the fact. I had a lot of neck pain this morning. One hand was cramped up last night and woke me up. I am good now.   PAIN:  Are you having pain? Yes:  NPRS scale: 2-3/10 rated today.  Pain location: neck 2-3/10 and L knees 2/10 Pain description: achy Aggravating factors: activity. Back pain (standing  long time) and carrying item in R hand Relieving factors: meds, heating pad, rest    OBJECTIVE:    DIAGNOSTIC FINDINGS:  CT last yet   PATIENT SURVEYS:  [place] Impact Scale  given on eval    COGNITION:            Overall cognitive status: Within functional limits for tasks assessed                          SENSATION: Reports hands, feet in the past but improved with med change  Amrs and hands sleeping AM frequently   MUSCLE LENGTH: Hamstrings: tight   POSTURE:  Forward head, rounded shoulders   PALPATION: NT   LUMBAR ROM:    Active  A/PROM  06/12/2021  Flexion Palms to floor  Extension Hyper  Right lateral flexion    Left lateral flexion    Right rotation Hyper  Left rotation Hyper    (Blank rows = not tested)    CERVICAL ROM: 07/20/21 Extension 75 deg Flexion 40 capital flexion , 60 cervical flexion  Rt sidebending 55 Lt sidebending 49 Rt rot 75  Lt. rot 82 Pain minimal with AROM of C-spine, senses clicking with extension  LE ROM:   Active  Right 06/12/2021 Left 06/12/2021  Hip flexion  Hip extension      Hip abduction      Hip adduction      Hip internal rotation Rel tight Rel tight   Hip external rotation WNL WNL  Knee flexion      Knee extension      Ankle dorsiflexion      Ankle plantarflexion      Ankle inversion      Ankle eversion       (Blank rows = not tested)   LE MMT:   MMT Right 06/12/2021 Left 06/12/2021 Rt  08/10/21 Lt.  08/10/21  Hip flexion 4+ 4+ 5 5  Hip extension 4- 4-    Hip abduction 4 4    Hip adduction        Hip internal rotation        Hip external rotation        Knee flexion 4+ _0 Knee extension 4+ 4+ 5 5  Ankle dorsiflexion 4+ 4+    Ankle plantarflexion        Ankle inversion        Ankle eversion         (Blank rows = not tested)   FUNCTIONAL TESTS:  2 minute walk test: 412 feet , HR 120 max Sa02 99%, 09/25/21: 461 feet  14.3 5 x STS ( 07/07/21) Tandem stance 6 sec RLE back (07/07/21) Tandem stance  8 sec LLE back  (07/07/21)  SLS 4-5 sec best bilat  DGI (21/24) -taken 07/07/21     GAIT: Distance walked: 150 Assistive device utilized: None Level of assistance: Complete Independence Comments: none    Today's Treatment  [facility] Adult PT Treatment:                                                DATE: 09/25/21 Therapeutic Exercise: 2 MWT 461 feet Squat tap to chair 10 x 2 15 LB STS with single leg back x 10 each (mat table ), no UE SLS 60 sec each achieved Narrow on foam with trunk rotations Tandem on foam with head turns Rocker board lateral weight shifting x 20 Hurdles forward single stepping, alternating- CGA- no difficulty Hurdles side stepping each way- CGA- no difficulty    [facility] Adult PT Treatment:                                                DATE: 09/13/21 Therapeutic Exercise: NuStep L6 for 6 min  Leg press 2 plates x 15 toes parallel and then toes out slightly  Leg extension 25 lbs x 15, 20 lbs x 15 Lat pull down 25 lbs x 15  Row 35 lbs 2 x 15  Freemotion : Diagonal 2 plates x 15 each UE , bilat. Shoulder press x  15 Single arm ER 1 plate x 10 each UE  LTR head turns x 10 Supine core/head press used ring  Overhead lift with Pilates ring  added SLR  x 8  Opp UE/LE press with ring   [facility] Adult PT Treatment:  DATE: 09/05/21 Therapeutic Exercise: Sit to stand x 10 with 15 lbs Squats x 10 x 15 lbs x 2  Hip hinge x 10 x 15 lbs x 2  Single leg RDL x 15 used wall for support, no UE for about 5 reps each side  Single leg RDL with 5 lbs and wall x 10  Single leg 5 lbs infinity motion with 30 sec  Overhead shoulder flexion hold 5 lb x 30 sec with high knees march  Foam roller alternating arms x 10 , then bilateral horizontal abduction x 10  Foam roller core: clam (double leg) then unilateral  x 10 each  Dead bug foam roller x 10   [facility] Adult PT Treatment:                                                DATE: 08/31/21 Therapeutic  Exercise: STS 15# 10 x 3  SLS 28 left, 35 right  Blue rocker A/P bilat Ue to 0 UE- difficult Blue Rocker Lateral weight shifting bilat UE to 0 UE -difficult  Tandem with head turns Tandem with EC SLS on foam oval , 3 sec best R and L    [facility] Adult PT Treatment:                                                DATE: 08/10/21 Therapeutic Exercise: Sit to stand x 15 with 10 lbs  HR 140's  Heel raises 10 lbs x 15  Chest press 10 lbs x 10 with KB Sit to stand with chest press combo 10 lbs (Sa02 86 post ex, quickly went o up to 98%) Hip hinge dead lift 10 lbs x 15  See previous note  for [employer] Test  55/56       PATIENT EDUCATION:  Education details: HEP Person educated: Patient Education method: Explanation Education comprehension: verbalized understanding     HOME EXERCISE PROGRAM: Access Code: [email or web address] URL: [email or web address] Date: 07/07/2021 Prepared by: Hessie Diener  Exercises - Supine Bridge with Spinal Articulation  - 1 x daily - 7 x weekly - 2 sets - 10 reps - Bent Knee Fallouts with Alternating Legs  - 1 x daily - 7 x weekly - 2 sets - 10 reps - Supine March  - 1 x daily - 7 x weekly - 2 sets - 10 reps - Standing Tandem Balance with Counter Support  - 1 x daily - 7 x weekly - 1 sets - 3 reps - 30 hold - Standing Single Leg Stance with Counter Support  - 1 x daily - 7 x weekly - 1 sets - 3 reps - 30 hold - shoulder added horizontal abd and ER green or blue  ASSESSMENT:   CLINICAL IMPRESSION:  She has met her static balance goal (LTG# 5 met) Progressed to single leg STS and Worked on dynamic balance from level and unlevel surface, all  with good tolerance.  2 MWT improved to 461 feet with a goal of 600 feet. Overall she reports feeling minimal improvement in her gait on unlevel surfaces. She does note some improvement in activity tolerance due to a recent move.    OBJECTIVE IMPAIRMENTS cardiopulmonary status limiting activity, decreased activity  tolerance, decreased balance, decreased coordination, decreased endurance, decreased mobility, difficulty walking, decreased ROM, decreased strength, dizziness, obesity, and pain.    ACTIVITY LIMITATIONS cleaning, community activity, meal prep, occupation, laundry, yard work, and shopping.    PERSONAL FACTORS Past/current experiences, Time since onset of injury/illness/exacerbation, and 3+ comorbidities: chronic joint pain, hypersomnelence, migraines, POTS, MCAS  are also affecting patient's functional outcome.      REHAB POTENTIAL: Excellent   CLINICAL DECISION MAKING: Stable/uncomplicated   EVALUATION COMPLEXITY: Low     GOALS: Goals reviewed with patient? No   SHORT TERM GOALS: Target date: 07/03/2021   Pt will be I with HEP for core, pelvic stability  Baseline: unknown Goal status: MET   2.  Pt will complete Survey for hypermobility [facility] Impact Scale) and set goal.  Baseline: given on eval  Goal status: ONGOING   3.  Pt will report increased tolerance for work, home activities, reporting less fatigue overall with 1-2 tasks  Baseline: low energy , hard to do > 1 simple home task  Status: 09/25/21: was able to move recently  Goal status: ONGOING due to other GI issues , HR variability    4  Pt will be screened for balance (DGI) and goal set 24/24.  Baseline: 21/24  Goal status: MET    LONG TERM GOALS: Target date: 08/07/2021   Pt will be able to walk 600 feet or more on the 2 min walk test to demo improved exercise tolerance  Baseline: 434 feet  Status: 461 feet 09/25/21 Goal status: ongoing   2.  Pt will understand and demo proper posture and body mechanics as it pertains to lifting and squatting for work, home tasks  Baseline: needs reinforcement Goal status: ongoing    3.  Pt will be able to walk on uneven ground with improved confidence in balance  Baseline: loses balance  Status: 09/25/21: min improvement to date  Goal status: ongoing , balance screens done     4.  Pt will demo 5/5 strength in UE and LE for maximal joint support  Baseline:4+/5 to 5/5 in LEs  Goal status: INITIAL   5.  Pt will be able to stand on tandem , SLS for light balance work for 30 sec at a time , stable surface Baseline: unable > 10 sec  Status 08/10/21: held tandem 30 sec Status: 09/25/21: L 60 sec , R 60 sec  Goal status: MET      6. Patient will recover from PT session back to baseline level day after PT session. Baseline: Needs 1-2 days  Status: NEW       PLAN: PT FREQUENCY: 1x/week   PT DURATION: 8 weeks   PLANNED INTERVENTIONS: Therapeutic exercises, Therapeutic activity, Neuromuscular re-education, Balance training, Gait training, Patient/Family education, Joint mobilization, Aquatic Therapy, Electrical stimulation, Cryotherapy, Moist heat, Taping, and Manual therapy.   PLAN FOR NEXT SESSION:  Progress HEP, balance challenges, squat progression. Consider using large mat for gait on unlevel surface    Hessie Diener, PTA 09/25/21 1:10 PM Phone: [phone] Fax: [phone]

## 2021-09-27 ENCOUNTER — Telehealth: Payer: Self-pay | Admitting: Gastroenterology

## 2021-09-27 ENCOUNTER — Other Ambulatory Visit: Payer: Self-pay

## 2021-09-27 MED ORDER — MOTEGRITY 2 MG PO TABS: 2.0000 mg | ORAL_TABLET | Freq: Every day | ORAL | 3 refills | Status: DC

## 2021-09-27 NOTE — Telephone Encounter (Signed)
Patient called back to follow up on message below. Said she would like to know what she can do by today.

## 2021-09-27 NOTE — Telephone Encounter (Signed)
Gave pt recommendations. Motegrity sent to pt's pharmacy. Pt verbalized understanding and had no other concerns at end of call.

## 2021-09-27 NOTE — Telephone Encounter (Signed)
Is she using the Linzess 290 mcg again?  If so, has she also added MiraLAX and Dulcolax back to her regimen?  If she is on all these and still with constipation, time to change to Motegrity.  Start Motegrity 2 mg daily, #60, RF 3.  Do caution patient that loose stools, abdominal pain, abdominal spasm, nausea, and other GI sxs are common in the first 7-14 days after initiation of therapy.  If not having regular stooling after 7 days, can add MiraLAX and/or Dulcolax back into the regimen again.  In the meantime, can increase Bentyl to 20 mg as needed every 6 hours.  If still not efficacious, will change to Levsin.

## 2021-09-27 NOTE — Telephone Encounter (Signed)
Pt states she has had one bowel movement since colonoscopy on 6/20. Last bm was 6/29.  Pt's severe abd pain returned last night and now bentyl isn't helping. Bentyl helped initially but abd pain is now severe. Let pt know if she is having severe pain she can go to the ED and I will send message to provider as well.

## 2021-09-28 ENCOUNTER — Telehealth: Payer: Self-pay | Admitting: Pharmacy Technician

## 2021-09-28 ENCOUNTER — Other Ambulatory Visit (HOSPITAL_COMMUNITY): Payer: Self-pay

## 2021-09-28 NOTE — Telephone Encounter (Signed)
Patient Advocate Encounter  Received notification from [employer] that prior authorization for MOTEGRITY 2 MG is required.   PA submitted on 09/28/2021 Key [id number] Status is pending    Ricke Hey, CPhT Patient Advocate Phone: [phone]

## 2021-09-29 ENCOUNTER — Other Ambulatory Visit (HOSPITAL_COMMUNITY): Payer: Self-pay

## 2021-10-02 NOTE — Therapy (Signed)
OUTPATIENT PHYSICAL THERAPY TREATMENT NOTE/    Patient Name: Shelby Dyer MRN: [id number] DOB:1995-11-05, 26 y.o., female Today's Date: 10/03/2021  PCP: Vernell Morgans, NP REFERRING PROVIDER: Vernell Morgans, NP   PT End of Session - 10/03/21 1200     Visit Number 12    Date for PT Re-Evaluation 10/31/21    Authorization Type BCBS    PT Start Time 1200    PT Stop Time 1235    PT Time Calculation (min) 35 min    Activity Tolerance Patient tolerated treatment well    Behavior During Therapy WFL for tasks assessed/performed                   Past Medical History:  Diagnosis Date   Anxiety    Past Surgical History:  Procedure Laterality Date   double scope      Tub place in both ear  Bilateral    When she was 26 years of age   WISDOM TOOTH EXTRACTION  2006   All 4   Patient Active Problem List   Diagnosis Date Noted   Hip pain 06/13/2021   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   Dysautonomia orthostatic hypotension syndrome 05/17/2021   Mast cell activation ([facility]) 05/17/2021   Intractable periodic headache syndrome 05/17/2021   IBS (irritable bowel syndrome) 04/03/2021   Median nerve neuritis, right 06/25/2019   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   Idiopathic hypersomnolence 06/10/2014    PCP: Eulogio Bear, MD   REFERRING PROVIDER: Myra Rude, MD  THERAPY DIAG:  Hypermobility syndrome  Other abnormalities of gait and mobility  Polyarthralgia  PERTINENT HISTORY: Migraines, hypermobility  MCAS, tachycardia, IBS Her mother has EDS and her sister has POTS  PRECAUTIONS: Other: POTS , positional changes.  Give seated rest breaks for standing, blood pools in LE   SUBJECTIVE:  I have Mast Cell activation.  Was doing really well with the new med.  Motegrity.  But then it came back. I am on a bunch of new meds. I was bit by a cat this AM.    PAIN:  Are you having pain? Yes:  NPRS scale: 2-3/10 rated today.  Pain location:  neck 2-3/10 and L hip anterior  Pain description: achy Aggravating factors: activity. Back pain (standing long time) and carrying item in R hand Relieving factors: meds, heating pad, rest    OBJECTIVE:    DIAGNOSTIC FINDINGS:  CT last yet   PATIENT SURVEYS:  [place] Impact Scale  given on eval    COGNITION:            Overall cognitive status: Within functional limits for tasks assessed                          SENSATION: Reports hands, feet in the past but improved with med change  Amrs and hands sleeping AM frequently   MUSCLE LENGTH: Hamstrings: tight   POSTURE:  Forward head, rounded shoulders   PALPATION: NT   LUMBAR ROM:    Active  A/PROM  06/12/2021  Flexion Palms to floor  Extension Hyper  Right lateral flexion    Left lateral flexion    Right rotation Hyper  Left rotation Hyper    (Blank rows = not tested)    CERVICAL ROM: 07/20/21 Extension 75 deg Flexion 40 capital flexion , 60 cervical flexion  Rt sidebending 55 Lt sidebending 49 Rt rot 75  Lt. rot 82 Pain minimal with AROM  of C-spine, senses clicking with extension  LE ROM:   Active  Right 06/12/2021 Left 06/12/2021  Hip flexion      Hip extension      Hip abduction      Hip adduction      Hip internal rotation Rel tight Rel tight   Hip external rotation WNL WNL  Knee flexion      Knee extension      Ankle dorsiflexion      Ankle plantarflexion      Ankle inversion      Ankle eversion       (Blank rows = not tested)   LE MMT:   MMT Right 06/12/2021 Left 06/12/2021 Rt  08/10/21 Lt.  08/10/21  Hip flexion 4+ 4+ 5 5  Hip extension 4- 4-    Hip abduction 4 4    Hip adduction        Hip internal rotation        Hip external rotation        Knee flexion 4+ 4 5 5   Knee extension 4+ 4+ 5 5  Ankle dorsiflexion 4+ 4+    Ankle plantarflexion        Ankle inversion        Ankle eversion         (Blank rows = not tested)   FUNCTIONAL TESTS:  2 minute walk test: 412 feet , HR 120 max  Sa02 99%, 09/25/21: 461 feet  14.3 5 x STS ( 07/07/21) Tandem stance 6 sec RLE back (07/07/21) Tandem stance 8 sec LLE back  (07/07/21)  SLS 4-5 sec best bilat  DGI (21/24) -taken 07/07/21     GAIT: Distance walked: 150 Assistive device utilized: None Level of assistance: Complete Independence Comments: none    Today's Treatment   [facility] Adult PT Treatment:                                                DATE: 10/03/21 Therapeutic Exercise: Pilates Reformer used for LE/core strength, postural strength, lumbopelvic disassociation and core control.  Exercises included:  Reformer Footwork 2 red 1 blue 1 yellow: double leg heels in parallel and ER, narrow and wide  Single leg 2 red 1 blue narrow heels with top leg in table top  SLR x 10  GTB alternating clam x 15 Partial bridge x 10 with 3 springs added all springs for x 10 Partial bridge with alt. Clam x 10    Supine arms 1 red 1 yellow  Arcs and circles x 10 each with directional change  Quadruped 1 red LE to plank x 10  Seated arms 1 red "serving a tray, hug a tree " x 10 each     [facility] Adult PT Treatment:                                                DATE: 09/25/21 Therapeutic Exercise: 2 MWT 461 feet Squat tap to chair 10 x 2 15 LB STS with single leg back x 10 each (mat table ), no UE SLS 60 sec each achieved Narrow on foam with trunk rotations Tandem on foam with head turns Rocker board lateral weight shifting x 20 Hurdles forward  single stepping, alternating- CGA- no difficulty Hurdles side stepping each way- CGA- no difficulty    [facility] Adult PT Treatment:                                                DATE: 09/13/21 Therapeutic Exercise: NuStep L6 for 6 min  Leg press 2 plates x 15 toes parallel and then toes out slightly  Leg extension 25 lbs x 15, 20 lbs x 15 Lat pull down 25 lbs x 15  Row 35 lbs 2 x 15  Freemotion : Diagonal 2 plates x 15 each UE , bilat. Shoulder press x  15 Single arm ER 1 plate x 10 each UE  LTR  head turns x 10 Supine core/head press used ring  Overhead lift with Pilates ring  added SLR  x 8  Opp UE/LE press with ring   [facility] Adult PT Treatment:                                                DATE: 09/05/21 Therapeutic Exercise: Sit to stand x 10 with 15 lbs Squats x 10 x 15 lbs x 2  Hip hinge x 10 x 15 lbs x 2  Single leg RDL x 15 used wall for support, no UE for about 5 reps each side  Single leg RDL with 5 lbs and wall x 10  Single leg 5 lbs infinity motion with 30 sec  Overhead shoulder flexion hold 5 lb x 30 sec with high knees march  Foam roller alternating arms x 10 , then bilateral horizontal abduction x 10  Foam roller core: clam (double leg) then unilateral  x 10 each  Dead bug foam roller x 10         PATIENT EDUCATION:  Education details: HEP Person educated: Patient Education method: Explanation Education comprehension: verbalized understanding     HOME EXERCISE PROGRAM: Access Code: [email or web address] URL: [email or web address] Date: 07/07/2021 Prepared by: Jannette Spanner  Exercises - Supine Bridge with Spinal Articulation  - 1 x daily - 7 x weekly - 2 sets - 10 reps - Bent Knee Fallouts with Alternating Legs  - 1 x daily - 7 x weekly - 2 sets - 10 reps - Supine March  - 1 x daily - 7 x weekly - 2 sets - 10 reps - Standing Tandem Balance with Counter Support  - 1 x daily - 7 x weekly - 1 sets - 3 reps - 30 hold - Standing Single Leg Stance with Counter Support  - 1 x daily - 7 x weekly - 1 sets - 3 reps - 30 hold - shoulder added horizontal abd and ER green or blue    ASSESSMENT:  CLINICAL IMPRESSION: Pt with increased shoulder pain after session. Cues given for reducing shoulder ROM with chest fly but this may be the culprit. Arrived late due to work issue.  Able to use Reformer to maximize feedback to joints and work multiple areas.  Cont POC.     OBJECTIVE IMPAIRMENTS cardiopulmonary status limiting activity, decreased activity tolerance,  decreased balance, decreased coordination, decreased endurance, decreased mobility, difficulty walking, decreased ROM, decreased strength, dizziness, obesity, and pain.    ACTIVITY LIMITATIONS cleaning, community activity,  meal prep, occupation, laundry, yard work, and shopping.    PERSONAL FACTORS Past/current experiences, Time since onset of injury/illness/exacerbation, and 3+ comorbidities: chronic joint pain, hypersomnelence, migraines, POTS, MCAS  are also affecting patient's functional outcome.      REHAB POTENTIAL: Excellent   CLINICAL DECISION MAKING: Stable/uncomplicated   EVALUATION COMPLEXITY: Low     GOALS: Goals reviewed with patient? No   SHORT TERM GOALS: Target date: 07/03/2021   Pt will be I with HEP for core, pelvic stability  Baseline: unknown Goal status: MET   2.  Pt will complete Survey for hypermobility [facility] Impact Scale) and set goal.  Baseline: given on eval  Goal status: ONGOING   3.  Pt will report increased tolerance for work, home activities, reporting less fatigue overall with 1-2 tasks  Baseline: low energy , hard to do > 1 simple home task  Status: 09/25/21: was able to move recently  Goal status: ONGOING due to other GI issues , HR variability    4  Pt will be screened for balance (DGI) and goal set 24/24.  Baseline: 21/24  Goal status: MET    LONG TERM GOALS: Target date: 08/07/2021   Pt will be able to walk 600 feet or more on the 2 min walk test to demo improved exercise tolerance  Baseline: 434 feet  Status: 461 feet 09/25/21 Goal status: ongoing   2.  Pt will understand and demo proper posture and body mechanics as it pertains to lifting and squatting for work, home tasks  Baseline: needs reinforcement Goal status: ongoing    3.  Pt will be able to walk on uneven ground with improved confidence in balance  Baseline: loses balance  Status: 09/25/21: min improvement to date  Goal status: ongoing , balance screens done    4.  Pt will  demo 5/5 strength in UE and LE for maximal joint support  Baseline:4+/5 to 5/5 in LEs  Goal status: INITIAL   5.  Pt will be able to stand on tandem , SLS for light balance work for 30 sec at a time , stable surface Baseline: unable > 10 sec  Status 08/10/21: held tandem 30 sec Status: 09/25/21: L 60 sec , R 60 sec  Goal status: MET      6. Patient will recover from PT session back to baseline level day after PT session. Baseline: Needs 1-2 days  Status: NEW       PLAN: PT FREQUENCY: 1x/week   PT DURATION: 8 weeks   PLANNED INTERVENTIONS: Therapeutic exercises, Therapeutic activity, Neuromuscular re-education, Balance training, Gait training, Patient/Family education, Joint mobilization, Aquatic Therapy, Electrical stimulation, Cryotherapy, Moist heat, Taping, and Manual therapy.   PLAN FOR NEXT SESSION:  Progress HEP, balance challenges, squat progression. Consider using large mat for gait on unlevel surface. Upgrade HEP ??    Karie Mainland, PT 10/03/21 1:01 PM Phone: [phone] Fax: [phone]

## 2021-10-03 ENCOUNTER — Ambulatory Visit: Payer: 59 | Admitting: Physical Therapy

## 2021-10-03 ENCOUNTER — Encounter: Payer: Self-pay | Admitting: Physical Therapy

## 2021-10-03 DIAGNOSIS — R2689 Other abnormalities of gait and mobility: Secondary | ICD-10-CM

## 2021-10-03 DIAGNOSIS — M357 Hypermobility syndrome: Secondary | ICD-10-CM

## 2021-10-03 DIAGNOSIS — M2550 Pain in unspecified joint: Secondary | ICD-10-CM

## 2021-10-09 ENCOUNTER — Encounter: Payer: Self-pay | Admitting: Gastroenterology

## 2021-10-09 NOTE — Telephone Encounter (Signed)
Received a fax regarding Prior Authorization from [employer] for Motegrity 2mg . Authorization has been DENIED because this plan only covers CIC diagnosis.  , CPhT Pharmacy Patient Advocate Specialist [facility] Patient Advocate Team Direct Number: [phone]   Fax: [phone]

## 2021-10-10 DIAGNOSIS — K5904 Chronic idiopathic constipation: Secondary | ICD-10-CM | POA: Insufficient documentation

## 2021-10-10 MED ORDER — MOTEGRITY 2 MG PO TABS: 2.0000 mg | ORAL_TABLET | Freq: Every day | ORAL | 3 refills | Status: DC

## 2021-10-10 NOTE — Telephone Encounter (Signed)
Can we resubmit the Prior Auth? Pt does have chronic idiopathic constipation.

## 2021-10-10 NOTE — Telephone Encounter (Signed)
I agree with restarting the Miralax and Dulcolax to take alongside the Motegrity, and titrate to effect. The goal should be for soft stools without straining to have a bowel movement rather than achieving a certain stool frequency (ie, we are not targeting 1 BM per day if you otherwise feel well with 1 every 3 days). Can we please update her medication indication to reflect that she does have Chronic Idiopathic Constipation and add to her PMHx to assist in approval. Thanks.

## 2021-10-10 NOTE — Addendum Note (Signed)
Addended by: Garen Lah A on: 10/10/2021 08:11 AM   Modules accepted: Orders

## 2021-10-12 ENCOUNTER — Ambulatory Visit: Payer: 59 | Admitting: Physical Therapy

## 2021-10-12 NOTE — Telephone Encounter (Signed)
Patient Advocate Encounter  Received notification from [facility] OFFICE that prior authorization for MOTEGRITY 2MG  is required.   PA submitted on 7.20.23 Key [id number] Status is pending    [id number], CPhT Patient Advocate Phone: [phone]

## 2021-10-12 NOTE — Telephone Encounter (Signed)
Patient Advocate Encounter  Prior Authorization for Motegrity 2MG  has been approved.    PA Case ID: Effective dates: 10/12/2021 through 10/13/2022  10/15/2022, CPhT Pharmacy Patient Advocate Specialist [facility] Patient Advocate Team Phone: [phone]   Fax: [phone]

## 2021-10-12 NOTE — Therapy (Deleted)
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  PERTINENT HISTORY: Migraines, hypermobility  MCAS, tachycardia, IBS Her mother has EDS and her sister has POTS  PRECAUTIONS: Other: POTS , positional changes.  Give seated rest breaks for standing, blood pools in LE   SUBJECTIVE:  I have Mast Cell activation.  Was doing really well with the new med.  Motegrity.  But then it came back. I am on a bunch of new meds. I was bit by a cat this AM.    PAIN:  Are you having pain? Yes:  NPRS scale: 2-3/10 rated today.  Pain location: neck 2-3/10 and L hip anterior  Pain description: achy Aggravating factors: activity. Back pain (standing long time) and carrying item in R hand Relieving factors: meds, heating pad, rest    OBJECTIVE:    DIAGNOSTIC FINDINGS:  CT last yet   PATIENT SURVEYS:  Bristol Impact Scale  given on eval    COGNITION:            Overall cognitive  status: Within functional limits for tasks assessed                          SENSATION: Reports hands, feet in the past but improved with med change  Amrs and hands sleeping AM frequently   MUSCLE LENGTH: Hamstrings: tight   POSTURE:  Forward head, rounded shoulders   PALPATION: NT   LUMBAR ROM:    Active  A/PROM  06/12/2021  Flexion Palms to floor  Extension Hyper  Right lateral flexion    Left lateral flexion    Right rotation Hyper  Left rotation Hyper    (Blank rows = not tested)    CERVICAL ROM: 07/20/21 Extension 75 deg Flexion 40 capital flexion , 60 cervical flexion  Rt sidebending 55 Lt sidebending 49 Rt rot 75  Lt. rot 82 Pain minimal with AROM of C-spine, senses clicking with extension  LE ROM:   Active  Right 06/12/2021 Left 06/12/2021  Hip flexion      Hip extension      Hip abduction      Hip adduction      Hip internal rotation Rel tight Rel tight   Hip external rotation WNL WNL  Knee flexion      Knee extension      Ankle dorsiflexion  Ankle plantarflexion      Ankle inversion      Ankle eversion       (Blank rows = not tested)   LE MMT:   MMT Right 06/12/2021 Left 06/12/2021 Rt  08/10/21 Lt.  08/10/21  Hip flexion 4+ 4+ 5 5  Hip extension 4- 4-    Hip abduction 4 4    Hip adduction        Hip internal rotation        Hip external rotation        Knee flexion 4+ 4 5 5   Knee extension 4+ 4+ 5 5  Ankle dorsiflexion 4+ 4+    Ankle plantarflexion        Ankle inversion        Ankle eversion         (Blank rows = not tested)   FUNCTIONAL TESTS:  2 minute walk test: 412 feet , HR 120 max Sa02 99%, 09/25/21: 461 feet  14.3 5 x STS ( 07/07/21) Tandem stance 6 sec RLE back (07/07/21) Tandem stance 8 sec LLE back  (07/07/21)  SLS 4-5 sec best bilat  DGI (21/24) -taken 07/07/21     GAIT: Distance walked: 150 Assistive device utilized: None Level of assistance: Complete Independence Comments: none    Today's Treatment  [facility] Adult  PT Treatment:                                                DATE: 10/12/21 Therapeutic Exercise: *** Manual Therapy: *** Neuromuscular re-ed: *** Therapeutic Activity: *** Modalities: *** Self Care: [facility] Adult PT Treatment:                                                DATE: 10/03/21 Therapeutic Exercise: Pilates Reformer used for LE/core strength, postural strength, lumbopelvic disassociation and core control.  Exercises included:  Reformer Footwork 2 red 1 blue 1 yellow: double leg heels in parallel and ER, narrow and wide  Single leg 2 red 1 blue narrow heels with top leg in table top  SLR x 10  GTB alternating clam x 15 Partial bridge x 10 with 3 springs added all springs for x 10 Partial bridge with alt. Clam x 10    Supine arms 1 red 1 yellow  Arcs and circles x 10 each with directional change  Quadruped 1 red LE to plank x 10  Seated arms 1 red "serving a tray, hug a tree " x 10 each     [facility] Adult PT Treatment:                                                DATE: 09/25/21 Therapeutic Exercise: 2 MWT 461 feet Squat tap to chair 10 x 2 15 LB STS with single leg back x 10 each (mat table ), no UE SLS 60 sec each achieved Narrow on foam with trunk rotations Tandem on foam with head turns Rocker board lateral weight shifting x 20 Hurdles forward single stepping, alternating- CGA- no difficulty Hurdles side stepping each way- CGA-  no difficulty    [facility] Adult PT Treatment:                                                DATE: 09/13/21 Therapeutic Exercise: NuStep L6 for 6 min  Leg press 2 plates x 15 toes parallel and then toes out slightly  Leg extension 25 lbs x 15, 20 lbs x 15 Lat pull down 25 lbs x 15  Row 35 lbs 2 x 15  Freemotion : Diagonal 2 plates x 15 each UE , bilat. Shoulder press x  15 Single arm ER 1 plate x 10 each UE  LTR head turns x 10 Supine core/head press used ring  Overhead lift with Pilates ring  added SLR  x 8  Opp UE/LE press with  ring   [facility] Adult PT Treatment:                                                DATE: 09/05/21 Therapeutic Exercise: Sit to stand x 10 with 15 lbs Squats x 10 x 15 lbs x 2  Hip hinge x 10 x 15 lbs x 2  Single leg RDL x 15 used wall for support, no UE for about 5 reps each side  Single leg RDL with 5 lbs and wall x 10  Single leg 5 lbs infinity motion with 30 sec  Overhead shoulder flexion hold 5 lb x 30 sec with high knees march  Foam roller alternating arms x 10 , then bilateral horizontal abduction x 10  Foam roller core: clam (double leg) then unilateral  x 10 each  Dead bug foam roller x 10         PATIENT EDUCATION:  Education details: HEP Person educated: Patient Education method: Explanation Education comprehension: verbalized understanding     HOME EXERCISE PROGRAM: Access Code: [email or web address] URL: [email or web address] Date: 07/07/2021 Prepared by: Hessie Diener  Exercises - Supine Bridge with Spinal Articulation  - 1 x daily - 7 x weekly - 2 sets - 10 reps - Bent Knee Fallouts with Alternating Legs  - 1 x daily - 7 x weekly - 2 sets - 10 reps - Supine March  - 1 x daily - 7 x weekly - 2 sets - 10 reps - Standing Tandem Balance with Counter Support  - 1 x daily - 7 x weekly - 1 sets - 3 reps - 30 hold - Standing Single Leg Stance with Counter Support  - 1 x daily - 7 x weekly - 1 sets - 3 reps - 30 hold - shoulder added horizontal abd and ER green or blue    ASSESSMENT:  CLINICAL IMPRESSION: Pt with increased shoulder pain after session. Cues given for reducing shoulder ROM with chest fly but this may be the culprit. Arrived late due to work issue.  Able to use Reformer to maximize feedback to joints and work multiple areas.  Cont POC.     OBJECTIVE IMPAIRMENTS cardiopulmonary status limiting activity, decreased activity tolerance, decreased balance, decreased coordination, decreased endurance, decreased mobility, difficulty walking, decreased ROM,  decreased strength, dizziness, obesity, and pain.    ACTIVITY LIMITATIONS cleaning, community activity, meal prep, occupation, laundry, yard work, and shopping.    PERSONAL  FACTORS Past/current experiences, Time since onset of injury/illness/exacerbation, and 3+ comorbidities: chronic joint pain, hypersomnelence, migraines, POTS, MCAS  are also affecting patient's functional outcome.      REHAB POTENTIAL: Excellent   CLINICAL DECISION MAKING: Stable/uncomplicated   EVALUATION COMPLEXITY: Low     GOALS: Goals reviewed with patient? No   SHORT TERM GOALS: Target date: 07/03/2021   Pt will be I with HEP for core, pelvic stability  Baseline: unknown Goal status: MET   2.  Pt will complete Survey for hypermobility [facility] Impact Scale) and set goal.  Baseline: given on eval  Goal status: ONGOING   3.  Pt will report increased tolerance for work, home activities, reporting less fatigue overall with 1-2 tasks  Baseline: low energy , hard to do > 1 simple home task  Status: 09/25/21: was able to move recently  Goal status: ONGOING due to other GI issues , HR variability    4  Pt will be screened for balance (DGI) and goal set 24/24.  Baseline: 21/24  Goal status: MET    LONG TERM GOALS: Target date: 08/07/2021   Pt will be able to walk 600 feet or more on the 2 min walk test to demo improved exercise tolerance  Baseline: 434 feet  Status: 461 feet 09/25/21 Goal status: ongoing   2.  Pt will understand and demo proper posture and body mechanics as it pertains to lifting and squatting for work, home tasks  Baseline: needs reinforcement Goal status: ongoing    3.  Pt will be able to walk on uneven ground with improved confidence in balance  Baseline: loses balance  Status: 09/25/21: min improvement to date  Goal status: ongoing , balance screens done    4.  Pt will demo 5/5 strength in UE and LE for maximal joint support  Baseline:4+/5 to 5/5 in LEs  Goal status: INITIAL   5.   Pt will be able to stand on tandem , SLS for light balance work for 30 sec at a time , stable surface Baseline: unable > 10 sec  Status 08/10/21: held tandem 30 sec Status: 09/25/21: L 60 sec , R 60 sec  Goal status: MET      6. Patient will recover from PT session back to baseline level day after PT session. Baseline: Needs 1-2 days  Status: NEW       PLAN: PT FREQUENCY: 1x/week   PT DURATION: 8 weeks   PLANNED INTERVENTIONS: Therapeutic exercises, Therapeutic activity, Neuromuscular re-education, Balance training, Gait training, Patient/Family education, Joint mobilization, Aquatic Therapy, Electrical stimulation, Cryotherapy, Moist heat, Taping, and Manual therapy.   PLAN FOR NEXT SESSION:  Progress HEP, balance challenges, squat progression. Consider using large mat for gait on unlevel surface. Upgrade HEP ??    Raeford Razor, PT 10/12/21 9:38 AM Phone: [phone] Fax: [phone]

## 2021-10-18 ENCOUNTER — Encounter: Payer: Self-pay | Admitting: Physical Therapy

## 2021-10-18 ENCOUNTER — Ambulatory Visit: Payer: 59 | Admitting: Physical Therapy

## 2021-10-18 DIAGNOSIS — M357 Hypermobility syndrome: Secondary | ICD-10-CM

## 2021-10-18 DIAGNOSIS — M2550 Pain in unspecified joint: Secondary | ICD-10-CM

## 2021-10-18 DIAGNOSIS — R2689 Other abnormalities of gait and mobility: Secondary | ICD-10-CM

## 2021-10-18 NOTE — Therapy (Signed)
OUTPATIENT PHYSICAL THERAPY TREATMENT NOTE/    Patient Name: Shelby Dyer MRN: [id number] DOB:1995/09/23, 26 y.o., female Today's Date: 10/18/2021  PCP: Legrand Como, NP REFERRING PROVIDER: Legrand Como, NP   PT End of Session - 10/18/21 1248     Visit Number 13    Number of Visits 16    Date for PT Re-Evaluation 10/31/21    Authorization Type BCBS    PT Start Time 1248   15 minutes late   PT Stop Time 1315    PT Time Calculation (min) 27 min                   Past Medical History:  Diagnosis Date   Anxiety    Past Surgical History:  Procedure Laterality Date   double scope      Tub place in both ear  Bilateral    When she was 26 years of age   27 TOOTH EXTRACTION  2006   All 4   Patient Active Problem List   Diagnosis Date Noted   Chronic idiopathic constipation 10/10/2021   Hip pain 06/13/2021   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   Dysautonomia orthostatic hypotension syndrome 05/17/2021   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  Give seated rest breaks for standing, blood pools in LE   SUBJECTIVE:  I feel like my brain is swimming. I am foggy. I am slurring my words. Left hip subluxed this morning so it is still sore from that.    PAIN:  Are you having pain? Yes:  NPRS scale: 5/10 rated today.  Pain location: Left hip, mid to low back Pain description: achy,  sore Aggravating factors: activity. Back pain (standing long time) and carrying item in R hand Relieving factors: meds, heating pad, rest    OBJECTIVE:    DIAGNOSTIC FINDINGS:  CT last yet   PATIENT SURVEYS:  B[place] Impact Scale  given on eval    COGNITION:            Overall cognitive status: Within functional limits for tasks assessed                          SENSATION: Reports hands, feet in the past but improved with med change  Amrs and hands sleeping AM frequently   MUSCLE LENGTH: Hamstrings: tight   POSTURE:  Forward head, rounded shoulders   PALPATION: NT   LUMBAR ROM:    Active  A/PROM  06/12/2021  Flexion Palms to floor  Extension Hyper  Right lateral flexion    Left lateral flexion    Right rotation Hyper  Left rotation Hyper    (Blank rows = not tested)    CERVICAL ROM: 07/20/21 Extension 75 deg Flexion 40 capital flexion , 60 cervical flexion  Rt sidebending 55 Lt sidebending 49 Rt rot 75  Lt. rot 82 Pain minimal with AROM of C-spine, senses clicking with extension  LE ROM:   Active  Right 06/12/2021 Left 06/12/2021  Hip flexion      Hip extension      Hip abduction      Hip adduction      Hip internal rotation Rel tight Rel tight   Hip external rotation WNL WNL  Knee flexion      Knee extension      Ankle dorsiflexion      Ankle plantarflexion      Ankle inversion      Ankle eversion       (Blank rows = not tested)   LE MMT:   MMT Right 06/12/2021 Left 06/12/2021 Rt  08/10/21 Lt.  08/10/21  Hip flexion 4+ 4+ 5 5  Hip extension 4- 4-    Hip abduction 4 4    Hip adduction        Hip internal rotation        Hip external rotation        Knee flexion 4+ _0 Knee extension 4+ 4+ 5 5  Ankle dorsiflexion 4+ 4+    Ankle plantarflexion        Ankle inversion        Ankle eversion         (Blank rows = not tested)   FUNCTIONAL TESTS:  2 minute walk test: 412 feet , HR 120 max Sa02 99%, 09/25/21: 461 feet  14.3 5 x STS (  07/07/21) Tandem stance 6 sec RLE back (07/07/21) Tandem stance 8 sec LLE back  (07/07/21)  SLS 4-5 sec best bilat  DGI (21/24) -taken 07/07/21     GAIT: Distance walked: 150 Assistive device utilized: None Level of assistance: Complete Independence Comments: none    Today's Treatment   [facility] Adult PT Treatment:                                                DATE: 10/15/21 Therapeutic Exercise: Tra draw ins Clams Unilateral clams Alt 2022/06/19 Bridge Banded Bridge Dead Bug Level 1  Added ball under pelvis and repeated the first 4 exercises above (crossed arms/ EC for 06-19-2022)    [place] Adult PT Treatment:                                                DATE: 10/03/21 Therapeutic Exercise: Pilates Reformer used for LE/core strength, postural strength, lumbopelvic disassociation and core control.  Exercises included:  Reformer Footwork 2 red 1 blue 1 yellow: double leg heels in parallel and ER, narrow and wide  Single leg 2 red 1 blue narrow heels with top leg in table top  SLR x 10  GTB alternating clam x 15 Partial bridge x 10 with 3 springs added all springs for x 10 Partial bridge with alt. Clam x 10    Supine arms 1 red 1 yellow  Arcs and circles x 10 each with directional change  Quadruped 1 red LE to plank x 10  Seated arms 1 red "serving a tray, hug a tree " x 10 each     [facility] Adult PT Treatment:  DATE: 09/25/21 Therapeutic Exercise: 2 MWT 461 feet Squat tap to chair 10 x 2 15 LB STS with single leg back x 10 each (mat table ), no UE SLS 60 sec each achieved Narrow on foam with trunk rotations Tandem on foam with head turns Rocker board lateral weight shifting x 20 Hurdles forward single stepping, alternating- CGA- no difficulty Hurdles side stepping each way- CGA- no difficulty    [facility] Adult PT Treatment:                                                DATE: 09/13/21 Therapeutic Exercise: NuStep L6 for 6 min  Leg press 2  plates x 15 toes parallel and then toes out slightly  Leg extension 25 lbs x 15, 20 lbs x 15 Lat pull down 25 lbs x 15  Row 35 lbs 2 x 15  Freemotion : Diagonal 2 plates x 15 each UE , bilat. Shoulder press x  15 Single arm ER 1 plate x 10 each UE  LTR head turns x 10 Supine core/head press used ring  Overhead lift with Pilates ring  added SLR  x 8  Opp UE/LE press with ring   [facility] Adult PT Treatment:                                                DATE: 09/05/21 Therapeutic Exercise: Sit to stand x 10 with 15 lbs Squats x 10 x 15 lbs x 2  Hip hinge x 10 x 15 lbs x 2  Single leg RDL x 15 used wall for support, no UE for about 5 reps each side  Single leg RDL with 5 lbs and wall x 10  Single leg 5 lbs infinity motion with 30 sec  Overhead shoulder flexion hold 5 lb x 30 sec with high knees march  Foam roller alternating arms x 10 , then bilateral horizontal abduction x 10  Foam roller core: clam (double leg) then unilateral  x 10 each  Dead bug foam roller x 10         PATIENT EDUCATION:  Education details: HEP Person educated: Patient Education method: Explanation Education comprehension: verbalized understanding     HOME EXERCISE PROGRAM: Access Code: [email or web address] URL: [email or web address] Date: 07/07/2021 Prepared by: Hessie Diener  Exercises - Supine Bridge with Spinal Articulation  - 1 x daily - 7 x weekly - 2 sets - 10 reps - Bent Knee Fallouts with Alternating Legs  - 1 x daily - 7 x weekly - 2 sets - 10 reps - Supine March  - 1 x daily - 7 x weekly - 2 sets - 10 reps - Standing Tandem Balance with Counter Support  - 1 x daily - 7 x weekly - 1 sets - 3 reps - 30 hold - Standing Single Leg Stance with Counter Support  - 1 x daily - 7 x weekly - 1 sets - 3 reps - 30 hold - shoulder added horizontal abd and ER green or blue    ASSESSMENT:  CLINICAL IMPRESSION: Pt late today and with reports of "brain Fog and slurring words." She also reported a  subluxed left hip this AM. Opted to concentrate on mat based stability  today. She reports no lingering issue with shoulder pain after last visit.  Cont POC.     OBJECTIVE IMPAIRMENTS cardiopulmonary status limiting activity, decreased activity tolerance, decreased balance, decreased coordination, decreased endurance, decreased mobility, difficulty walking, decreased ROM, decreased strength, dizziness, obesity, and pain.    ACTIVITY LIMITATIONS cleaning, community activity, meal prep, occupation, laundry, yard work, and shopping.    PERSONAL FACTORS Past/current experiences, Time since onset of injury/illness/exacerbation, and 3+ comorbidities: chronic joint pain, hypersomnelence, migraines, POTS, MCAS  are also affecting patient's functional outcome.      REHAB POTENTIAL: Excellent   CLINICAL DECISION MAKING: Stable/uncomplicated   EVALUATION COMPLEXITY: Low     GOALS: Goals reviewed with patient? No   SHORT TERM GOALS: Target date: 07/03/2021   Pt will be I with HEP for core, pelvic stability  Baseline: unknown Goal status: MET   2.  Pt will complete Survey for hypermobility [facility] Impact Scale) and set goal.  Baseline: given on eval  Goal status: ONGOING   3.  Pt will report increased tolerance for work, home activities, reporting less fatigue overall with 1-2 tasks  Baseline: low energy , hard to do > 1 simple home task  Status: 09/25/21: was able to move recently  Goal status: ONGOING due to other GI issues , HR variability    4  Pt will be screened for balance (DGI) and goal set 24/24.  Baseline: 21/24  Goal status: MET    LONG TERM GOALS: Target date: 08/07/2021   Pt will be able to walk 600 feet or more on the 2 min walk test to demo improved exercise tolerance  Baseline: 434 feet  Status: 461 feet 09/25/21 Goal status: ongoing   2.  Pt will understand and demo proper posture and body mechanics as it pertains to lifting and squatting for work, home tasks  Baseline:  needs reinforcement Goal status: ongoing    3.  Pt will be able to walk on uneven ground with improved confidence in balance  Baseline: loses balance  Status: 09/25/21: min improvement to date  Goal status: ongoing , balance screens done    4.  Pt will demo 5/5 strength in UE and LE for maximal joint support  Baseline:4+/5 to 5/5 in LEs  Goal status: INITIAL   5.  Pt will be able to stand on tandem , SLS for light balance work for 30 sec at a time , stable surface Baseline: unable > 10 sec  Status 08/10/21: held tandem 30 sec Status: 09/25/21: L 60 sec , R 60 sec  Goal status: MET      6. Patient will recover from PT session back to baseline level day after PT session. Baseline: Needs 1-2 days  Status: NEW       PLAN: PT FREQUENCY: 1x/week   PT DURATION: 8 weeks   PLANNED INTERVENTIONS: Therapeutic exercises, Therapeutic activity, Neuromuscular re-education, Balance training, Gait training, Patient/Family education, Joint mobilization, Aquatic Therapy, Electrical stimulation, Cryotherapy, Moist heat, Taping, and Manual therapy.   PLAN FOR NEXT SESSION:  Progress HEP, balance challenges, squat progression. Consider using large mat for gait on unlevel surface. Upgrade HEP ??   Hessie Diener, PTA 10/18/21 1:22 PM Phone: [phone] Fax: [phone]

## 2021-10-23 ENCOUNTER — Encounter: Payer: Self-pay | Admitting: Gastroenterology

## 2021-10-23 ENCOUNTER — Ambulatory Visit (INDEPENDENT_AMBULATORY_CARE_PROVIDER_SITE_OTHER): Payer: BLUE CROSS/BLUE SHIELD | Admitting: Gastroenterology

## 2021-10-23 VITALS — BP 118/86 | HR 90 | Ht 69.0 in | Wt 225.0 lb

## 2021-10-23 DIAGNOSIS — R110 Nausea: Secondary | ICD-10-CM

## 2021-10-23 DIAGNOSIS — K5900 Constipation, unspecified: Secondary | ICD-10-CM

## 2021-10-23 DIAGNOSIS — R6881 Early satiety: Secondary | ICD-10-CM | POA: Diagnosis not present

## 2021-10-23 NOTE — Progress Notes (Unsigned)
Chief Complaint:    Constipation  GI History: 26 y.o. female with a history of hypermobility syndrome (Ehlers-Danlos syndrome), anxiety, migraines, initially seen in the GI clinic on 07/17/2021 for evaluation of longstanding history of chronic constipation, and recent loss of appetite, nausea, and weight loss.  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  Can have episodes of diarrhea/loose stools.  Has used MiraLAX prn constipation, then Dulcolax if the MiraLAX is not efficacious. - 07/17/2021: Initial appointment in GI clinic - 07/28/2021: Colonoscopy: Large amounts of retained stool which precluded visualization and advancement of the colonoscope beyond the distal descending colon.  Limited visualized mucosa was otherwise normal (path benign).  Recommended repeat with extended 2-day prep and started on Linzess 145 mcg daily - 08/22/2021: Sitz marker study positive with 21 retained markers, including 16 in the ascending colon and 5 in the distal colon/rectum. - 08/13/2021: GI follow-up appointment.  Still with decreased appetite and early satiety.  Normal TSH - 09/12/2021: Colonoscopy: Normal colon.  Started on Linzess 290 mcg/day, continued MiraLAX 1 cap/day but still constipation.  Started Bentyl 10 mg prn with improvement in abdominal cramping - 09/21/2021: Evaluated at [facility] for mast cell activation "cluster" syndrome (i.e. 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  Recommended changing Zyrtec to Allegra, prescribed Atarax prn, continuing Pepcid, ketotifen eyedrops by mouth, Xolair.  Normal CBC, CMP, ESR, CRP, C4, TSH - 10/10/2021: Started Motegrity     Separately, developed nausea in 01/2021, then decreased appetite in 06/2021.  No emesis. - 07/28/2021: EGD: Normal - GES ordered  HPI:     Patient is a 26 y.o. female presenting to the Gastroenterology Clinic for follow-up.   She started  Motegrity 10 days ago, and reports this worked for 2 days, then subsequent rockhard pebbles stools.  Reintroduced MiraLAX and Dulcolax, but still only 2-3 hard BM per week. Can have single episodes of diarrhea, but then reverts back to hard stools. Taking Pamelor 75 mg qhs, but hasnt noticed much change with GI sxs.   Still with nausea and decreased appetite/early satiety.     Review of systems:     No chest pain, no SOB, no fevers, no urinary sx   Past Medical History:  Diagnosis Date   Anxiety     Patient's surgical history, family medical history, social history, medications and allergies were all reviewed in Epic    Current Outpatient Medications  Medication Sig Dispense Refill   ALPRAZolam (XANAX XR) 0.5 MG 24 hr tablet Take 0.5 mg by mouth as needed for anxiety.     Ascorbic Acid (VITAMIN C PO) Take 500-2,000 mg by mouth daily in the afternoon.     cetirizine (ZYRTEC) 10 MG tablet Take 20 mg by mouth daily.     cromolyn (NASALCROM) 5.2 MG/ACT nasal spray 1-2 sprays as needed.     dicyclomine (BENTYL) 10 MG capsule Take 1 capsule (10 mg total) by mouth every 6 (six) hours as needed for spasms (abdominal pain, cramping). 90 capsule 0   EPINEPHrine (PRIMATENE MIST) 0.125 MG/ACT AERO 1-2 puffs as needed.     EPINEPHrine 0.3 mg/0.3 mL IJ SOAJ injection Inject 0.3 mg into the muscle as needed for anaphylaxis.     famotidine (PEPCID) 40 MG tablet Take 40 mg by mouth 2 (two) times daily.     hydrOXYzine (ATARAX) 25 MG tablet Take 25-50 mg by mouth every 8 (eight) hours as needed (allergies).  ketotifen (ZADITOR) 0.025 % ophthalmic solution 4 drops as needed. By mouth     levonorgestrel (MIRENA) 20 MCG/24HR IUD 1 each by Intrauterine route once.     metoprolol tartrate (LOPRESSOR) 25 MG tablet Take 25 mg by mouth 2 (two) times daily.     Multiple Vitamin (MULTIVITAMIN) tablet Take 1 tablet by mouth daily.     nortriptyline (PAMELOR) 25 MG capsule Take 75 mg by mouth daily after supper.      ondansetron (ZOFRAN) 4 MG tablet Take 1 tablet (4 mg total) by mouth every 6 (six) hours as needed for nausea or vomiting. 12 tablet 0   Prucalopride Succinate (MOTEGRITY) 2 MG TABS Take 1 tablet (2 mg total) by mouth daily. 60 tablet 3   Riboflavin 100 MG TABS Take 200 mg by mouth daily in the afternoon.     No current facility-administered medications for this visit.    Physical Exam:     Ht 5' 9"  (1.753 m)   Wt 225 lb (102.1 kg)   BMI 33.23 kg/m   GENERAL:  Pleasant female in NAD PSYCH: : Cooperative, normal affect Musculoskeletal:  Normal muscle tone, normal strength NEURO: Alert and oriented x 3, no focal neurologic deficits   IMPRESSION and PLAN:    1) Chronic constipation - Suboptimal response to multiple medications.  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  Based on location of retained markers, lower suspicion for pelvic floor dyssynergia - Referral to [facility] for further evaluation (patient was closer to [facility] and already follows with other subspecialist there) - Consideration for brain imaging - Continue Motegrity, MiraLAX, Dulcolax for the time being - Continue adequate hydration as currently doing  2) EDS 3) Dysautonomia/POTS - As above, referral to [facility] for potential overlapping pathology  4) Nausea 5) Early satiety - GES  I spent 30 minutes of time, including in depth chart review, independent review of results as outlined above, communicating results with the patient directly, face-to-face time with the patient, coordinating care, and ordering studies and medications as appropriate, and documentation.          [place] ,DO, FACG 10/23/2021, 1:19 PM

## 2021-10-23 NOTE — Patient Instructions (Addendum)
If you are age 26 or younger, your body mass index should be between 19-25. Your Body mass index is 33.23 kg/m. If this is out of the aformentioned range listed, please consider follow up with your Primary Care Provider.   __________________________________________________________  The [place] [facility] providers would like to encourage you to use [facility] to communicate with providers for non-urgent requests or questions.  Due to long hold times on the telephone, sending your provider a message by [facility] may be a faster and more efficient way to get a response.  Please allow 48 business hours for a response.  Please remember that this is for non-urgent requests.   Due to recent changes in healthcare laws, you may see the results of your imaging and laboratory studies on MyChart before your provider has had a chance to review them.  We understand that in some cases there may be results that are confusing or concerning to you. Not all laboratory results come back in the same time frame and the provider may be waiting for multiple results in order to interpret others.  Please give [place] 48 hours in order for your provider to thoroughly review all the results before contacting the office for clarification of your results.   You have been scheduled for a gastric emptying scan at [facility] on __________ at ____________. Please arrive at least 15 minutes prior to your appointment for registration. Please make certain not to have anything to eat or drink after midnight the night before your test. Hold all stomach medications (ex: Zofran, phenergan, Reglan) 48 hours prior to your test. If you need to reschedule your appointment, please contact radiology scheduling at [phone]. _____________________________________________________________________ A gastric-emptying study measures how long it takes for food to move through your stomach. There are several ways to measure stomach emptying. In the most common  test, you eat food that contains a small amount of radioactive material. A scanner that detects the movement of the radioactive material is placed over your abdomen to monitor the rate at which food leaves your stomach. This test normally takes about 4 hours to complete. _____________________________________________________________________   Shelby Dyer will be contacted by [facility]  to schedule a consult.   Thank you for choosing me and [facility].  Vito Cirigliano, D.O.

## 2021-10-25 NOTE — Telephone Encounter (Signed)
Referral sent by Mick Sell at office visit.

## 2021-10-31 ENCOUNTER — Encounter: Payer: Self-pay | Admitting: Gastroenterology

## 2021-10-31 NOTE — Telephone Encounter (Signed)
Pt called to see which pharmacy motegrity was sent to. Let her know it was sent to [facility] [address] in [place]. Pt also wanted to know a contact number for referral to [facility] that was faxed. Gave pt number on [facility] referral form: [phone].

## 2021-11-01 NOTE — Therapy (Unsigned)
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[facility])    Past Surgical History:  Procedure Laterality Date   double scope      Tub place in both ear  Bilateral    When she was 26 years of age   10 TOOTH EXTRACTION  2006   All 4   Patient Active Problem List   Diagnosis Date Noted   Chronic idiopathic constipation 10/10/2021   Hip pain 06/13/2021   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   Dysautonomia orthostatic hypotension syndrome 05/17/2021   Mast cell activation ([place]) 05/17/2021   Intractable periodic headache syndrome 05/17/2021   IBS (irritable bowel syndrome) 04/03/2021   Median nerve neuritis, right 06/25/2019   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  PERTINENT HISTORY: Migraines, hypermobility  MCAS, tachycardia, IBS Her mother has EDS and her sister has POTS  PRECAUTIONS: Other: POTS , positional changes.  Give seated rest breaks for standing, blood pools in LE   SUBJECTIVE:  I feel like my brain is swimming. I am foggy. I am slurring my words. Left hip subluxed this morning so it is still sore from that.    PAIN:  Are you having pain? Yes:  NPRS scale: 5/10 rated today.  Pain location: Left hip, mid to low back Pain description: achy, sore Aggravating factors: activity. Back pain (standing long time) and carrying item in R hand Relieving factors: meds, heating pad, rest    OBJECTIVE:    DIAGNOSTIC FINDINGS:  CT last yet   PATIENT SURVEYS:  [place] Impact Scale  given on eval    COGNITION:            Overall  cognitive status: Within functional limits for tasks assessed                          SENSATION: Reports hands, feet in the past but improved with med change  Amrs and hands sleeping AM frequently   MUSCLE LENGTH: Hamstrings: tight   POSTURE:  Forward head, rounded shoulders   PALPATION: NT   LUMBAR ROM:    Active  A/PROM  06/12/2021  Flexion [place] to floor  Extension Hyper  Right lateral flexion    Left lateral flexion    Right rotation Hyper  Left rotation Hyper    (Blank rows = not tested)    CERVICAL ROM: 07/20/21 Extension 75 deg Flexion 40 capital flexion , 60 cervical flexion  Rt sidebending 55 Lt sidebending 49 Rt rot 75  Lt. rot 82 Pain minimal with AROM of C-spine, senses clicking with extension  LE ROM:   Active  Right 06/12/2021 Left 06/12/2021  Hip flexion      Hip extension      Hip abduction      Hip adduction      Hip internal rotation Rel tight Rel tight   Hip external rotation WNL WNL  Knee flexion      Knee extension      Ankle dorsiflexion      Ankle plantarflexion  Ankle inversion      Ankle eversion       (Blank rows = not tested)   LE MMT:   MMT Right 06/12/2021 Left 06/12/2021 Rt  08/10/21 Lt.  08/10/21  Hip flexion 4+ 4+ 5 5  Hip extension 4- 4-    Hip abduction 4 4    Hip adduction        Hip internal rotation        Hip external rotation        Knee flexion 4+ 4 5 5   Knee extension 4+ 4+ 5 5  Ankle dorsiflexion 4+ 4+    Ankle plantarflexion        Ankle inversion        Ankle eversion         (Blank rows = not tested)   FUNCTIONAL TESTS:  2 minute walk test: 412 feet , HR 120 max Sa02 99%, 09/25/21: 461 feet  14.3 5 x STS ( 07/07/21) Tandem stance 6 sec RLE back (07/07/21) Tandem stance 8 sec LLE back  (07/07/21)  SLS 4-5 sec best bilat  DGI (21/24) -taken 07/07/21     GAIT: Distance walked: 150 Assistive device utilized: None Level of assistance: Complete Independence Comments: none    Today's  Treatment  [facility] Adult PT Treatment:                                                DATE: 11/01/21 Therapeutic Exercise: *** Manual Therapy: *** Neuromuscular re-ed: *** Therapeutic Activity: *** Modalities: *** Self Care: ***  [facility] Adult PT Treatment:                                                DATE: 10/15/21 Therapeutic Exercise: Tra draw ins Clams Unilateral clams Alt 2022-07-17 Bridge Banded Bridge Dead Bug Level 1  Added ball under pelvis and repeated the first 4 exercises above (crossed arms/ EC for Jul 17, 2022)    [place] Adult PT Treatment:                                                DATE: 10/03/21 Therapeutic Exercise: Pilates Reformer used for LE/core strength, postural strength, lumbopelvic disassociation and core control.  Exercises included:  Reformer Footwork 2 red 1 blue 1 yellow: double leg heels in parallel and ER, narrow and wide  Single leg 2 red 1 blue narrow heels with top leg in table top  SLR x 10  GTB alternating clam x 15 Partial bridge x 10 with 3 springs added all springs for x 10 Partial bridge with alt. Clam x 10    Supine arms 1 red 1 yellow  Arcs and circles x 10 each with directional change  Quadruped 1 red LE to plank x 10  Seated arms 1 red "serving a tray, hug a tree " x 10 each     [facility] Adult PT Treatment:  DATE: 09/25/21 Therapeutic Exercise: 2 MWT 461 feet Squat tap to chair 10 x 2 15 LB STS with single leg back x 10 each (mat table ), no UE SLS 60 sec each achieved Narrow on foam with trunk rotations Tandem on foam with head turns Rocker board lateral weight shifting x 20 Hurdles forward single stepping, alternating- CGA- no difficulty Hurdles side stepping each way- CGA- no difficulty    [facility] Adult PT Treatment:                                                DATE: 09/13/21 Therapeutic Exercise: NuStep L6 for 6 min  Leg press 2 plates x 15 toes parallel and then toes out slightly  Leg  extension 25 lbs x 15, 20 lbs x 15 Lat pull down 25 lbs x 15  Row 35 lbs 2 x 15  Freemotion : Diagonal 2 plates x 15 each UE , bilat. Shoulder press x  15 Single arm ER 1 plate x 10 each UE  LTR head turns x 10 Supine core/head press used ring  Overhead lift with Pilates ring  added SLR  x 8  Opp UE/LE press with ring   [facility] Adult PT Treatment:                                                DATE: 09/05/21 Therapeutic Exercise: Sit to stand x 10 with 15 lbs Squats x 10 x 15 lbs x 2  Hip hinge x 10 x 15 lbs x 2  Single leg RDL x 15 used wall for support, no UE for about 5 reps each side  Single leg RDL with 5 lbs and wall x 10  Single leg 5 lbs infinity motion with 30 sec  Overhead shoulder flexion hold 5 lb x 30 sec with high knees march  Foam roller alternating arms x 10 , then bilateral horizontal abduction x 10  Foam roller core: clam (double leg) then unilateral  x 10 each  Dead bug foam roller x 10         PATIENT EDUCATION:  Education details: HEP Person educated: Patient Education method: Explanation Education comprehension: verbalized understanding     HOME EXERCISE PROGRAM: Access Code: [email or web address] URL: [email or web address] Date: 07/07/2021 Prepared by: Hessie Diener  Exercises - Supine Bridge with Spinal Articulation  - 1 x daily - 7 x weekly - 2 sets - 10 reps - Bent Knee Fallouts with Alternating Legs  - 1 x daily - 7 x weekly - 2 sets - 10 reps - Supine March  - 1 x daily - 7 x weekly - 2 sets - 10 reps - Standing Tandem Balance with Counter Support  - 1 x daily - 7 x weekly - 1 sets - 3 reps - 30 hold - Standing Single Leg Stance with Counter Support  - 1 x daily - 7 x weekly - 1 sets - 3 reps - 30 hold - shoulder added horizontal abd and ER green or blue    ASSESSMENT:  CLINICAL IMPRESSION: Pt late today and with reports of "brain Fog and slurring words." She also reported a subluxed left hip this AM. Opted to concentrate on mat based  stability  today. She reports no lingering issue with shoulder pain after last visit.  Cont POC.     OBJECTIVE IMPAIRMENTS cardiopulmonary status limiting activity, decreased activity tolerance, decreased balance, decreased coordination, decreased endurance, decreased mobility, difficulty walking, decreased ROM, decreased strength, dizziness, obesity, and pain.    ACTIVITY LIMITATIONS cleaning, community activity, meal prep, occupation, laundry, yard work, and shopping.    PERSONAL FACTORS Past/current experiences, Time since onset of injury/illness/exacerbation, and 3+ comorbidities: chronic joint pain, hypersomnelence, migraines, POTS, MCAS  are also affecting patient's functional outcome.      REHAB POTENTIAL: Excellent   CLINICAL DECISION MAKING: Stable/uncomplicated   EVALUATION COMPLEXITY: Low     GOALS: Goals reviewed with patient? No   SHORT TERM GOALS: Target date: 07/03/2021   Pt will be I with HEP for core, pelvic stability  Baseline: unknown Goal status: MET   2.  Pt will complete Survey for hypermobility [facility] Impact Scale) and set goal.  Baseline: given on eval  Goal status: ONGOING   3.  Pt will report increased tolerance for work, home activities, reporting less fatigue overall with 1-2 tasks  Baseline: low energy , hard to do > 1 simple home task  Status: 09/25/21: was able to move recently  Goal status: ONGOING due to other GI issues , HR variability    4  Pt will be screened for balance (DGI) and goal set 24/24.  Baseline: 21/24  Goal status: MET    LONG TERM GOALS: Target date: 08/07/2021   Pt will be able to walk 600 feet or more on the 2 min walk test to demo improved exercise tolerance  Baseline: 434 feet  Status: 461 feet 09/25/21 Goal status: ongoing   2.  Pt will understand and demo proper posture and body mechanics as it pertains to lifting and squatting for work, home tasks  Baseline: needs reinforcement Goal status: ongoing    3.  Pt will be  able to walk on uneven ground with improved confidence in balance  Baseline: loses balance  Status: 09/25/21: min improvement to date  Goal status: ongoing , balance screens done    4.  Pt will demo 5/5 strength in UE and LE for maximal joint support  Baseline:4+/5 to 5/5 in LEs  Goal status: INITIAL   5.  Pt will be able to stand on tandem , SLS for light balance work for 30 sec at a time , stable surface Baseline: unable > 10 sec  Status 08/10/21: held tandem 30 sec Status: 09/25/21: L 60 sec , R 60 sec  Goal status: MET      6. Patient will recover from PT session back to baseline level day after PT session. Baseline: Needs 1-2 days  Status: NEW       PLAN: PT FREQUENCY: 1x/week   PT DURATION: 8 weeks   PLANNED INTERVENTIONS: Therapeutic exercises, Therapeutic activity, Neuromuscular re-education, Balance training, Gait training, Patient/Family education, Joint mobilization, Aquatic Therapy, Electrical stimulation, Cryotherapy, Moist heat, Taping, and Manual therapy.   PLAN FOR NEXT SESSION:  Progress HEP, balance challenges, squat progression. Consider using large mat for gait on unlevel surface. Upgrade HEP ??   Hessie Diener, PTA 11/01/21 1:24 PM Phone: [phone] Fax: [phone]

## 2021-11-02 ENCOUNTER — Ambulatory Visit: Payer: 59 | Attending: Family Medicine | Admitting: Physical Therapy

## 2021-11-02 ENCOUNTER — Encounter: Payer: Self-pay | Admitting: Physical Therapy

## 2021-11-02 DIAGNOSIS — R2689 Other abnormalities of gait and mobility: Secondary | ICD-10-CM | POA: Diagnosis present

## 2021-11-02 DIAGNOSIS — M2550 Pain in unspecified joint: Secondary | ICD-10-CM | POA: Diagnosis present

## 2021-11-02 DIAGNOSIS — M357 Hypermobility syndrome: Secondary | ICD-10-CM | POA: Insufficient documentation

## 2021-11-06 ENCOUNTER — Ambulatory Visit: Payer: 59 | Admitting: Physical Therapy

## 2021-11-06 ENCOUNTER — Ambulatory Visit (INDEPENDENT_AMBULATORY_CARE_PROVIDER_SITE_OTHER): Payer: 59 | Admitting: Family Medicine

## 2021-11-06 ENCOUNTER — Encounter: Payer: Self-pay | Admitting: Family Medicine

## 2021-11-06 VITALS — BP 132/82 | HR 70 | Temp 97.4°F | Ht 68.0 in | Wt 226.6 lb

## 2021-11-06 DIAGNOSIS — F4310 Post-traumatic stress disorder, unspecified: Secondary | ICD-10-CM | POA: Diagnosis not present

## 2021-11-06 DIAGNOSIS — Q796 Ehlers-Danlos syndrome, unspecified: Secondary | ICD-10-CM | POA: Diagnosis not present

## 2021-11-06 DIAGNOSIS — K589 Irritable bowel syndrome without diarrhea: Secondary | ICD-10-CM

## 2021-11-06 DIAGNOSIS — I951 Orthostatic hypotension: Secondary | ICD-10-CM

## 2021-11-06 DIAGNOSIS — Z124 Encounter for screening for malignant neoplasm of cervix: Secondary | ICD-10-CM

## 2021-11-06 DIAGNOSIS — G43C1 Periodic headache syndromes in child or adult, intractable: Secondary | ICD-10-CM

## 2021-11-06 DIAGNOSIS — D8940 Mast cell activation, unspecified: Secondary | ICD-10-CM

## 2021-11-06 MED ORDER — EPINEPHRINE 0.3 MG/0.3ML IJ SOAJ: 0.3000 mg | INTRAMUSCULAR | 3 refills | Status: AC | PRN

## 2021-11-06 NOTE — Patient Instructions (Addendum)
We have referred to OB/GYN and neurology. We refilled you Epipen. We completed you Handicap placard.
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
## 2021-11-06 NOTE — Assessment & Plan Note (Signed)
Palpitations stable on Toprol Has upcoming appointment with dysautonomia clinic at [facility]

## 2021-11-06 NOTE — Assessment & Plan Note (Signed)
Multiple immunomodulators as listed in HPI Follows with [facility] and allergy Refill epinephrine pen

## 2021-11-06 NOTE — Assessment & Plan Note (Signed)
Prophylaxis with nortriptyline and metoprolol Referral to neurology

## 2021-11-06 NOTE — Assessment & Plan Note (Signed)
With multiple sequelae as listed in HPI Multiple specialties as listed in HPI Continue to follow

## 2021-11-06 NOTE — Assessment & Plan Note (Signed)
Medications as listed in HPI [facility] Continue to follow

## 2021-11-14 ENCOUNTER — Encounter: Payer: Self-pay | Admitting: Family Medicine

## 2021-11-14 ENCOUNTER — Ambulatory Visit (INDEPENDENT_AMBULATORY_CARE_PROVIDER_SITE_OTHER): Payer: 59 | Admitting: Family Medicine

## 2021-11-14 ENCOUNTER — Encounter (HOSPITAL_COMMUNITY)
Admission: RE | Admit: 2021-11-14 | Discharge: 2021-11-14 | Disposition: A | Discharge status: Home / Self Care | Payer: 59 | Source: Ambulatory Visit | Attending: Gastroenterology | Admitting: Gastroenterology

## 2021-11-14 ENCOUNTER — Ambulatory Visit (HOSPITAL_COMMUNITY)
Admission: RE | Admit: 2021-11-14 | Discharge: 2021-11-14 | Disposition: A | Discharge status: Home / Self Care | Payer: 59 | Source: Ambulatory Visit | Attending: Family Medicine | Admitting: Family Medicine

## 2021-11-14 ENCOUNTER — Ambulatory Visit: Payer: Self-pay

## 2021-11-14 VITALS — BP 117/84 | Ht 68.0 in | Wt 226.0 lb

## 2021-11-14 DIAGNOSIS — R6881 Early satiety: Secondary | ICD-10-CM | POA: Insufficient documentation

## 2021-11-14 DIAGNOSIS — M7918 Myalgia, other site: Secondary | ICD-10-CM

## 2021-11-14 DIAGNOSIS — M222X2 Patellofemoral disorders, left knee: Secondary | ICD-10-CM

## 2021-11-14 DIAGNOSIS — M222X1 Patellofemoral disorders, right knee: Secondary | ICD-10-CM

## 2021-11-14 DIAGNOSIS — K5900 Constipation, unspecified: Secondary | ICD-10-CM | POA: Diagnosis present

## 2021-11-14 DIAGNOSIS — R110 Nausea: Secondary | ICD-10-CM | POA: Insufficient documentation

## 2021-11-14 HISTORY — DX: Myalgia, other site: M79.18

## 2021-11-14 MED ORDER — METHYLPREDNISOLONE ACETATE 40 MG/ML IJ SUSP
40.0000 mg | Freq: Once | INTRAMUSCULAR | Status: AC
  Administered 2021-11-14: 40 mg via INTRAMUSCULAR

## 2021-11-14 MED ORDER — TECHNETIUM TC 99M SULFUR COLLOID
2.0000 | Freq: Once | INTRAVENOUS | Status: AC | PRN
  Administered 2021-11-14: 2 via INTRAVENOUS

## 2021-11-14 NOTE — Assessment & Plan Note (Signed)
Acutely occurring.  Pain is bilateral in the upper back and neck. -Counseled on home exercise therapy and supportive care. -Trigger point injections. -X-ray. -Cervical soft collar. -Could consider shockwave therapy

## 2021-11-14 NOTE — Progress Notes (Signed)
  Shelby Dyer - 26 y.o. female MRN [id number]  Date of birth: May 31, 1995  SUBJECTIVE:  Including CC & ROS.  No chief complaint on file.   Shelby Dyer is a 26 y.o. female that is presenting with acute neck pain and bilateral knee pain.   Review of Systems See HPI   HISTORY: Past Medical, Surgical, Social, and Family History Reviewed & Updated per EMR.   Pertinent Historical Findings include:  Past Medical History:  Diagnosis Date   Allergy 2000   Hay fever, mcas, eczema, etc   Anemia 2020   Off/on low hct and/or low hemoglobin   Anxiety    Asthma Early 2021   Depression 2012?   Mast cell activation syndrome (HCC)    Myofascial pain syndrome, cervical 11/14/2021   Ulcer 2018   Stomach ulcer    Past Surgical History:  Procedure Laterality Date   double scope      Tub place in both ear  Bilateral    When she was 26 years of age   WISDOM TOOTH EXTRACTION  2006   All 4     PHYSICAL EXAM:  VS: BP 117/84 (BP Location: Left Arm, Patient Position: Sitting)   Ht 5\' 8"  (1.727 m)   Wt 226 lb (102.5 kg)   BMI 34.36 kg/m  Physical Exam Gen: NAD, alert, cooperative with exam, well-appearing MSK:  Neurovascularly intact    Limited ultrasound: neck:  The posterior neck was observed with no acute changes. Normal muscular architecture.  Summary: No structural changes appreciated  Ultrasound and interpretation by , MD  Aspiration/Injection Procedure Note Shelby Dyer 11-12-1995  Procedure: Injection Indications: Neck pain  Procedure Details Consent: Risks of procedure as well as the alternatives and risks of each were explained to the (patient/caregiver).  Consent for procedure obtained. Time Out: Verified patient identification, verified procedure, site/side was marked, verified correct patient position, special equipment/implants available, medications/allergies/relevent history reviewed, required imaging and test results available.  Performed.   The area was cleaned with iodine and alcohol swabs.    The trapezius, splenius capitis and supraspinatus was injected using 1 cc's of 40 mg Depo-Medrol and 4 cc's of 0.25% bupivacaine with a 25 1 1/2" needle.  Ultrasound was used. Images were obtained in short views showing the injection.     A sterile dressing was applied.  Patient did tolerate procedure well.     ASSESSMENT & PLAN:   Patellofemoral pain syndrome of both knees Acute on chronic in nature.  Pain is likely associated with her hypermobility. -Counseled on home exercise therapy and supportive care. -Could consider injection or orthotics or further imaging.  Myofascial pain syndrome, cervical Acutely occurring.  Pain is bilateral in the upper back and neck. -Counseled on home exercise therapy and supportive care. -Trigger point injections. -X-ray. -Cervical soft collar. -Could consider shockwave therapy

## 2021-11-14 NOTE — Assessment & Plan Note (Signed)
Acute on chronic in nature.  Pain is likely associated with her hypermobility. -Counseled on home exercise therapy and supportive care. -Could consider injection or orthotics or further imaging.

## 2021-11-14 NOTE — Patient Instructions (Signed)
Good to see you Please use heat  Please try the exercises  You can consider compression   I will call with the xray results.  Please send me a message in MyChart with any questions or updates.  Please see me back in 4 weeks.   --Dr. Jordan Likes

## 2021-11-16 ENCOUNTER — Telehealth: Payer: Self-pay | Admitting: Family Medicine

## 2021-11-16 NOTE — Telephone Encounter (Signed)
Informed of results.   Myra Rude, MD [facility] 11/16/2021, 8:55 AM

## 2021-11-19 ENCOUNTER — Encounter: Payer: Self-pay | Admitting: Family Medicine

## 2022-01-01 ENCOUNTER — Encounter: Payer: Self-pay | Admitting: Family Medicine

## 2022-01-01 ENCOUNTER — Ambulatory Visit (INDEPENDENT_AMBULATORY_CARE_PROVIDER_SITE_OTHER): Payer: 59 | Admitting: Family Medicine

## 2022-01-01 VITALS — BP 124/82 | HR 90 | Temp 97.5°F | Wt 230.2 lb

## 2022-01-01 DIAGNOSIS — W460XXA Contact with hypodermic needle, initial encounter: Secondary | ICD-10-CM | POA: Insufficient documentation

## 2022-01-01 DIAGNOSIS — S61231A Puncture wound without foreign body of left index finger without damage to nail, initial encounter: Secondary | ICD-10-CM

## 2022-01-01 HISTORY — DX: Contact with hypodermic needle, initial encounter: W46.0XXA

## 2022-01-01 MED ORDER — CEPHALEXIN 500 MG PO CAPS: 500.0000 mg | ORAL_CAPSULE | Freq: Two times a day (BID) | ORAL | 0 refills | Status: AC

## 2022-01-01 NOTE — Progress Notes (Signed)
Assessment/Plan:   Problem List Items Addressed This Visit       Other   Needle stick, hypodermic, accidental, initial encounter - Primary    Left index finger Did discuss risk of blood-borne pathogens, patient declined testing at this time Mildly tender to palpation, no obvious or gross signs of infection Given intermittent tenderness and swelling, can trial short course of antibiotics if there is an occult infection If improved with antibiotics, can consider more advanced imaging including ultrasound versus MRI If severe, patient follow-up regarding which department      Relevant Medications   cephALEXin (KEFLEX) 500 MG capsule       Subjective:  HPI:  Shelby Dyer is a 26 y.o. female who has Hypermobility syndrome; Dysautonomia orthostatic hypotension syndrome; Mast cell activation (HCC); Intractable periodic headache syndrome; Inappropriate sinus tachycardia; Median nerve neuritis, right; Idiopathic hypersomnolence; IBS (irritable bowel syndrome); Hip pain; Chronic idiopathic constipation; Ehlers-Danlos syndrome; PTSD (post-traumatic stress disorder); Patellofemoral pain syndrome of both knees; Myofascial pain syndrome, cervical; and Needle stick, hypodermic, accidental, initial encounter on their problem list..   She  has a past medical history of Allergy (2000), Anemia (2020), Anxiety, Asthma (Early 2021), Depression (2012?), Mast cell activation syndrome (HCC), Myofascial pain syndrome, cervical (11/14/2021), and Ulcer (2018)..   She presents with chief complaint of Needle stick (Needle stick (Clean needle) x 2 weeks ago. Left index finger still painful ) .   Needlestick.  Patient had an accidental needlestick on her left index finger-2 weeks ago.  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  The needle penetrated through a nitrile glove.  The needle had not been injected into animal.  Since then, patient reports intermittent swelling tenderness,  particularly worse in the morning.  It is not getting better or worse.  She has had no drainage, fevers, chills.      Past Surgical History:  Procedure Laterality Date   double scope      Tub place in both ear  Bilateral    When she was 26 years of age   WISDOM TOOTH EXTRACTION  2006   All 4    Outpatient Medications Prior to Visit  Medication Sig Dispense Refill   ALPRAZolam (XANAX XR) 0.5 MG 24 hr tablet Take 0.5 mg by mouth as needed for anxiety.     Ascorbic Acid (VITAMIN C PO) Take 500-2,000 mg by mouth daily in the afternoon.     cetirizine (ZYRTEC) 10 MG tablet Take 20 mg by mouth in the morning and at bedtime.     cromolyn (NASALCROM) 5.2 MG/ACT nasal spray 1-2 sprays as needed.     EPINEPHrine (PRIMATENE MIST) 0.125 MG/ACT AERO 1-2 puffs as needed.     EPINEPHrine 0.3 mg/0.3 mL IJ SOAJ injection Inject 0.3 mg into the muscle as needed for anaphylaxis. 1 each 3   famotidine (PEPCID) 40 MG tablet Take 40 mg by mouth 2 (two) times daily.     hydrOXYzine (ATARAX) 25 MG tablet Take 25-50 mg by mouth every 8 (eight) hours as needed (allergies).     ketotifen (ZADITOR) 0.025 % ophthalmic solution 4 drops as needed. By mouth, up to 4 times a day     levonorgestrel (MIRENA) 20 MCG/24HR IUD 1 each by Intrauterine route once.     metoprolol tartrate (LOPRESSOR) 25 MG tablet Take 25 mg by mouth 2 (two) times daily.     Multiple Vitamin (MULTIVITAMIN) tablet Take 1 tablet by mouth daily.  Multiple Vitamins-Minerals (ZINC PO) Take by mouth.     nortriptyline (PAMELOR) 25 MG capsule Take 75 mg by mouth daily after supper.     ondansetron (ZOFRAN) 4 MG tablet Take 1 tablet (4 mg total) by mouth every 6 (six) hours as needed for nausea or vomiting. 12 tablet 0   Polyethylene Glycol 3350 (MIRALAX PO) Take by mouth.     Prucalopride Succinate (MOTEGRITY) 2 MG TABS Take 1 tablet (2 mg total) by mouth daily. 60 tablet 3   QUERCETIN PO Take 500-2,000 mg by mouth daily in the afternoon.      Riboflavin 100 MG TABS Take 200 mg by mouth daily in the afternoon.     dicyclomine (BENTYL) 10 MG capsule Take 1 capsule (10 mg total) by mouth every 6 (six) hours as needed for spasms (abdominal pain, cramping). (Patient not taking: Reported on 01/01/2022) 90 capsule 0   No facility-administered medications prior to visit.    Family History  Problem Relation Age of Onset   Anxiety disorder Mother    Depression Mother    Atrial fibrillation Father    Other Sister    Hypertension Brother    Early death Maternal Aunt    Kidney disease Maternal Grandfather    Stroke Maternal Grandfather    Breast cancer Paternal Grandmother    Cancer Paternal Grandmother    Varicose Veins Paternal Grandmother    Cancer Paternal Grandfather    Anxiety disorder Sister    Depression Sister    Learning disabilities Sister    Asthma Brother    Learning disabilities Brother    Depression Brother    Hypertension Brother    Learning disabilities Brother    Obesity Brother    Colon cancer Neg Hx    Stomach cancer Neg Hx    Liver cancer Neg Hx    Pancreatic cancer Neg Hx     Social History   Socioeconomic History   Marital status: Married    Spouse name: Sport and exercise psychologist   Number of children: Not on file   Years of education: Not on file   Highest education level: Not on file  Occupational History   Occupation: Camera operator    Comment: [employer]  Tobacco Use   Smoking status: Former    Packs/day: 0.50    Years: 2.00    Total pack years: 1.00    Types: Cigarettes    Quit date: 03/26/2016    Years since quitting: 5.7   Smokeless tobacco: Never  Vaping Use   Vaping Use: Never used  Substance and Sexual Activity   Alcohol use: Yes    Alcohol/week: 1.0 - 2.0 standard drink of alcohol    Types: 1 - 2 Standard drinks or equivalent per week    Comment: Very rarely drink cocktail   Drug use: Not Currently    Types: Marijuana    Comment: Used to smoke in college   Sexual activity: Yes     Birth control/protection: I.U.D.    Comment: 1 partner - my husband  Other Topics Concern   Not on file  Social History Narrative   Not on file   Social Determinants of Health   Financial Resource Strain: Not on file  Food Insecurity: Not on file  Transportation Needs: Not on file  Physical Activity: Not on file  Stress: Not on file  Social Connections: Not on file  Intimate Partner Violence: Not on file  Objective:  Physical Exam: BP 124/82 (BP Location: Left Arm, Patient Position: Sitting, Cuff Size: Large)   Pulse 90   Temp (!) 97.5 F (36.4 C) (Temporal)   Wt 230 lb 3.2 oz (104.4 kg)   SpO2 99%   BMI 35.00 kg/m    General: No acute distress. Awake and conversant.  Eyes: Normal conjunctiva, anicteric. Round symmetric pupils.  ENT: Hearing grossly intact. No nasal discharge.  Neck: Neck is supple. No masses or thyromegaly.  Respiratory: Respirations are non-labored. No auditory wheezing.  Skin: Ventral aspect of distal left index finger, mildly tender on exam, can see injection site which appears well-healed, no cystic collection, no erythema no drainage Psych: Alert and oriented. Cooperative, Appropriate mood and affect, Normal judgment.  CV: No cyanosis or JVD MSK: Normal ambulation. No clubbing  Neuro: Sensation and CN II-XII grossly normal.        Garner Nash, MD, MS

## 2022-01-01 NOTE — Assessment & Plan Note (Signed)
Left index finger Did discuss risk of blood-borne pathogens, patient declined testing at this time Mildly tender to palpation, no obvious or gross signs of infection Given intermittent tenderness and swelling, can trial short course of antibiotics if there is an occult infection If improved with antibiotics, can consider more advanced imaging including ultrasound versus MRI If severe, patient follow-up regarding which department

## 2022-01-16 ENCOUNTER — Encounter: Payer: Self-pay | Admitting: Family Medicine

## 2022-01-19 ENCOUNTER — Ambulatory Visit (INDEPENDENT_AMBULATORY_CARE_PROVIDER_SITE_OTHER): Payer: 59 | Admitting: Family Medicine

## 2022-01-19 ENCOUNTER — Encounter: Payer: Self-pay | Admitting: Family Medicine

## 2022-01-19 VITALS — BP 122/74 | HR 74 | Temp 98.4°F | Wt 234.2 lb

## 2022-01-19 DIAGNOSIS — W460XXD Contact with hypodermic needle, subsequent encounter: Secondary | ICD-10-CM

## 2022-01-19 DIAGNOSIS — L089 Local infection of the skin and subcutaneous tissue, unspecified: Secondary | ICD-10-CM

## 2022-01-19 DIAGNOSIS — S60451D Superficial foreign body of left index finger, subsequent encounter: Secondary | ICD-10-CM

## 2022-01-19 MED ORDER — DOXYCYCLINE HYCLATE 100 MG PO TABS: 100.0000 mg | ORAL_TABLET | Freq: Two times a day (BID) | ORAL | 0 refills | Status: AC

## 2022-01-19 NOTE — Progress Notes (Signed)
Assessment/Plan:   Problem List Items Addressed This Visit       Other   Needle stick, hypodermic, accidental, initial encounter   Relevant Medications   doxycycline (VIBRA-TABS) 100 MG tablet   Foreign body of left index finger with infection - Primary   Relevant Medications   doxycycline (VIBRA-TABS) 100 MG tablet   Other Relevant Orders   DG Finger Index Left   Current pain and blister ruptured at site of needle injury, concern for possible foreign body, although none seen today, treated with course of Keflex AST reduction, concern for possible underlying staph infection     Subjective:  HPI:  Shelby Dyer is a 26 y.o. 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   She presents with chief complaint of Follow-up (Left index finger follow up) .   Needlestick.   01/19/22 Patient presents today for follow-up on left index finger injury.  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  Patient had ongoing redness at that time and was treated with a course of Keflex.  Approximately 2 weeks ago patient reported that she had a blister erupt with serosanguineous fluid.  The blisters  ongoing, but there is no longer any drainage.  There is some mild tenderness and erythema over the site.  Patient denies any symptoms of fever, chills.  01/01/22 Patient had an accidental needlestick on her left index finger-2 weeks ago.  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  The needle penetrated through a nitrile glove.  The needle had not been injected into animal.  Since then, patient reports intermittent swelling tenderness, particularly worse in the morning.  It is not getting better or worse.  She has had no drainage, fevers, chills.      Past Surgical History:  Procedure Laterality Date   double scope      Tub place in both ear  Bilateral    When she was 26 years of age   59 EXTRACTION  2006   All 4    Outpatient Medications Prior to Visit  Medication Sig Dispense Refill   ALPRAZolam (XANAX XR) 0.5 MG 24 hr tablet Take 0.5 mg by mouth as needed for anxiety.     Ascorbic Acid (VITAMIN C PO) Take 500-2,000 mg by mouth daily in the afternoon.     cetirizine (ZYRTEC) 10 MG tablet Take 20 mg by mouth in the morning and at bedtime.     cromolyn (NASALCROM) 5.2 MG/ACT nasal spray 1-2 sprays as needed.     EPINEPHrine (PRIMATENE MIST) 0.125 MG/ACT AERO 1-2 puffs as needed.     EPINEPHrine 0.3 mg/0.3 mL IJ SOAJ injection Inject 0.3 mg into the muscle as needed for anaphylaxis. 1 each 3   famotidine (PEPCID) 40 MG tablet Take 40 mg by mouth 2 (two) times daily.     hydrOXYzine (ATARAX) 25 MG tablet Take 25-50 mg by  mouth every 8 (eight) hours as needed (allergies).     ketotifen (ZADITOR) 0.025 % ophthalmic solution 4 drops as needed. By mouth, up to 4 times a day     levonorgestrel (MIRENA) 20 MCG/24HR IUD 1 each by Intrauterine route once.     metoprolol tartrate (LOPRESSOR) 25 MG tablet Take 25 mg by mouth 2 (two) times daily.     Multiple Vitamin (MULTIVITAMIN) tablet Take 1 tablet by mouth daily.     Multiple Vitamins-Minerals (ZINC PO) Take by mouth.      nortriptyline (PAMELOR) 25 MG capsule Take 75 mg by mouth daily after supper.     ondansetron (ZOFRAN) 4 MG tablet Take 1 tablet (4 mg total) by mouth every 6 (six) hours as needed for nausea or vomiting. 12 tablet 0   Polyethylene Glycol 3350 (MIRALAX PO) Take by mouth.     Prucalopride Succinate (MOTEGRITY) 2 MG TABS Take 1 tablet (2 mg total) by mouth daily. 60 tablet 3   QUERCETIN PO Take 500-2,000 mg by mouth daily in the afternoon.     Riboflavin 100 MG TABS Take 200 mg by mouth daily in the afternoon.     dicyclomine (BENTYL) 10 MG capsule Take 1 capsule (10 mg total) by mouth every 6 (six) hours as needed for spasms (abdominal pain, cramping). (Patient not taking: Reported on 01/01/2022) 90 capsule 0   No facility-administered medications prior to visit.    Family History  Problem Relation Age of Onset   Anxiety disorder Mother    Depression Mother    Atrial fibrillation Father    Other Sister    Hypertension Brother    Early death Maternal Aunt    Kidney disease Maternal Grandfather    Stroke Maternal Grandfather    Breast cancer Paternal Grandmother    Cancer Paternal Grandmother    Varicose Veins Paternal Grandmother    Cancer Paternal Grandfather    Anxiety disorder Sister    Depression Sister    Learning disabilities Sister    Asthma Brother    Learning disabilities Brother    Depression Brother    Hypertension Brother    Learning disabilities Brother    Obesity Brother    Colon cancer Neg Hx    Stomach cancer Neg Hx    Liver cancer Neg Hx    Pancreatic cancer Neg Hx     Social History   Socioeconomic History   Marital status: Married    Spouse name: Sport and exercise psychologist   Number of children: Not on file   Years of education: Not on file   Highest education level: Not on file  Occupational History   Occupation: Camera operator    Comment: [employer]  Tobacco Use   Smoking status: Former    Packs/day: 0.50    Years: 2.00    Total pack years: 1.00     Types: Cigarettes    Quit date: 03/26/2016    Years since quitting: 5.8   Smokeless tobacco: Never  Vaping Use   Vaping Use: Never used  Substance and Sexual Activity   Alcohol use: Yes    Alcohol/week: 1.0 - 2.0 standard drink of alcohol    Types: 1 - 2 Standard drinks or equivalent per week    Comment: Very rarely drink cocktail   Drug use: Not Currently    Types: Marijuana    Comment: Used to smoke in college   Sexual activity: Yes    Birth control/protection: I.U.D.    Comment:  1 partner - my husband  Other Topics Concern   Not on file  Social History Narrative   Not on file   Social Determinants of Health   Financial Resource Strain: Not on file  Food Insecurity: Not on file  Transportation Needs: Not on file  Physical Activity: Not on file  Stress: Not on file  Social Connections: Not on file  Intimate Partner Violence: Not on file                                                                                                 Objective:  Physical Exam: BP 122/74 (BP Location: Left Arm, Patient Position: Sitting, Cuff Size: Large)   Pulse 74   Temp 98.4 F (36.9 C) (Temporal)   Wt 234 lb 3.2 oz (106.2 kg)   SpO2 98%   BMI 35.61 kg/m    General: No acute distress. Awake and conversant.  Eyes: Normal conjunctiva, anicteric. Round symmetric pupils.  ENT: Hearing grossly intact. No nasal discharge.  Neck: Neck is supple. No masses or thyromegaly.  Respiratory: Respirations are non-labored. No auditory wheezing.  Skin: Ventral aspect of distal left index finger, there is a ruptured blister, there is no drainage, there is mild surrounding erythema Psych: Alert and oriented. Cooperative, Appropriate mood and affect, Normal judgment.  CV: No cyanosis or JVD MSK: Normal ambulation. No clubbing  Neuro: Sensation and CN II-XII grossly normal.        Alesia Banda, MD, MS

## 2022-01-30 ENCOUNTER — Telehealth: Payer: Self-pay

## 2022-01-30 NOTE — Telephone Encounter (Signed)
Called pt twice to confirm appointment for tomorrow.  Left message for pt to call and confirm appointment by today.

## 2022-01-30 NOTE — Telephone Encounter (Signed)
Called pt to confirm appointment for tomorrow. Left message for pt to call and confirm appointment by today 

## 2022-01-31 ENCOUNTER — Encounter: Payer: 59 | Admitting: Family Medicine

## 2022-02-26 ENCOUNTER — Ambulatory Visit (INDEPENDENT_AMBULATORY_CARE_PROVIDER_SITE_OTHER): Payer: 59 | Admitting: Family Medicine

## 2022-02-26 ENCOUNTER — Encounter: Payer: Self-pay | Admitting: Family Medicine

## 2022-02-26 VITALS — BP 126/82 | HR 79 | Temp 97.8°F | Wt 235.0 lb

## 2022-02-26 DIAGNOSIS — I951 Orthostatic hypotension: Secondary | ICD-10-CM

## 2022-02-26 DIAGNOSIS — D8940 Mast cell activation, unspecified: Secondary | ICD-10-CM

## 2022-02-26 NOTE — Patient Instructions (Signed)
We have completed your paperwork as requested

## 2022-02-26 NOTE — Progress Notes (Signed)
Assessment/Plan:   Problem List Items Addressed This Visit       Cardiovascular and Mediastinum   Dysautonomia orthostatic hypotension syndrome     Other   Mast cell activation ([facility]) - Primary   Form completed for patient and provided same day.  Handed off to be scanned to chart.   Subjective:  HPI:  Shelby Dyer is a 26 y.o. female who has Hypermobility syndrome; Dysautonomia orthostatic hypotension syndrome; Mast cell activation ([facility]); Intractable periodic headache syndrome; Inappropriate sinus tachycardia; Median nerve neuritis, right; Idiopathic hypersomnolence; IBS (irritable bowel syndrome); Hip pain; Chronic idiopathic constipation; Ehlers-Danlos syndrome; PTSD (post-traumatic stress disorder); Patellofemoral pain syndrome of both knees; Myofascial pain syndrome, cervical; Needle stick, hypodermic, accidental, initial encounter; and Foreign body of left index finger with infection on their problem list..   She  has a past medical history of Allergy (2000), Anemia (2020), Anxiety, Asthma (Early 2021), Depression (2012?), Mast cell activation syndrome ([facility]), Myofascial pain syndrome, cervical (11/14/2021), and Ulcer (2018)..   She presents with chief complaint of ADA paper work (Paper work for work accommodations ) .   Patient presents here to discuss work accommodations associated with her ongoing medical conditions including Mast Cell Degranulation and POTS syndrome.  She works for [employer].  Patient uses a cane at work when she walks distances greater than 5 minutes to help with ambulation and balance.  Especially on days when she has mast cell flare or POTS episode.  Patient also requires occasional breaks when doing strenuous activity, but typically recovers within a few minutes.  She currently gets accommodations to use her cane at work however this expires at the end of the year and she needs recertification.  For her patient also uses therapy dog to  assist with guidance.  However work would not allow her to use her therapy dog.   Past Surgical History:  Procedure Laterality Date   double scope      Tub place in both ear  Bilateral    When she was 26 years of age   WISDOM TOOTH EXTRACTION  2006   All 4    Outpatient Medications Prior to Visit  Medication Sig Dispense Refill   ALPRAZolam (XANAX XR) 0.5 MG 24 hr tablet Take 0.5 mg by mouth as needed for anxiety.     Ascorbic Acid (VITAMIN C PO) Take 500-2,000 mg by mouth daily in the afternoon.     cetirizine (ZYRTEC) 10 MG tablet Take 20 mg by mouth in the morning and at bedtime.     cromolyn (GASTROCROM) 100 MG/5ML solution Take by mouth.     cromolyn (NASALCROM) 5.2 MG/ACT nasal spray 1-2 sprays as needed.     EPINEPHrine (PRIMATENE MIST) 0.125 MG/ACT AERO 1-2 puffs as needed.     EPINEPHrine 0.3 mg/0.3 mL IJ SOAJ injection Inject 0.3 mg into the muscle as needed for anaphylaxis. 1 each 3   famotidine (PEPCID) 40 MG tablet Take 40 mg by mouth 2 (two) times daily.     hydrOXYzine (ATARAX) 25 MG tablet Take 25-50 mg by mouth every 8 (eight) hours as needed (allergies).     KETOTIFEN FUMARATE OP 1 mg by Miscellaneous route two (2) times a day. Start with 1mg  PO every day x 1 week, then may increase to 1mg  PO BID     levonorgestrel (MIRENA) 20 MCG/24HR IUD 1 each by Intrauterine route once.     metoprolol tartrate (LOPRESSOR) 25 MG tablet Take 25 mg by mouth  2 (two) times daily.     Multiple Vitamin (MULTIVITAMIN) tablet Take 1 tablet by mouth daily.     Multiple Vitamins-Minerals (ZINC PO) Take by mouth.     nortriptyline (PAMELOR) 25 MG capsule Take 75 mg by mouth daily after supper.     ondansetron (ZOFRAN) 4 MG tablet Take 1 tablet (4 mg total) by mouth every 6 (six) hours as needed for nausea or vomiting. 12 tablet 0   Polyethylene Glycol 3350 (MIRALAX PO) Take by mouth.     Prucalopride Succinate (MOTEGRITY) 2 MG TABS Take 1 tablet (2 mg total) by mouth daily. 60 tablet 3    QUERCETIN PO Take 500-2,000 mg by mouth daily in the afternoon.     Riboflavin 100 MG TABS Take 200 mg by mouth daily in the afternoon.     dicyclomine (BENTYL) 10 MG capsule Take 1 capsule (10 mg total) by mouth every 6 (six) hours as needed for spasms (abdominal pain, cramping). (Patient not taking: Reported on 01/01/2022) 90 capsule 0   ketotifen (ZADITOR) 0.025 % ophthalmic solution 4 drops as needed. By mouth, up to 4 times a day (Patient not taking: Reported on 02/26/2022)     No facility-administered medications prior to visit.    Family History  Problem Relation Age of Onset   Anxiety disorder Mother    Depression Mother    Atrial fibrillation Father    Other Sister    Hypertension Brother    Early death Maternal Aunt    Kidney disease Maternal Grandfather    Stroke Maternal Grandfather    Breast cancer Paternal Grandmother    Cancer Paternal Grandmother    Varicose Veins Paternal Grandmother    Cancer Paternal Grandfather    Anxiety disorder Sister    Depression Sister    Learning disabilities Sister    Asthma Brother    Learning disabilities Brother    Depression Brother    Hypertension Brother    Learning disabilities Brother    Obesity Brother    Colon cancer Neg Hx    Stomach cancer Neg Hx    Liver cancer Neg Hx    Pancreatic cancer Neg Hx     Social History   Socioeconomic History   Marital status: Married    Spouse name: Paediatric nurse   Number of children: Not on file   Years of education: Not on file   Highest education level: Not on file  Occupational History   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    Comment: 1 partner - my husband  Other Topics Concern   Not on file  Social History Narrative   Not on file   Social Determinants of Health   Financial  Resource Strain: Not on file  Food Insecurity: Not on file  Transportation Needs: Not on file  Physical Activity: Not on file  Stress: Not on file  Social Connections: Not on file  Intimate Partner Violence: Not on file                                                                                                 Objective:  Physical Exam: BP 126/82 (BP Location: Left Arm, Patient Position: Sitting, Cuff Size: Large)   Pulse 79   Temp 97.8 F (36.6 C) (Temporal)   Wt 235 lb (106.6 kg)   SpO2 98%   BMI 35.73 kg/m   Therapy dog presentcarpal  General: No acute distress. Awake and conversant.  Eyes: Normal conjunctiva, anicteric. Round symmetric pupils.  ENT: Hearing grossly intact. No nasal discharge.  Neck: Neck is supple. No masses or thyromegaly.  Respiratory: Respirations are non-labored. No auditory wheezing.  Skin: Warm. No rashes or ulcers.  Psych: Alert and oriented. Cooperative, Appropriate mood and affect, Normal judgment.  CV: No cyanosis or JVD MSK:  No clubbing  Neuro: Sensation and CN II-XII grossly normal.        Garner Nash, MD, MS

## 2022-02-27 ENCOUNTER — Encounter: Payer: Self-pay | Admitting: Family Medicine

## 2022-04-09 LAB — RESULTS CONSOLE HPV: CHL HPV: NEGATIVE

## 2022-04-09 LAB — HM PAP SMEAR: HM Pap smear: NORMAL

## 2022-07-06 ENCOUNTER — Encounter: Payer: Self-pay | Admitting: Family Medicine

## 2022-07-09 ENCOUNTER — Encounter: Payer: Self-pay | Admitting: *Deleted

## 2022-07-12 ENCOUNTER — Encounter: Payer: Self-pay | Admitting: Family Medicine

## 2022-07-12 ENCOUNTER — Ambulatory Visit: Payer: 59 | Admitting: Family Medicine

## 2022-07-12 VITALS — BP 118/76 | HR 91 | Temp 97.4°F | Wt 240.6 lb

## 2022-07-12 DIAGNOSIS — M7918 Myalgia, other site: Secondary | ICD-10-CM

## 2022-07-12 DIAGNOSIS — M357 Hypermobility syndrome: Secondary | ICD-10-CM

## 2022-07-12 DIAGNOSIS — Q796 Ehlers-Danlos syndrome, unspecified: Secondary | ICD-10-CM | POA: Diagnosis not present

## 2022-07-12 NOTE — Progress Notes (Signed)
Assessment/Plan:   Problem List Items Addressed This Visit       Musculoskeletal and Integument   Hypermobility syndrome    Patient has a known diagnosis of Ehlers-Danlos Syndrome with hypermobility, and is experiencing related neck symptoms.  Plan:  Referral to orthopedics for advanced imaging  Continue physical therapy, focusing on stabilization exercises and pain management.      Relevant Orders   Ambulatory referral to Orthopedics   Ehlers-Danlos syndrome - Primary   Relevant Orders   Ambulatory referral to Orthopedics   Myofascial pain syndrome, cervical   Relevant Orders   Ambulatory referral to Orthopedics    There are no discontinued medications.  No follow-ups on file.    Subjective:   Encounter date: 07/12/2022  Shelby Dyer is a 27 y.o. female who has Hypermobility syndrome; Dysautonomia orthostatic hypotension syndrome; Mast cell activation; Intractable periodic headache syndrome; Inappropriate sinus tachycardia; Median nerve neuritis, right; Idiopathic hypersomnolence; IBS (irritable bowel syndrome); Hip pain; Chronic idiopathic constipation; Ehlers-Danlos syndrome; PTSD (post-traumatic stress disorder); Patellofemoral pain syndrome of both knees; Myofascial pain syndrome, cervical; Needle stick, hypodermic, accidental, initial encounter; and Foreign body of left index finger with infection on their problem list..   She  has a past medical history of Allergy (2000), Anemia (2020), Anxiety, Asthma (Early 2021), Depression (2012?), Mast cell activation syndrome, Myofascial pain syndrome, cervical (11/14/2021), and Ulcer (2018)..   CHIEF COMPLAINT: The patient, Shelby Levitan. Dyer, presents with persistent neck pain and concerns regarding her diagnosis of hypermobility syndrome.  HISTORY OF PRESENT ILLNESS:  Neck Pain. Patient reports a long-standing issue with neck pain. She recalls a previous neck X-ray from August of the prior year, which reportedly returned  normal results. However, symptoms persist, suggesting the possibility of underlying conditions not apparent on the initial X-ray. Comorbid conditions include Ehlers-Danlos Syndrome and POTS. The patient is currently using gabapentin 100 mg for small fiber neuropathy management, with good tolerance reported.  ROS: Neurologic: Patient experiences numbness and tingling in her hands and feet. Recently diagnosed with small fiber neuropathy. Musculoskeletal: Complaints of neck pain and established hypermobility syndrome. Cardiology: Reports dysautonomia and inappropriate sinus tachycardia, currently managed by cardiology at [facility]. Remainder of ROS negative.  Past Surgical History:  Procedure Laterality Date   double scope      Tub place in both ear  Bilateral    When she was 27 years of age   WISDOM TOOTH EXTRACTION  2006   All 4    Outpatient Medications Prior to Visit  Medication Sig Dispense Refill   ALPRAZolam (XANAX XR) 0.5 MG 24 hr tablet Take 0.5 mg by mouth as needed for anxiety.     Ascorbic Acid (VITAMIN C PO) Take 500-2,000 mg by mouth daily in the afternoon.     cetirizine (ZYRTEC) 10 MG tablet Take 20 mg by mouth in the morning and at bedtime.     cromolyn (GASTROCROM) 100 MG/5ML solution Take by mouth.     cromolyn (NASALCROM) 5.2 MG/ACT nasal spray 1-2 sprays as needed.     EPINEPHrine (PRIMATENE MIST) 0.125 MG/ACT AERO 1-2 puffs as needed.     EPINEPHrine 0.3 mg/0.3 mL IJ SOAJ injection Inject 0.3 mg into the muscle as needed for anaphylaxis. 1 each 3   famotidine (PEPCID) 40 MG tablet Take 40 mg by mouth 2 (two) times daily.     hydroxychloroquine (PLAQUENIL) 200 MG tablet Take 200 mg by mouth 2 (two) times daily.     hydrOXYzine (ATARAX) 25 MG  tablet Take 25-50 mg by mouth every 8 (eight) hours as needed (allergies).     KETOTIFEN FUMARATE OP 1 mg by Miscellaneous route two (2) times a day. Start with  PO every day x 1 week, then may increase to  PO BID      levonorgestrel (MIRENA) 20 MCG/24HR IUD 1 each by Intrauterine route once.     lubiprostone (AMITIZA) 8 MCG capsule Take by mouth.     Multiple Vitamin (MULTIVITAMIN) tablet Take 1 tablet by mouth daily.     Multiple Vitamins-Minerals (ZINC PO) Take by mouth.     nortriptyline (PAMELOR) 25 MG capsule Take 75 mg by mouth daily after supper.     Polyethylene Glycol 3350 (MIRALAX PO) Take by mouth.     QUERCETIN PO Take 500-2,000 mg by mouth daily in the afternoon.     Riboflavin 100 MG TABS Take 200 mg by mouth daily in the afternoon.     dicyclomine (BENTYL) 10 MG capsule Take 1 capsule (10 mg total) by mouth every 6 (six) hours as needed for spasms (abdominal pain, cramping). (Patient not taking: Reported on 01/01/2022) 90 capsule 0   metoprolol tartrate (LOPRESSOR) 25 MG tablet Take 25 mg by mouth 2 (two) times daily.     ondansetron (ZOFRAN) 4 MG tablet Take 1 tablet (4 mg total) by mouth every 6 (six) hours as needed for nausea or vomiting. (Patient not taking: Reported on 07/12/2022) 12 tablet 0   Prucalopride Succinate (MOTEGRITY) 2 MG TABS Take 1 tablet (2 mg total) by mouth daily. 60 tablet 3   No facility-administered medications prior to visit.    Family History  Problem Relation Age of Onset   Anxiety disorder Mother    Depression Mother    Atrial fibrillation Father    Other Sister    Hypertension Brother    Early death Maternal Aunt    Kidney disease Maternal Grandfather    Stroke Maternal Grandfather    Breast cancer Paternal Grandmother    Cancer Paternal Grandmother    Varicose Veins Paternal Grandmother    Cancer Paternal Grandfather    Anxiety disorder Sister    Depression Sister    Learning disabilities Sister    Asthma Brother    Learning disabilities Brother    Depression Brother    Hypertension Brother    Learning disabilities Brother    Obesity Brother    Colon cancer Neg Hx    Stomach cancer Neg Hx    Liver cancer Neg Hx    Pancreatic cancer Neg Hx      Social History   Socioeconomic History   Marital status: Married    Spouse name: Paediatric nurse   Number of children: Not on file   Years of education: Not on file   Highest education level: Bachelor's degree (e.g., BA, AB, BS)  Occupational History   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  Tobacco Use   Smoking status: Former    Packs/day: 0.50    Years: 2.00    Additional pack years: 0.00    Total pack years: 1.00    Types: Cigarettes    Quit date: 03/26/2016    Years since quitting: 6.3   Smokeless tobacco: Never  Vaping Use   Vaping Use: Never used  Substance and Sexual Activity   Alcohol use: Yes    Alcohol/week: 1.0 - 2.0 standard drink of alcohol    Types: 1 - 2 Standard drinks or equivalent per  week    Comment: Very rarely drink cocktail   Drug use: Not Currently    Types: Marijuana    Comment: Used to smoke in college   Sexual activity: Yes    Birth control/protection: I.U.D.    Comment: 1 partner - my husband  Other Topics Concern   Not on file  Social History Narrative   Not on file   Social Determinants of Health   Financial Resource Strain: Low Risk  (07/09/2022)   Overall Financial Resource Strain (CARDIA)    Difficulty of Paying Living Expenses: Not very hard  Food Insecurity: No Food Insecurity (07/09/2022)   Hunger Vital Sign    Worried About Running Out of Food in the Last Year: Never true    Ran Out of Food in the Last Year: Never true  Transportation Needs: No Transportation Needs (07/09/2022)   PRAPARE - Administrator, Civil Service (Medical): No    Lack of Transportation (Non-Medical): No  Physical Activity: Sufficiently Active (07/09/2022)   Exercise Vital Sign    Days of Exercise per Week: 4 days    Minutes of Exercise per Session: 60 min  Stress: Stress Concern Present (07/09/2022)   [employer] of Occupational Health - Occupational Stress Questionnaire    Feeling of Stress : To some extent  Social  Connections: Socially Integrated (07/09/2022)   Social Connection and Isolation Panel [NHANES]    Frequency of Communication with Friends and Family: More than three times a week    Frequency of Social Gatherings with Friends and Family: Once a week    Attends Religious Services: More than 4 times per year    Active Member of [employer] or Organizations: Yes    Attends Engineer, structural: More than 4 times per year    Marital Status: Married  Catering manager Violence: Not on file                                                                                                  Objective:  Physical Exam: BP 118/76 (BP Location: Left Arm, Patient Position: Sitting, Cuff Size: Large)   Pulse 91   Temp (!) 97.4 F (36.3 C) (Temporal)   Wt 240 lb 9.6 oz (109.1 kg)   SpO2 97%   BMI 36.58 kg/m     Physical Exam Constitutional:      Appearance: Normal appearance.  HENT:     Head: Normocephalic and atraumatic.     Right Ear: Hearing normal.     Left Ear: Hearing normal.     Nose: Nose normal.  Eyes:     General: No scleral icterus.       Right eye: No discharge.        Left eye: No discharge.     Extraocular Movements: Extraocular movements intact.  Cardiovascular:     Comments: No cyanosis, no JVD Pulmonary:     Effort: Pulmonary effort is normal.     Comments: No auditory wheezing Musculoskeletal:     Comments: No clubbing  Skin:    General: Skin is warm.  Findings: No rash.  Neurological:     General: No focal deficit present.     Mental Status: She is alert and oriented to person, place, and time.     Gait: Gait normal.  Psychiatric:        Mood and Affect: Mood normal.        Behavior: Behavior normal.        Thought Content: Thought content normal.        Judgment: Judgment normal.     No results found.  No results found for this or any previous visit (from the past 2160 hour(s)).     Narrative & Impression  CLINICAL DATA:  Neck pain radiating to  the shoulders.   EXAM: CERVICAL SPINE - 2-3 VIEW   COMPARISON:  None Available.   FINDINGS: There is no evidence of cervical spine fracture or prevertebral soft tissue swelling. Alignment is normal. No other significant bone abnormalities are identified. The vertebra and discs are normal in heights. Arthritic changes are not seen.   IMPRESSION: AP Lat cervical spine views with no significant radiographic findings.     Electronically Signed   By: Almira Bar M.D.   On: 11/16/2021 01:28   Garner Nash, MD, MS

## 2022-07-13 NOTE — Assessment & Plan Note (Signed)
Patient has a known diagnosis of Ehlers-Danlos Syndrome with hypermobility, and is experiencing related neck symptoms.  Plan:  Referral to orthopedics for advanced imaging  Continue physical therapy, focusing on stabilization exercises and pain management.

## 2022-10-08 ENCOUNTER — Ambulatory Visit: Payer: 59 | Admitting: Family Medicine

## 2022-10-27 IMAGING — DX DG ABDOMEN 1V
1 series · 1 of 1 positions shown · non-contrast
Comparison: None Available.

CLINICAL DATA: Chronic constipation.  Follow-up Sitz marker.

EXAM:
ABDOMEN - 1 VIEW

[abdomen kub]
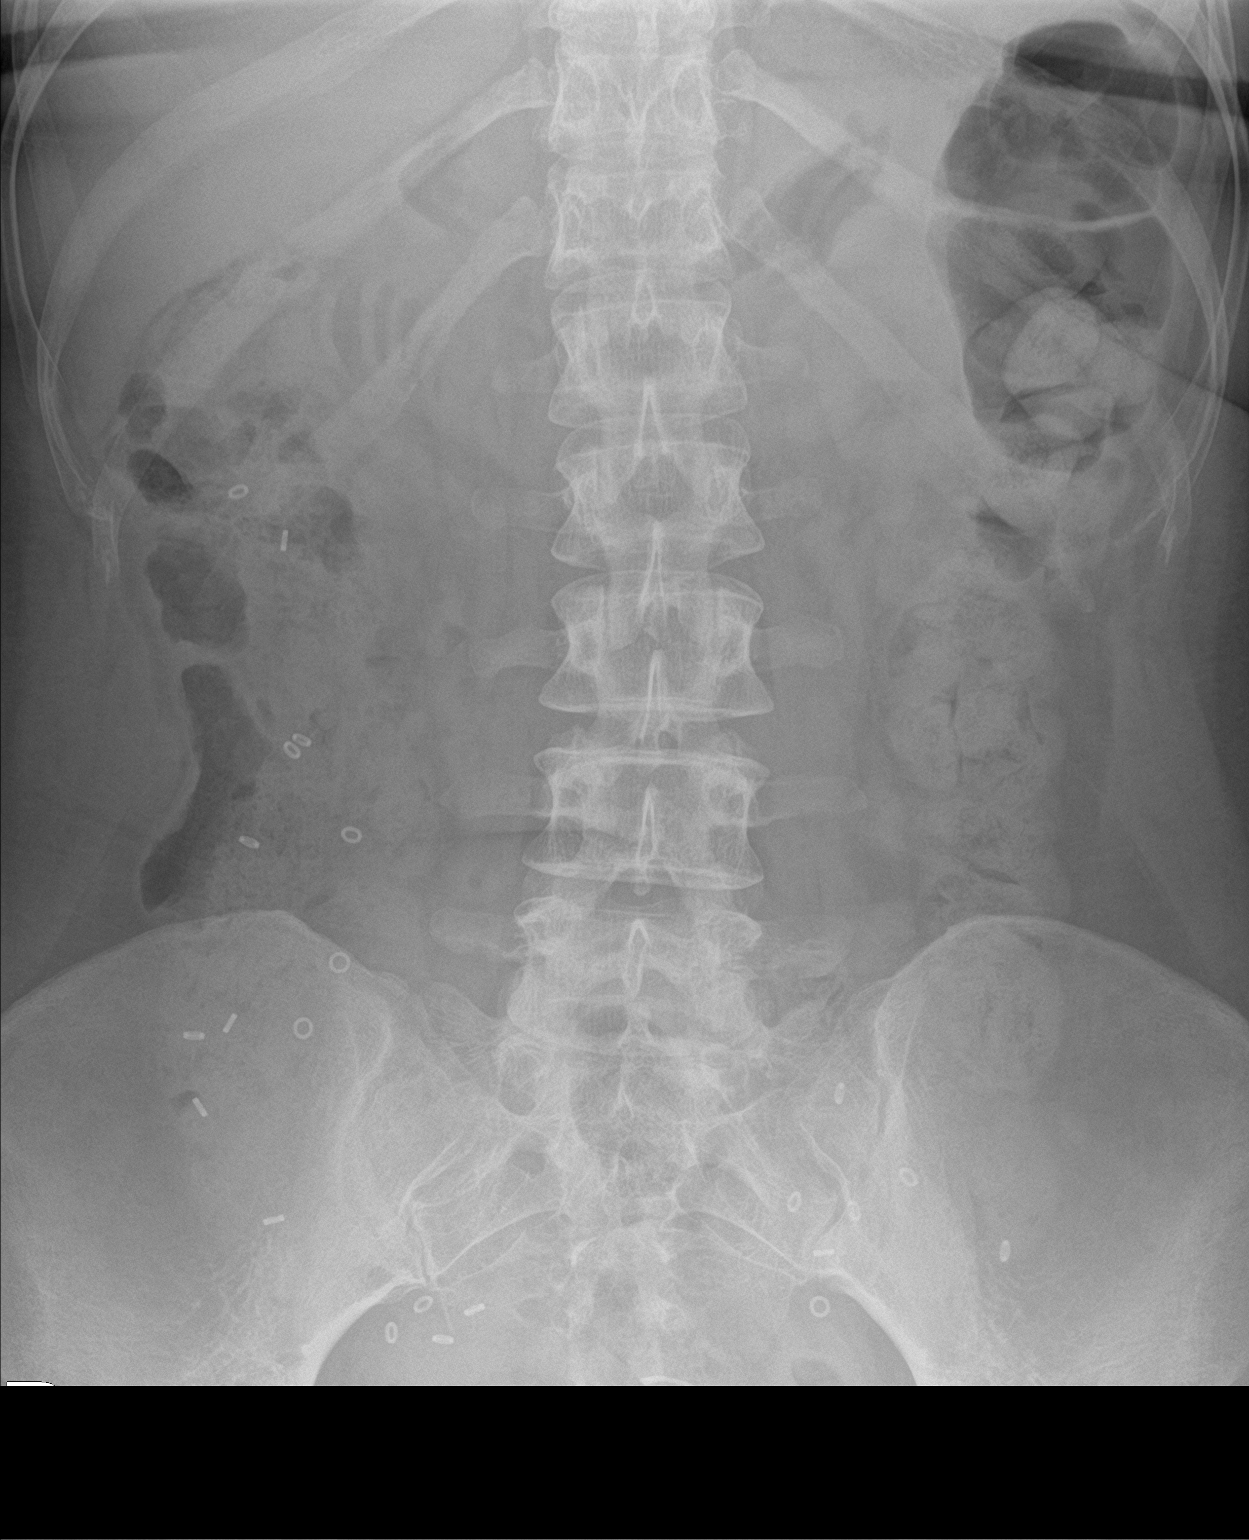

[1 of 1 positions shown; findings below may reference images not displayed]

FINDINGS: Stool throughout the colon compatible with constipation. No gaseous
distended loops of small bowel. Unremarkable osseous structures.
Intrauterine device. Multiple markers demonstrated projecting over
the expected location of the ascending colon. There are a few
markers projecting over the left lower quadrant which may
potentially be within the distal colon or overlying small bowel.
IMPRESSION: Multiple markers demonstrated projecting over the expected location
of the ascending colon. There are a few markers projecting over the
left lower quadrant which may potentially be within the distal colon
or overlying small bowel.

## 2022-10-30 ENCOUNTER — Encounter: Payer: Self-pay | Admitting: Family Medicine

## 2022-10-30 ENCOUNTER — Ambulatory Visit: Payer: 59 | Admitting: Family Medicine

## 2022-10-30 VITALS — BP 120/82 | HR 72 | Temp 97.8°F | Wt 242.0 lb

## 2022-10-30 DIAGNOSIS — E669 Obesity, unspecified: Secondary | ICD-10-CM | POA: Diagnosis not present

## 2022-10-30 DIAGNOSIS — M2550 Pain in unspecified joint: Secondary | ICD-10-CM

## 2022-10-30 DIAGNOSIS — G8929 Other chronic pain: Secondary | ICD-10-CM | POA: Diagnosis not present

## 2022-10-30 NOTE — Progress Notes (Signed)
Assessment/Plan:   Problem List Items Addressed This Visit       Other   Chronic pain of multiple joints - Primary    Chronic multi-joint pain without current effective management. Differential diagnosis: Neuropathic pain Chronic Lyme disease-associated pain EDS-related pain  Plan: Lyme panel w/ reflex Referral to pain management specialist. Consider trial of duloxetine for neuropathic pain. Patient can discuss alternative management options with integrative medicine including explore low-dose naltrexone, discussing criteria and effectiveness further. Consider targeted joint injection therapy or nerve ablation. Suggest TENS unit,  Further discussion on capsaicin topical treatments.      Relevant Orders   Lyme Disease Serology w/Reflex   Ambulatory referral to Pain Clinic   Class 2 obesity in adult    Weight reduction may help improve pain Discus weight management strategies such as topiramate (explore further with Neurology for migraine prophylaxis) vs. Medial vs. Surgical weight management.  Consider referral to bariatric medicine in the future       There are no discontinued medications.  No follow-ups on file.    Subjective:   Encounter date: 10/30/2022  Shelby Dyer is a 27 y.o. female who has Hypermobility syndrome; Dysautonomia orthostatic hypotension syndrome; Mast cell activation (HCC); Intractable periodic headache syndrome; Inappropriate sinus tachycardia; Median nerve neuritis, right; Idiopathic hypersomnolence; IBS (irritable bowel syndrome); Hip pain; Chronic idiopathic constipation; Ehlers-Danlos syndrome; PTSD (post-traumatic stress disorder); Patellofemoral pain syndrome of both knees; Myofascial pain syndrome, cervical; Needle stick, hypodermic, accidental, initial encounter; Foreign body of left index finger with infection; Chronic pain of multiple joints; and Class 2 obesity in adult on their problem list..   She  has a past medical history of  Allergy (2000), Anemia (2020), Anxiety, Asthma (Early 2021), Depression (2012?), Mast cell activation syndrome (HCC), Myofascial pain syndrome, cervical (11/14/2021), and Ulcer (2018)..   She presents with chief complaint of Medical Management of Chronic Issues (Discuss pros/cons of referral to chronic pain specialist. Is it worth it. Chronic lymes disease discussion. )  Chief Complaint: Discussion of pain management concerns and chronic Lyme disease.  History of Present Illness: Chronic Pain Management: The patient, Shelby Dyer, reports experiencing chronic pain that she has difficulty quantifying but has been persistent enough that she does not remember life without it. She has tried gabapentin and Tylenol, both of which offer partial relief. Due to mast cell issues, NSAIDs are contraindicated. Nortriptyline was tried but discontinued due to gastrointestinal side effects and contraindications from her GI doctor.  Chronic Lyme Disease Concerns: Shelby Dyer recalls a rash, identified as a bull's eye typical of Lyme disease, observed when she was a child but not treated at the time. With a history of tick bites and familial cases of Lyme and [facility] spotted fever, she is contemplating whether testing for Lyme disease might be beneficial. She experiences symptoms she believes could be attributed to chronic Lyme disease.  Review of Systems  Cardiovascular:  Positive for palpitations.  Gastrointestinal:  Positive for constipation and diarrhea.  Musculoskeletal:  Positive for joint pain (multiple joints).  Neurological:  Positive for tingling (Numbness and tingling in hands and feet. Diagnosed with small fiber neuropathy.) and headaches.    Past Surgical History:  Procedure Laterality Date   double scope      Tub place in both ear  Bilateral    When she was 27 years of age   WISDOM TOOTH EXTRACTION  2006   All 4    Outpatient Medications Prior to Visit  Medication Sig Dispense Refill  Ascorbic Acid (VITAMIN C PO) Take 500-2,000 mg by mouth daily in the afternoon.     atenolol (TENORMIN) 25 MG tablet Take by mouth daily. 25 mg in the morning and 12.5 mg mid day.     cetirizine (ZYRTEC) 10 MG tablet Take 20 mg by mouth in the morning and at bedtime.     cromolyn (NASALCROM) 5.2 MG/ACT nasal spray 1-2 sprays as needed.     EPINEPHrine (PRIMATENE MIST) 0.125 MG/ACT AERO 1-2 puffs as needed.     EPINEPHrine 0.3 mg/0.3 mL IJ SOAJ injection Inject 0.3 mg into the muscle as needed for anaphylaxis. 1 each 3   famotidine (PEPCID) 40 MG tablet Take 40 mg by mouth 2 (two) times daily.     hydroxychloroquine (PLAQUENIL) 200 MG tablet Take 200 mg by mouth 2 (two) times daily.     hydrOXYzine (ATARAX) 25 MG tablet Take 25-50 mg by mouth every 8 (eight) hours as needed (allergies).     KETOTIFEN FUMARATE OP 1 mg by Miscellaneous route two (2) times a day. Start with 1mg  PO every day x 1 week, then may increase to 1mg  PO BID     levonorgestrel (MIRENA) 20 MCG/24HR IUD 1 each by Intrauterine route once.     Multiple Vitamin (MULTIVITAMIN) tablet Take 1 tablet by mouth daily.     Multiple Vitamins-Minerals (ZINC PO) Take by mouth.     nortriptyline (PAMELOR) 25 MG capsule Take 75 mg by mouth daily after supper.     Polyethylene Glycol 3350 (MIRALAX PO) Take by mouth.     QUERCETIN PO Take 500-2,000 mg by mouth daily in the afternoon.     Riboflavin 100 MG TABS Take 200 mg by mouth daily in the afternoon.     ALPRAZolam (XANAX XR) 0.5 MG 24 hr tablet Take 0.5 mg by mouth as needed for anxiety. (Patient not taking: Reported on 10/30/2022)     dicyclomine (BENTYL) 10 MG capsule Take 1 capsule (10 mg total) by mouth every 6 (six) hours as needed for spasms (abdominal pain, cramping). (Patient not taking: Reported on 01/01/2022) 90 capsule 0   metoprolol tartrate (LOPRESSOR) 25 MG tablet Take 25 mg by mouth 2 (two) times daily.     ondansetron (ZOFRAN) 4 MG tablet Take 1 tablet (4 mg total) by mouth  every 6 (six) hours as needed for nausea or vomiting. (Patient not taking: Reported on 07/12/2022) 12 tablet 0   Prucalopride Succinate (MOTEGRITY) 2 MG TABS Take 1 tablet (2 mg total) by mouth daily. 60 tablet 3   No facility-administered medications prior to visit.    Family History  Problem Relation Age of Onset   Anxiety disorder Mother    Depression Mother    Atrial fibrillation Father    Other Sister    Hypertension Brother    Early death Maternal Aunt    Kidney disease Maternal Grandfather    Stroke Maternal Grandfather    Breast cancer Paternal Grandmother    Cancer Paternal Grandmother    Varicose Veins Paternal Grandmother    Cancer Paternal Grandfather    Anxiety disorder Sister    Depression Sister    Learning disabilities Sister    Asthma Brother    Learning disabilities Brother    Depression Brother    Hypertension Brother    Learning disabilities Brother    Obesity Brother    Colon cancer Neg Hx    Stomach cancer Neg Hx    Liver cancer Neg Hx  Pancreatic cancer Neg Hx     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  Tobacco Use   Smoking status: Former    Current packs/day: 0.00    Average packs/day: 0.5 packs/day for 2.0 years (1.0 ttl pk-yrs)    Types: Cigarettes    Start date: 03/26/2014    Quit date: 03/26/2016    Years since quitting: 6.6   Smokeless tobacco: Never  Vaping Use   Vaping status: Never Used  Substance and Sexual Activity   Alcohol use: Yes    Alcohol/week: 1.0 - 2.0 standard drink of alcohol    Types: 1 - 2 Standard drinks or equivalent per week    Comment: Very rarely drink cocktail   Drug use: Not Currently    Types: Marijuana    Comment: Used to smoke in college    Sexual activity: Yes    Birth control/protection: I.U.D.    Comment: 1 partner - my husband  Other Topics Concern   Not on file  Social History Narrative   Not on file   Social Determinants of Health   Financial Resource Strain: Low Risk  (07/09/2022)   Overall Financial Resource Strain (CARDIA)    Difficulty of Paying Living Expenses: Not very hard  Food Insecurity: No Food Insecurity (07/09/2022)   Hunger Vital Sign    Worried About Running Out of Food in the Last Year: Never true    Ran Out of Food in the Last Year: Never true  Transportation Needs: No Transportation Needs (07/09/2022)   PRAPARE - Administrator, Civil Service (Medical): No    Lack of Transportation (Non-Medical): No  Physical Activity: Sufficiently Active (07/09/2022)   Exercise Vital Sign    Days of Exercise per Week: 4 days    Minutes of Exercise per Session: 60 min  Stress: Stress Concern Present (07/09/2022)   [employer] of Occupational Health - Occupational Stress Questionnaire    Feeling of Stress : To some extent  Social Connections: Socially Integrated (07/09/2022)   Social Connection and Isolation Panel [NHANES]    Frequency of Communication with Friends and Family: More than three times a week    Frequency of Social Gatherings with Friends and Family: Once a week    Attends Religious Services: More than 4 times per year    Active Member of [employer] or Organizations: Yes    Attends Engineer, structural: More than 4 times per year    Marital Status: Married  Catering manager Violence: Not At Risk (02/14/2022)   Received from [employer], [facility]   HITS    Over the last 12 months how often did your partner physically hurt you?: 1    Over the last 12 months how often did your partner insult you or talk down to you?: 1    Over the last 12 months how often did your partner threaten you with physical harm?: 1    Over the last 12 months how often did your partner scream or  curse at you?: 1  Objective:  Physical Exam: BP 120/82 (BP Location: Left Arm, Patient Position: Sitting, Cuff Size: Large)   Pulse 72   Temp 97.8 F (36.6 C) (Temporal)   Wt 242 lb (109.8 kg)   SpO2 98%   BMI 36.80 kg/m     Physical Exam Constitutional:      General: She is not in acute distress.    Appearance: Normal appearance. She is not ill-appearing or toxic-appearing.  HENT:     Head: Normocephalic and atraumatic.     Nose: Nose normal. No congestion.  Eyes:     General: No scleral icterus.    Extraocular Movements: Extraocular movements intact.  Cardiovascular:     Rate and Rhythm: Normal rate and regular rhythm.     Pulses: Normal pulses.     Heart sounds: Normal heart sounds.  Pulmonary:     Effort: Pulmonary effort is normal. No respiratory distress.     Breath sounds: Normal breath sounds.  Abdominal:     General: Abdomen is flat. Bowel sounds are normal.     Palpations: Abdomen is soft.  Musculoskeletal:        General: Normal range of motion.  Lymphadenopathy:     Cervical: No cervical adenopathy.  Skin:    General: Skin is warm and dry.     Findings: No rash.  Neurological:     General: No focal deficit present.     Mental Status: She is alert and oriented to person, place, and time. Mental status is at baseline.  Psychiatric:        Mood and Affect: Mood normal.        Behavior: Behavior normal.        Thought Content: Thought content normal.        Judgment: Judgment normal.     No results found.  No results found for this or any previous visit (from the past 2160 hour(s)).      Garner Nash, MD, MS

## 2022-10-30 NOTE — Assessment & Plan Note (Addendum)
Chronic multi-joint pain without current effective management. Differential diagnosis: Neuropathic pain Chronic Lyme disease-associated pain EDS-related pain  Plan: Lyme panel w/ reflex Referral to pain management specialist. Consider trial of duloxetine for neuropathic pain. Patient can discuss alternative management options with integrative medicine including explore low-dose naltrexone, discussing criteria and effectiveness further. Consider targeted joint injection therapy or nerve ablation. Suggest TENS unit,  Further discussion on capsaicin topical treatments.

## 2022-10-30 NOTE — Assessment & Plan Note (Signed)
Weight reduction may help improve pain Discus weight management strategies such as topiramate (explore further with Neurology for migraine prophylaxis) vs. Medial vs. Surgical weight management.  Consider referral to bariatric medicine in the future

## 2022-11-20 ENCOUNTER — Ambulatory Visit: Payer: 59 | Admitting: Neurology

## 2022-11-20 ENCOUNTER — Other Ambulatory Visit: Payer: Self-pay | Admitting: Neurology

## 2022-11-20 ENCOUNTER — Encounter: Payer: Self-pay | Admitting: Neurology

## 2022-11-20 VITALS — BP 133/82 | HR 91 | Ht 69.0 in | Wt 248.0 lb

## 2022-11-20 DIAGNOSIS — G43711 Chronic migraine without aura, intractable, with status migrainosus: Secondary | ICD-10-CM

## 2022-11-20 DIAGNOSIS — G43101 Migraine with aura, not intractable, with status migrainosus: Secondary | ICD-10-CM

## 2022-11-20 MED ORDER — AJOVY 225 MG/1.5ML ~~LOC~~ SOAJ
225.0000 mg | SUBCUTANEOUS | 11 refills | Status: AC
Start: 2022-11-20 — End: ?

## 2022-11-20 MED ORDER — NURTEC 75 MG PO TBDP
75.0000 mg | ORAL_TABLET | Freq: Every day | ORAL | 0 refills | Status: DC | PRN
Start: 2022-11-20 — End: 2023-10-11

## 2022-11-20 MED ORDER — AJOVY 225 MG/1.5ML ~~LOC~~ SOAJ
225.0000 mg | SUBCUTANEOUS | Status: DC
Start: 2022-11-20 — End: 2022-11-22

## 2022-11-20 NOTE — Patient Instructions (Addendum)
- start Ajovy(insurance may like Emgality) as prevention. If you do really well may be able to decrease the nortriptuline. Other options include Emgality, [place], Vyepti, Nurtec(preventative or acute) Bernita Raisin - When you are feeling better < 8 migraine days a month  we can try ubrelvy or nurtec acutely -small fiber neuropathy? Had an abnormal sweat test. Have to evaluate at another appointment if needed - Nurtec samples to try  Rimegepant Disintegrating Tablets What is this medication? RIMEGEPANT (ri ME je pant) prevents and treats migraines. It works by blocking a substance in the body that causes migraines. This medicine may be used for other purposes; ask your health care provider or pharmacist if you have questions. COMMON BRAND NAME(S): NURTEC ODT What should I tell my care team before I take this medication? They need to know if you have any of these conditions: Kidney disease Liver disease An unusual or allergic reaction to rimegepant, other medications, foods, dyes, or preservatives Pregnant or trying to get pregnant Breast-feeding How should I use this medication? Take this medication by mouth. Take it as directed on the prescription label. Leave the tablet in the sealed pack until you are ready to take it. With dry hands, open the pack and gently remove the tablet. If the tablet breaks or crumbles, throw it away. Use a new tablet. Place the tablet in the mouth and allow it to dissolve. Then, swallow it. Do not cut, crush, or chew this medication. You do not need water to take this medication. Talk to your care team about the use of this medication in children. Special care may be needed. Overdosage: If you think you have taken too much of this medicine contact a poison control center or emergency room at once. NOTE: This medicine is only for you. Do not share this medicine with others. What if I miss a dose? This does not apply. This medication is not for regular use. What may  interact with this medication? Certain medications for fungal infections, such as fluconazole, itraconazole Rifampin This list may not describe all possible interactions. Give your health care provider a list of all the medicines, herbs, non-prescription drugs, or dietary supplements you use. Also tell them if you smoke, drink alcohol, or use illegal drugs. Some items may interact with your medicine. What should I watch for while using this medication? Visit your care team for regular checks on your progress. Tell your care team if your symptoms do not start to get better or if they get worse. What side effects may I notice from receiving this medication? Side effects that you should report to your care team as soon as possible: Allergic reactions--skin rash, itching, hives, swelling of the face, lips, tongue, or throat Side effects that usually do not require medical attention (report to your care team if they continue or are bothersome): Nausea Stomach pain This list may not describe all possible side effects. Call your doctor for medical advice about side effects. You may report side effects to FDA at 1-800-FDA-1088. Where should I keep my medication? Keep out of the reach of children and pets. Store at room temperature between 20 and 25 degrees C (68 and 77 degrees F). Get rid of any unused medication after the expiration date. To get rid of medications that are no longer needed or have expired: Take the medication to a medication take-back program. Check with your pharmacy or law enforcement to find a location. If you cannot return the medication, check the label or package  insert to see if the medication should be thrown out in the garbage or flushed down the toilet. If you are not sure, ask your care team. If it is safe to put it in the trash, take the medication out of the container. Mix the medication with cat litter, dirt, coffee grounds, or other unwanted substance. Seal the mixture in a  bag or container. Put it in the trash. NOTE: This sheet is a summary. It may not cover all possible information. If you have questions about this medicine, talk to your doctor, pharmacist, or health care provider.  2024 Elsevier/Gold Standard (2021-05-03 00:00:00)   [place]ell Barrier Injection What is this medication? FREMANEZUMAB (fre ma NEZ ue mab) prevents migraines. It works by blocking a substance in the body that causes migraines. It is a monoclonal antibody. This medicine may be used for other purposes; ask your health care provider or pharmacist if you have questions. COMMON BRAND NAME(S): AJOVY What should I tell my care team before I take this medication? They need to know if you have any of these conditions: An unusual or allergic reaction to fremanezumab, other medications, foods, dyes, or preservatives Pregnant or trying to get pregnant Breast-feeding How should I use this medication? This medication is injected under the skin. You will be taught how to prepare and give it. Take it as directed on the prescription label. Keep taking it unless your care team tells you to stop. It is important that you put your used needles and syringes in a special sharps container. Do not put them in a trash can. If you do not have a sharps container, call your pharmacist or care team to get one. Talk to your care team about the use of this medication in children. Special care may be needed. Overdosage: If you think you have taken too much of this medicine contact a poison control center or emergency room at once. NOTE: This medicine is only for you. Do not share this medicine with others. What if I miss a dose? If you miss a dose, take it as soon as you can. If it is almost time for your next dose, take only that dose. Do not take double or extra doses. What may interact with this medication? Interactions are not expected. This list may not describe all possible interactions. Give your health care  provider a list of all the medicines, herbs, non-prescription drugs, or dietary supplements you use. Also tell them if you smoke, drink alcohol, or use illegal drugs. Some items may interact with your medicine. What should I watch for while using this medication? Tell your care team if your symptoms do not start to get better or if they get worse. What side effects may I notice from receiving this medication? Side effects that you should report to your care team as soon as possible: Allergic reactions or angioedema--skin rash, itching or hives, swelling of the face, eyes, lips, tongue, arms, or legs, trouble swallowing or breathing Side effects that usually do not require medical attention (report to your care team if they continue or are bothersome): Pain, redness, or irritation at injection site This list may not describe all possible side effects. Call your doctor for medical advice about side effects. You may report side effects to FDA at 1-800-FDA-1088. Where should I keep my medication? Keep out of the reach of children and pets. Store in a refrigerator or at room temperature between 20 and 25 degrees C (68 and 77 degrees F). Refrigeration (preferred):  Store in the refrigerator. Do not freeze. Keep in the original container until you are ready to take it. Remove the dose from the carton about 30 minutes before it is time for you to use it. If the dose is not used, it may be stored in the original container at room temperature for 7 days. Get rid of any unused medication after the expiration date. Room Temperature: This medication may be stored at room temperature for up to 7 days. Keep it in the original container. Protect from light until time of use. If it is stored at room temperature, get rid of any unused medication after 7 days or after it expires, whichever is first. To get rid of medications that are no longer needed or have expired: Take the medication to a medication take-back program.  Check with your pharmacy or law enforcement to find a location. If you cannot return the medication, ask your pharmacist or care team how to get rid of this medication safely. NOTE: This sheet is a summary. It may not cover all possible information. If you have questions about this medicine, talk to your doctor, pharmacist, or health care provider.  2024 Elsevier/Gold Standard (2021-05-05 00:00:00)

## 2022-11-20 NOTE — Progress Notes (Signed)
[facility]    Provider:  Dr Lucia Gaskins Requesting Provider: Monia Pouch., MD Primary Care Provider:  Garnette Gunner, MD  CC:  migraines  HPI:  Shelby Dyer is a 27 y.o. female here as requested by Monia Pouch., MD for migraines.  I reviewed [facility] notes Dr. Hollice Espy: She has a past medical history of anxiety, PTSD, IBS, POTS/dysautonomia, mast cell activation, chronic constipation who was seen [facility] for worsening headaches and some concern for IIH due to a family history of it.  She usually has a service dog with her.  Last time she was seen was May 2020 for and her migraines were stable at about 5-8 a month but unfortunately she continues to struggle with several other medical issues, her GI remains concerned that nortriptyline is making her GI issues worse.  Her husband has a job and neurology and she moved towards the [facility] area.  CT of the head in 2022 showed no acute findings, other workup reported normal eye exam the same week she was seen.  Patient reports her migraines are pulsating pounding throbbing, nausea, photophobia and phonophobia, hurts to move, can be unilateral.  Lasts up to 24 hours. Started having migraines as long as she can remember. Starting worsening 12/2020, in the setting of stress and moving. Nortriptyline helps some but worried affecting her GI problems. She was having 30 migraine days a month lasting 1-2 days moderately severe to about 12 total migraine days currently on the nortriptyline and > 15 total headache days a month, no medication overuse, she may get static vision during of before the migraine, completely resolvable. Can be on either side and spread to the whole head. Unknown what is making them worse or triggering, laying down in a dark room helps.   Reviewed notes, labs and imaging from outside physicians, which showed:  From a thorough review of records, medications tried greater than 3 months that can be used in  migraine management include: Topamax with side effects, metoprolol ineffective, Relpax, Maxalt, Nurtec no change, Advil mixed results, magnesium, nortriptyline, aimovig contraindicated due to constipation.   CT heas 01/2021:  INDICATION:Headache  TECHNIQUE:  CT HEAD WO IV CONTRAST  FINDINGS:  No acute intracranial hemorrhage identified. No acute midline shift or acute mass effect. No abnormal extra-axial fluid collections are seen.   Ventricles, cisterns, and sulci are unremarkable.  Visualized orbits are unremarkable.   Visualized paranasal sinuses are clear.   Mastoid air cells are clear. No displaced or depressed calvarial fracture identified. Procedure Note  Lambert Keto, MD - 02/04/2021 Formatting of this note might be different from the original.   INDICATION:Headache  TECHNIQUE:  CT HEAD WO IV CONTRAST  FINDINGS:  No acute intracranial hemorrhage identified. No acute midline shift or acute mass effect. No abnormal extra-axial fluid collections are seen.   Ventricles, cisterns, and sulci are unremarkable.  Visualized orbits are unremarkable.   Visualized paranasal sinuses are clear.   Mastoid air cells are clear. No displaced or depressed calvarial fracture identified.    IMPRESSION: No acute intracranial abnormality identified.   Recent Results (from the past 2160 hour(s))  Lyme Disease Serology w/Reflex     Status: None   Collection Time: 10/30/22  2:04 PM  Result Value Ref Range   Lyme Total Antibody EIA Negative Negative    Comment: Lyme antibodies not detected. Reflex testing is not indicated. No laboratory evidence of infection with B. burgdorferi (Lyme disease). Negative results may occur in patients  recently infected (less than or equal to 14 days) with B. burgdorferi.  If recent infection is suspected, repeat testing on a new sample collected in 7 to 14 days is recommended.    Magnesium 2.0 -- Load older lab results  Sodium -- 139 Load  older lab results  Potassium -- 3.9 Load older lab results  Chloride -- 109 High    Load older lab results  CO2 -- 24.7 Load older lab results  BUN -- 6 Low    Load older lab results  Creatinine -- 0.69 Load older lab results  EST.GFR (MDRD) -- -- Load older lab results  Anion Gap -- 5 Load older lab results  BUN/Creatinine Ratio -- 9 Load older lab results  Glucose -- 94 Load older lab results  Calcium -- 9.1 Load older lab results  eGFR CKD-EPI (2021) Female -- >90   Albumin -- 3.8   Total Protein -- 6.5   Total Bilirubin -- 0.4   AST -- 13   ALT -- 7 Low      Alkaline Phosphatase -- 33 Low        CBC  [facility]06/29/2023  Component 09/21/2021       WBC 3.6 Load older lab results  RBC 3.92 Low    Load older lab results  HGB 11.4 Load older lab results  HCT 34.7 Load older lab results  MCV 88.6 Load older lab results  MCH 29.1 Load older lab results  MCHC 32.8 Load older lab results  RDW 13.3 Load older lab results  MPV 8.8 Load older lab results  Platelet 208 Load older lab results  nRBC 0     Review of Systems: Patient complains of symptoms per HPI as well as the following symptoms pots, gi probs, small fiber neuropathy. Pertinent negatives and positives per HPI. All others negative.   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  Tobacco Use   Smoking status: Former    Current packs/day: 0.00    Average packs/day: 0.5 packs/day for 2.0 years (1.0 ttl pk-yrs)    Types: Cigarettes    Start date: 03/26/2014    Quit date: 03/26/2016    Years since quitting: 6.6   Smokeless tobacco: Never  Vaping Use   Vaping status: Never Used  Substance and Sexual Activity   Alcohol use: Yes     Alcohol/week: 1.0 - 2.0 standard drink of alcohol    Types: 1 - 2 Standard drinks or equivalent per week    Comment: Very rarely drink cocktail   Drug use: Not Currently    Types: Marijuana    Comment: Used to smoke in college   Sexual activity: Yes    Birth control/protection: I.U.D.    Comment: 1 partner - my husband  Other Topics Concern   Not on file  Social History Narrative   Caffeine: none   Social Determinants of Health   Financial Resource Strain: Low Risk  (07/09/2022)   Overall Financial Resource Strain (CARDIA)    Difficulty of Paying Living Expenses: Not very hard  Food Insecurity: No Food Insecurity (07/09/2022)   Hunger Vital Sign    Worried About Programme researcher, broadcasting/film/video in  the Last Year: Never true    Ran Out of Food in the Last Year: Never true  Transportation Needs: No Transportation Needs (07/09/2022)   PRAPARE - Administrator, Civil Service (Medical): No    Lack of Transportation (Non-Medical): No  Physical Activity: Sufficiently Active (07/09/2022)   Exercise Vital Sign    Days of Exercise per Week: 4 days    Minutes of Exercise per Session: 60 min  Stress: Stress Concern Present (07/09/2022)   [employer] of Occupational Health - Occupational Stress Questionnaire    Feeling of Stress : To some extent  Social Connections: Socially Integrated (07/09/2022)   Social Connection and Isolation Panel [NHANES]    Frequency of Communication with Friends and Family: More than three times a week    Frequency of Social Gatherings with Friends and Family: Once a week    Attends Religious Services: More than 4 times per year    Active Member of [employer] or Organizations: Yes    Attends Engineer, structural: More than 4 times per year    Marital Status: Married  Catering manager Violence: Not At Risk (02/14/2022)   Received from [employer], [facility]   HITS    Over the last 12 months how often did your partner physically hurt you?: 1    Over  the last 12 months how often did your partner insult you or talk down to you?: 1    Over the last 12 months how often did your partner threaten you with physical harm?: 1    Over the last 12 months how often did your partner scream or curse at you?: 1    Family History  Problem Relation Age of Onset   Migraines Mother    Anxiety disorder Mother    Depression Mother    Ehlers-Danlos syndrome Mother    Atrial fibrillation Father    Other Sister    Anxiety disorder Sister    Depression Sister    Learning disabilities Sister    Other Sister        POTS   Hypertension Brother    Asthma Brother    Learning disabilities Brother    Depression Brother    Hypertension Brother    Learning disabilities Brother    Obesity Brother    Migraines Maternal Grandmother    Kidney disease Maternal Grandfather    Stroke Maternal Grandfather    Breast cancer Paternal Grandmother    Cancer Paternal Grandmother    Varicose Veins Paternal Grandmother    Cancer Paternal Grandfather    Early death Maternal Aunt    Colon cancer Neg Hx    Stomach cancer Neg Hx    Liver cancer Neg Hx    Pancreatic cancer Neg Hx     Past Medical History:  Diagnosis Date   Allergy 2000   Hay fever, mcas, eczema, etc   Anemia 2020   Off/on low hct and/or low hemoglobin   Anxiety    Asthma Early 2021   Chronic idiopathic constipation    Chronic migraine with aura    Depression 2012?   Hypermobile Ehlers-Danlos syndrome    Idiopathic hypersomnolence    Mast cell activation syndrome (HCC)    Myofascial pain syndrome, cervical 11/14/2021   Peripheral neuropathy    small fiber, based on sweat test   POTS (postural orthostatic tachycardia syndrome)    Ulcer 2018   Stomach ulcer    Patient Active Problem List   Diagnosis Date  Noted   Migraine with aura and with status migrainosus, not intractable 11/20/2022   Chronic pain of multiple joints 10/30/2022   Class 2 obesity in adult 10/30/2022   Foreign body of  left index finger with infection 01/19/2022   Needle stick, hypodermic, accidental, initial encounter 01/01/2022   Patellofemoral pain syndrome of both knees 11/14/2021   Myofascial pain syndrome, cervical 11/14/2021   Ehlers-Danlos syndrome 11/06/2021   PTSD (post-traumatic stress disorder) 11/06/2021   Chronic idiopathic constipation 10/10/2021   Hip pain 06/13/2021   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   Dysautonomia orthostatic hypotension syndrome 05/17/2021   Mast cell activation (HCC) 05/17/2021   Chronic migraine without aura, with intractable migraine, so stated, with status migrainosus 05/17/2021   IBS (irritable bowel syndrome) 04/03/2021   Median nerve neuritis, right 06/25/2019   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     Ascorbic Acid (VITAMIN C PO) Take 500-2,000 mg by mouth daily in the afternoon.     atenolol (TENORMIN) 25 MG tablet Take by mouth daily. 25 mg in the morning and 12.5 mg mid day.     cetirizine (ZYRTEC) 10 MG tablet Take 20 mg by mouth in the morning and at bedtime.     Coenzyme Q10 (COQ-10 PO) Take 400 mg by mouth daily at 6 (six) AM.     cromolyn (GASTROCROM) 100 MG/5ML solution Take 200-800 mg by mouth 4 (four) times daily.     cromolyn (NASALCROM) 5.2 MG/ACT nasal spray 1-2 sprays as needed.     EPINEPHrine (PRIMATENE MIST) 0.125 MG/ACT AERO 1-2 puffs as needed.     EPINEPHrine 0.3 mg/0.3 mL IJ SOAJ injection Inject 0.3 mg into the muscle as needed for anaphylaxis.     famotidine (PEPCID) 20 MG tablet Take 20 mg by mouth 2 (two) times daily.     Fremanezumab-vfrm  (AJOVY) 225 MG/1.5ML SOAJ Inject 225 mg into the skin every 30 (thirty) days.     Fremanezumab-vfrm (AJOVY) 225 MG/1.5ML SOAJ Inject 225 mg into the skin every 30 (thirty) days. Please use copay card: BIN#: [id number] PCN#: PDMI GROUP#: [id number] ID#: [id number] EXPIRES: 03/26/2023 1.5 mL 11   gabapentin (NEURONTIN) 100 MG capsule Take 100 mg by mouth 3 (three) times daily.     hydroxychloroquine (PLAQUENIL) 200 MG tablet Take 200 mg by mouth 2 (two) times daily.     hydrOXYzine (ATARAX) 25 MG tablet Take 25-50 mg by mouth every 8 (eight) hours as needed (allergies).     KETOTIFEN FUMARATE OP 1 mg by Miscellaneous route two (2) times a day. Start with 1mg  PO every day x 1 week, then may increase to 1mg  PO BID     levonorgestrel (MIRENA) 20 MCG/24HR IUD 1 each by Intrauterine route once.     lubiprostone (AMITIZA) 8 MCG capsule Take 8 mcg by mouth 2 (two) times daily with a meal.     Multiple Vitamin (MULTIVITAMIN) tablet Take 1 tablet by mouth daily.     Multiple Vitamins-Minerals (ZINC PO) Take by mouth.     nortriptyline (  PAMELOR) 25 MG capsule Take 75 mg by mouth daily after supper.     Omega-3 Fatty Acids (FISH OIL PO) Take by mouth daily at 6 (six) AM.     Polyethylene Glycol 3350 (MIRALAX PO) Take by mouth.     predniSONE (DELTASONE) 20 MG tablet Take by mouth.     QUERCETIN PO Take 3,000 mg by mouth daily in the afternoon.     Riboflavin 100 MG TABS Take 200 mg by mouth 2 (two) times daily.     Rimegepant Sulfate (NURTEC) 75 MG TBDP Take 1 tablet (75 mg total) by mouth daily as needed. For migraines. Take as close to onset of migraine as possible. One daily maximum. 6 tablet 0   TURMERIC CURCUMIN PO Take 1,000 mg by mouth daily at 6 (six) AM.     UNABLE TO FIND Take by mouth 2 (two) times daily. Med Name: MAGNESIUM COMPLEX  200 mg AM and 400 mg PM     UNABLE TO FIND Take 1,000 mg by mouth daily. Med Name: VITASSIUM brand salt capsules     XOLAIR 150 MG/ML prefilled syringe Inject 300 mg into  the skin every 14 (fourteen) days.     No current facility-administered medications for this visit.    Allergies as of 11/20/2022 - Review Complete 11/20/2022  Allergen Reaction Noted   Latex Anaphylaxis and Hives 06/16/2019   Nsaids  11/20/2022   Tilactase Nausea And Vomiting 09/21/2021    Vitals: BP 133/82 (BP Location: Right Arm, Patient Position: Sitting, Cuff Size: Large)   Pulse 91   Ht 5\' 9"  (1.753 m)   Wt 248 lb (112.5 kg)   BMI 36.62 kg/m  Last Weight:  Wt Readings from Last 1 Encounters:  11/20/22 248 lb (112.5 kg)   Last Height:   Ht Readings from Last 1 Encounters:  11/20/22 5\' 9"  (1.753 m)     Physical exam: Exam: Gen: NAD, conversant, well nourised, obese, well groomed                     CV: RRR, no MRG. No Carotid Bruits. No peripheral edema, warm, nontender Eyes: Conjunctivae clear without exudates or hemorrhage  Neuro: Detailed Neurologic Exam  Speech:    Speech is normal; fluent and spontaneous with normal comprehension.  Cognition:    The patient is oriented to person, place, and time;     recent and remote memory intact;     language fluent;     normal attention, concentration,     fund of knowledge Cranial Nerves:    The pupils are equal, round, and reactive to light. The fundi are normal and spontaneous venous pulsations are present. Visual fields are full to finger confrontation. Extraocular movements are intact. Trigeminal sensation is intact and the muscles of mastication are normal. The face is symmetric. The palate elevates in the midline. Hearing intact. Voice is normal. Shoulder shrug is normal. The tongue has normal motion without fasciculations.   Coordination: nml  Gait:    Heel-toe and tandem gait are normal.   Motor Observation:    No asymmetry, no atrophy, and no involuntary movements noted. Tone:    Normal muscle tone.    Posture:    Posture is normal. normal erect    Strength:    Strength is V/V in the upper and  lower limbs.      Sensation: intact to LT     Reflex Exam:  DTR's:    Deep tendon reflexes  in the upper and lower extremities are normal bilaterally.   Toes:    The toes are downgoing bilaterally.   Clonus:    Clonus is absent.    Assessment/Plan:  Patient with chronic migraines on nortriptyline with good results but still with 12 migraine days a month and > 15 total headache days a month  - start Ajovy(insurance may like Emgality) as prevention. If you do really well may be able to decrease the nortriptuline. Other options include Emgality, Qulipta, Vyepti, Nurtec(preventative or acute), Bernita Raisin - When you are feeling better < 8 migraine days a month  we can try ubrelvy or nurtec acutely - -small fiber neuropathy? Had an abnormal sweat test. Have to evaluate at another appointment if needed, 3 months f/u scheduled last patient of the day for more time to have, she states she has many neurological problems. She can mychart me a list to get ready for the appointment.  - Nurtec samples to try  Have to be off Ajovy for 6 months before pregnancy, she plans on no children and uses birth control    Meds ordered this encounter  Medications   Fremanezumab-vfrm (AJOVY) 225 MG/1.5ML SOAJ    Sig: Inject 225 mg into the skin every 30 (thirty) days.   Rimegepant Sulfate (NURTEC) 75 MG TBDP    Sig: Take 1 tablet (75 mg total) by mouth daily as needed. For migraines. Take as close to onset of migraine as possible. One daily maximum.    Dispense:  6 tablet    Refill:  0   Fremanezumab-vfrm (AJOVY) 225 MG/1.5ML SOAJ    Sig: Inject 225 mg into the skin every 30 (thirty) days. Please use copay card: BIN#: [id number] PCN#: PDMI GROUP#: [id number] ID#: [id number] EXPIRES: 03/26/2023    Dispense:  1.5 mL    Refill:  11    Please use copay card: BIN#: [id number] PCN#: Collie Siad GROUP#: [id number] ID#: [id number] EXPIRES: 03/26/2023    Cc: Monia Pouch., MD,  Garnette Gunner, MD  Naomie Dean, MD  [facility] [address] [place], [place] [address]  Phone [phone] Fax [phone]

## 2022-11-21 ENCOUNTER — Other Ambulatory Visit (HOSPITAL_COMMUNITY): Payer: Self-pay

## 2022-11-21 ENCOUNTER — Telehealth: Payer: Self-pay | Admitting: *Deleted

## 2022-11-21 NOTE — Telephone Encounter (Signed)
Yes, I always tell my patients this is a possibility. Call patient and if she agrees give her emgality one injection a month for 11 refills thanks

## 2022-11-21 NOTE — Telephone Encounter (Signed)
   Appears they prefer Emgality, Aimovig and Nurtec.

## 2022-11-21 NOTE — Telephone Encounter (Signed)
Please try PA for patient unless you can see that another CGRP is on formulary? Then let us know. Thank you!

## 2022-11-21 NOTE — Telephone Encounter (Signed)
PA request sent to PA team

## 2022-11-22 ENCOUNTER — Telehealth: Payer: Self-pay

## 2022-11-22 ENCOUNTER — Other Ambulatory Visit (HOSPITAL_COMMUNITY): Payer: Self-pay

## 2022-11-22 MED ORDER — EMGALITY 120 MG/ML ~~LOC~~ SOAJ: 120.0000 mg | SUBCUTANEOUS | 11 refills | Status: DC

## 2022-11-22 NOTE — Telephone Encounter (Signed)
Spoke with patient. She is amenable to switching to [employer]. She is aware it will be due 30 days after she received the Ajovy sample, then every 30 days after. Patient verbalized appreciation for the call.   Emgality 120 mg q30 days ordered.

## 2022-11-22 NOTE — Telephone Encounter (Signed)
Pharmacy Patient Advocate Encounter   Received notification from Physician's Office that prior authorization for Emgality 120MG /ML auto-injectors (migraine) is required/requested.   Insurance verification completed.   The patient is insured through [facility] .   Per test claim: PA required; PA submitted to [facility] via CoverMyMeds Key/confirmation #/EOC [id number] Status is pending

## 2022-11-22 NOTE — Telephone Encounter (Signed)
PA request has been Submitted. New Encounter created for follow up. For additional info see Pharmacy Prior Auth telephone encounter from 11/22/2022.  New PA for [employer]

## 2022-11-22 NOTE — Addendum Note (Signed)
Addended by: Bertram Savin on: 11/22/2022 10:30 AM   Modules accepted: Orders

## 2022-11-28 ENCOUNTER — Other Ambulatory Visit: Payer: Self-pay | Admitting: Nurse Practitioner

## 2022-11-28 ENCOUNTER — Ambulatory Visit
Admission: RE | Admit: 2022-11-28 | Discharge: 2022-11-28 | Disposition: A | Discharge status: Home / Self Care | Payer: 59 | Source: Ambulatory Visit | Attending: Nurse Practitioner | Admitting: Nurse Practitioner

## 2022-11-28 ENCOUNTER — Other Ambulatory Visit (HOSPITAL_COMMUNITY): Payer: Self-pay

## 2022-11-28 DIAGNOSIS — M25561 Pain in right knee: Secondary | ICD-10-CM

## 2022-11-28 DIAGNOSIS — M25511 Pain in right shoulder: Secondary | ICD-10-CM

## 2022-11-28 DIAGNOSIS — M542 Cervicalgia: Secondary | ICD-10-CM

## 2022-11-28 NOTE — Telephone Encounter (Signed)
Pharmacy Patient Advocate Encounter  Received notification from [facility] that Prior Authorization for Emgality 120MG /ML auto-injectors (migraine) has been APPROVED from 11/22/2022 to 11/22/2023. Ran test claim, Copay is $Unable to obtain due to Refill Too Soon-Next fill available on 12/15/2022. This test claim was processed through [facility]- copay amounts may vary at other pharmacies due to pharmacy/plan contracts, or as the patient moves through the different stages of their insurance plan.   PA #/Case ID/Reference #: PA Case ID #: [id number]

## 2022-12-10 ENCOUNTER — Ambulatory Visit: Payer: 59 | Admitting: Family Medicine

## 2022-12-10 ENCOUNTER — Encounter: Payer: Self-pay | Admitting: Family Medicine

## 2022-12-10 VITALS — BP 124/78 | HR 88 | Temp 98.0°F | Wt 236.2 lb

## 2022-12-10 DIAGNOSIS — K5904 Chronic idiopathic constipation: Secondary | ICD-10-CM | POA: Diagnosis not present

## 2022-12-10 DIAGNOSIS — K582 Mixed irritable bowel syndrome: Secondary | ICD-10-CM

## 2022-12-10 DIAGNOSIS — G8929 Other chronic pain: Secondary | ICD-10-CM

## 2022-12-10 DIAGNOSIS — R110 Nausea: Secondary | ICD-10-CM

## 2022-12-10 DIAGNOSIS — R634 Abnormal weight loss: Secondary | ICD-10-CM

## 2022-12-10 DIAGNOSIS — M2550 Pain in unspecified joint: Secondary | ICD-10-CM

## 2022-12-10 MED ORDER — HYOSCYAMINE SULFATE 0.125 MG PO TABS
0.1250 mg | ORAL_TABLET | ORAL | 0 refills | Status: DC | PRN
Start: 2022-12-10 — End: 2023-10-11

## 2022-12-10 MED ORDER — PEDIASURE PEPTIDE 1.0 CAL EN LIQD
1.0000 | Freq: Three times a day (TID) | ENTERAL | Status: DC | PRN
Start: 2022-12-10 — End: 2023-05-10

## 2022-12-10 MED ORDER — ONDANSETRON HCL 4 MG PO TABS: 4.0000 mg | ORAL_TABLET | Freq: Three times a day (TID) | ORAL | 0 refills | Status: AC | PRN

## 2022-12-10 NOTE — Progress Notes (Signed)
Assessment/Plan:   Problem List Items Addressed This Visit       Digestive   IBS (irritable bowel syndrome) - Primary    Referral to Dr. Regina Eck at [facility] for a second opinion and further evaluation. Suggested trial of nutritional supplements: over-the-counter amino acid or peptide-based shakes, e.g., Pediasure Peptide. Hyoscyamine (Levsin) 0.125 mg every 4 hours as needed for abdominal discomfort and cramping. Ondansetron 4 mg every 8 hours as needed for nausea Monitor nutritional status and hydration closely.       Relevant Medications   hyoscyamine (LEVSIN) 0.125 MG tablet   Nutritional Supplements (PEDIASURE PEPTIDE 1.0 CAL) LIQD   Other Relevant Orders   Ambulatory referral to Gastroenterology   Chronic idiopathic constipation   Relevant Medications   hyoscyamine (LEVSIN) 0.125 MG tablet   Nutritional Supplements (PEDIASURE PEPTIDE 1.0 CAL) LIQD   Other Relevant Orders   Ambulatory referral to Gastroenterology     Other   Chronic pain of multiple joints    Continue to follow with Pain Management  monitor for any changes in symptoms and report as needed.      Other Visit Diagnoses     Nausea       Relevant Medications   hyoscyamine (LEVSIN) 0.125 MG tablet   Nutritional Supplements (PEDIASURE PEPTIDE 1.0 CAL) LIQD   Other Relevant Orders   Ambulatory referral to Gastroenterology   Unintended weight loss       Relevant Medications   hyoscyamine (LEVSIN) 0.125 MG tablet   Nutritional Supplements (PEDIASURE PEPTIDE 1.0 CAL) LIQD   Other Relevant Orders   Ambulatory referral to Gastroenterology       There are no discontinued medications.  No follow-ups on file.    Subjective:   Encounter date: 12/10/2022  Shelby Dyer is a 27 y.o. female who has Hypermobility syndrome; Dysautonomia orthostatic hypotension syndrome; Mast cell activation (HCC); Chronic migraine without aura, with intractable migraine, so stated, with  status migrainosus; Inappropriate sinus tachycardia; Median nerve neuritis, right; Idiopathic hypersomnolence; IBS (irritable bowel syndrome); Hip pain; Chronic idiopathic constipation; Ehlers-Danlos syndrome; PTSD (post-traumatic stress disorder); Patellofemoral pain syndrome of both knees; Myofascial pain syndrome, cervical; Needle stick, hypodermic, accidental, initial encounter; Foreign body of left index finger with infection; Chronic pain of multiple joints; Class 2 obesity in adult; and Migraine with aura and with status migrainosus, not intractable on their problem list..   She  has a past medical history of Allergy (2000), Anemia (2020), Anxiety, Asthma (Early 2021), Chronic idiopathic constipation, Chronic migraine with aura, Depression (2012?), Hypermobile Ehlers-Danlos syndrome, Idiopathic hypersomnolence, Mast cell activation syndrome (HCC), Myofascial pain syndrome, cervical (11/14/2021), Peripheral neuropathy, POTS (postural orthostatic tachycardia syndrome), and Ulcer (2018)..   Chief Complaint: Follow-up for a second opinion regarding chronic gastrointestinal (GI) issues and chronic pain management.  History of Present Illness:  Chronic Pain. The patient was previously referred to Dr[facility] Pernell Dupre [facility] the [facility] for chronic pain management. She had a consultation and was sent for X-rays, which returned normal results indicating that her chronic pain is not due to arthritis.  IBS and Constipation. She has a long-standing history of gastrointestinal complications including IBS and idiopathic constipation. Recently, after a vacation where she deviated from her usual diet, she experienced a significant exacerbation of GI symptoms including upper abdominal pain, abdominal pain, bloating, nausea without vomiting upon returning home. For the past two weeks, she has been unable to tolerate solid foods and has sustained a weight loss of 12 pounds. She has  been able to consume fluids, but her daily  caloric intake is approximately 700 calories.  Previously seen by gastroenterology at [facility], she is wishing to seek a second opinion for abdominal pain.  She has been recommended by peers and her EDS support group and is asking for referral to Dr. Regina Eck at [facility].  Reports constipation improved with MiraLAX and Amitiza.  Denies hematochezia or melena, fever or chills.   Past Surgical History:  Procedure Laterality Date   adenoids removed     double scope      Tub place in both ear  Bilateral    When she was 27 years of age   WISDOM TOOTH EXTRACTION  2006   All 4    Outpatient Medications Prior to Visit  Medication Sig Dispense Refill   acetaminophen (TYLENOL) 500 MG tablet Take 1,000 mg by mouth as needed.     Ascorbic Acid (VITAMIN C PO) Take 500-2,000 mg by mouth daily in the afternoon.     atenolol (TENORMIN) 25 MG tablet Take by mouth daily. 25 mg in the morning and 12.5 mg mid day.     cetirizine (ZYRTEC) 10 MG tablet Take 20 mg by mouth in the morning and at bedtime.     Coenzyme Q10 (COQ-10 PO) Take 400 mg by mouth daily at 6 (six) AM.     cromolyn (GASTROCROM) 100 MG/5ML solution Take 200-800 mg by mouth 4 (four) times daily.     cromolyn (NASALCROM) 5.2 MG/ACT nasal spray 1-2 sprays as needed.     EPINEPHrine (PRIMATENE MIST) 0.125 MG/ACT AERO 1-2 puffs as needed.     EPINEPHrine 0.3 mg/0.3 mL IJ SOAJ injection Inject 0.3 mg into the muscle as needed for anaphylaxis.     famotidine (PEPCID) 20 MG tablet Take 20 mg by mouth 2 (two) times daily.     gabapentin (NEURONTIN) 100 MG capsule Take 100 mg by mouth 3 (three) times daily.     Galcanezumab-gnlm (EMGALITY) 120 MG/ML SOAJ Inject 120 mg into the skin every 30 (thirty) days. 1 mL 11   hydroxychloroquine (PLAQUENIL) 200 MG tablet Take 200 mg by mouth 2 (two) times daily.     hydrOXYzine (ATARAX) 25 MG tablet Take 25-50 mg by mouth every 8 (eight) hours as needed  (allergies).     KETOTIFEN FUMARATE OP 1 mg by Miscellaneous route two (2) times a day. Start with 1mg  PO every day x 1 week, then may increase to 1mg  PO BID     levonorgestrel (MIRENA) 20 MCG/24HR IUD 1 each by Intrauterine route once.     lubiprostone (AMITIZA) 8 MCG capsule Take 8 mcg by mouth 2 (two) times daily with a meal.     Multiple Vitamin (MULTIVITAMIN) tablet Take 1 tablet by mouth daily.     Multiple Vitamins-Minerals (ZINC PO) Take by mouth.     nortriptyline (PAMELOR) 25 MG capsule Take 75 mg by mouth daily after supper.     Omega-3 Fatty Acids (FISH OIL PO) Take by mouth daily at 6 (six) AM.     Polyethylene Glycol 3350 (MIRALAX PO) Take by mouth.     QUERCETIN PO Take 3,000 mg by mouth daily in the afternoon.     Riboflavin 100 MG TABS Take 200 mg by mouth 2 (two) times daily.     Rimegepant Sulfate (NURTEC) 75 MG TBDP Take 1 tablet (75 mg total) by mouth daily as needed. For migraines. Take as close to onset  of migraine as possible. One daily maximum. 6 tablet 0   TURMERIC CURCUMIN PO Take 1,000 mg by mouth daily at 6 (six) AM.     UNABLE TO FIND Take by mouth 2 (two) times daily. Med Name: MAGNESIUM COMPLEX  200 mg AM and 400 mg PM     UNABLE TO FIND Take 1,000 mg by mouth daily. Med Name: VITASSIUM brand salt capsules     XOLAIR 150 MG/ML prefilled syringe Inject 300 mg into the skin every 14 (fourteen) days.     No facility-administered medications prior to visit.    Family History  Problem Relation Age of Onset   Migraines Mother    Anxiety disorder Mother    Depression Mother    Ehlers-Danlos syndrome Mother    Atrial fibrillation Father    Other Sister    Anxiety disorder Sister    Depression Sister    Learning disabilities Sister    Other Sister        POTS   Hypertension Brother    Asthma Brother    Learning disabilities Brother    Depression Brother    Hypertension Brother    Learning disabilities Brother    Obesity Brother    Migraines Maternal  Grandmother    Kidney disease Maternal Grandfather    Stroke Maternal Grandfather    Breast cancer Paternal Grandmother    Cancer Paternal Grandmother    Varicose Veins Paternal Grandmother    Cancer Paternal Grandfather    Early death Maternal Aunt    Colon cancer Neg Hx    Stomach cancer Neg Hx    Liver cancer Neg Hx    Pancreatic cancer Neg Hx     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  Tobacco Use   Smoking status: Former    Current packs/day: 0.00    Average packs/day: 0.5 packs/day for 2.0 years (1.0 ttl pk-yrs)    Types: Cigarettes    Start date: 03/26/2014    Quit date: 03/26/2016    Years since quitting: 6.7   Smokeless tobacco: Never  Vaping Use   Vaping status: Never Used  Substance and Sexual Activity   Alcohol use: Yes    Alcohol/week: 1.0 - 2.0 standard drink of alcohol    Types: 1 - 2 Standard drinks or equivalent per week    Comment: Very rarely drink cocktail   Drug use: Not Currently    Types: Marijuana    Comment: Used to smoke in college   Sexual activity: Yes    Birth control/protection: I.U.D.    Comment: 1 partner - my husband  Other Topics Concern   Not on file  Social History Narrative   Caffeine: none   Social Determinants of Health   Financial Resource Strain: Low Risk  (07/09/2022)   Overall Financial Resource Strain (CARDIA)    Difficulty of Paying Living Expenses: Not very hard  Food Insecurity: No Food Insecurity (07/09/2022)   Hunger Vital Sign    Worried About Running Out of Food in the Last Year: Never true    Ran Out of Food in the Last Year: Never true  Transportation Needs: No Transportation Needs (07/09/2022)   PRAPARE -  Administrator, Civil Service  (Medical): No    Lack of Transportation (Non-Medical): No  Physical Activity: Sufficiently Active (07/09/2022)   Exercise Vital Sign    Days of Exercise per Week: 4 days    Minutes of Exercise per Session: 60 min  Stress: Stress Concern Present (07/09/2022)   [employer] of Occupational Health - Occupational Stress Questionnaire    Feeling of Stress : To some extent  Social Connections: Socially Integrated (07/09/2022)   Social Connection and Isolation Panel [NHANES]    Frequency of Communication with Friends and Family: More than three times a week    Frequency of Social Gatherings with Friends and Family: Once a week    Attends Religious Services: More than 4 times per year    Active Member of [employer] or Organizations: Yes    Attends Engineer, structural: More than 4 times per year    Marital Status: Married  Catering manager Violence: Not At Risk (02/14/2022)   Received from [employer], [facility]   HITS    Over the last 12 months how often did your partner physically hurt you?: 1    Over the last 12 months how often did your partner insult you or talk down to you?: 1    Over the last 12 months how often did your partner threaten you with physical harm?: 1    Over the last 12 months how often did your partner scream or curse at you?: 1                                                                                                  Objective:  Physical Exam: BP 124/78 (BP Location: Left Arm, Patient Position: Sitting, Cuff Size: Large)   Pulse 88   Temp 98 F (36.7 C) (Temporal)   Wt 236 lb 3.2 oz (107.1 kg)   SpO2 97%   BMI 34.88 kg/m   Wt Readings from Last 3 Encounters:  12/10/22 236 lb 3.2 oz (107.1 kg)  11/20/22 248 lb (112.5 kg)  10/30/22 242 lb (109.8 kg)     Physical Exam  DG Cervical Spine With Flex & Extend  Result Date: 11/28/2022 CLINICAL DATA:  Pain. EXAM: CERVICAL SPINE COMPLETE WITH FLEXION AND EXTENSION 7 VIEWS COMPARISON:  None  Available. FINDINGS: No fracture, dislocation or subluxation. No motion with flexion and extension to suggest instability. No osteolytic or osteoblastic changes. Prevertebral and cervicocranial soft tissues are unremarkable. IMPRESSION: Unremarkable examination of the cervical spine. Electronically Signed   By: Layla Maw M.D.   On: 11/28/2022 20:01   DG Knee Complete 4 Views Right  Result Date: 11/28/2022 CLINICAL DATA:  Pain. EXAM: Bilateral knees 4  views COMPARISON:  None Available. FINDINGS: No evidence of fracture, dislocation, or joint effusion. No evidence of arthropathy or other focal bone abnormality. Soft tissues are unremarkable. IMPRESSION: Negative. Electronically Signed   By: Layla Maw M.D.   On: 11/28/2022 20:00   DG Knee Complete 4 Views Left  Result Date: 11/28/2022 CLINICAL DATA:  Pain. EXAM: Bilateral knees 4  views COMPARISON:  None Available. FINDINGS: No evidence of fracture, dislocation, or joint effusion. No evidence of arthropathy or other focal bone abnormality. Soft tissues are unremarkable. IMPRESSION: Negative. Electronically Signed   By: Layla Maw M.D.   On: 11/28/2022 20:00   DG Shoulder Right  Result Date: 11/28/2022 CLINICAL DATA:  Pain. EXAM: RIGHT SHOULDER - 3 VIEW; LEFT SHOULDER - 3 VIEW COMPARISON:  None Available. FINDINGS: There is no evidence of fracture or dislocation. There is no evidence of arthropathy or other focal bone abnormality. Soft tissues are unremarkable. IMPRESSION: Negative. Electronically Signed   By: Layla Maw M.D.   On: 11/28/2022 19:58   DG Shoulder Left  Result Date: 11/28/2022 CLINICAL DATA:  Pain. EXAM: RIGHT SHOULDER - 3 VIEW; LEFT SHOULDER - 3 VIEW COMPARISON:  None Available. FINDINGS: There is no evidence of fracture or dislocation. There is no evidence of arthropathy or other focal bone abnormality. Soft tissues are unremarkable. IMPRESSION: Negative. Electronically Signed   By: Layla Maw M.D.   On:  11/28/2022 19:58    Recent Results (from the past 2160 hour(s))  Lyme Disease Serology w/Reflex     Status: None   Collection Time: 10/30/22  2:04 PM  Result Value Ref Range   Lyme Total Antibody EIA Negative Negative    Comment: Lyme antibodies not detected. Reflex testing is not indicated. No laboratory evidence of infection with B. burgdorferi (Lyme disease). Negative results may occur in patients recently infected (less than or equal to 14 days) with B. burgdorferi.  If recent infection is suspected, repeat testing on a new sample collected in 7 to 14 days is recommended.         Garner Nash, MD, MS

## 2022-12-10 NOTE — Assessment & Plan Note (Signed)
Continue to follow with Pain Management  monitor for any changes in symptoms and report as needed.

## 2022-12-10 NOTE — Assessment & Plan Note (Addendum)
Referral to Dr. Regina Eck at [facility] for a second opinion and further evaluation. Suggested trial of nutritional supplements: over-the-counter amino acid or peptide-based shakes, e.g., Pediasure Peptide. Hyoscyamine (Levsin) 0.125 mg every 4 hours as needed for abdominal discomfort and cramping. Ondansetron 4 mg every 8 hours as needed for nausea Monitor nutritional status and hydration closely.

## 2022-12-18 ENCOUNTER — Encounter: Payer: Self-pay | Admitting: Family Medicine

## 2022-12-27 ENCOUNTER — Ambulatory Visit: Payer: 59 | Admitting: Family Medicine

## 2022-12-27 ENCOUNTER — Encounter: Payer: Self-pay | Admitting: Family Medicine

## 2022-12-27 VITALS — BP 118/80 | HR 96 | Temp 97.0°F | Ht 69.0 in | Wt 225.8 lb

## 2022-12-27 DIAGNOSIS — R1084 Generalized abdominal pain: Secondary | ICD-10-CM | POA: Diagnosis not present

## 2022-12-27 DIAGNOSIS — R634 Abnormal weight loss: Secondary | ICD-10-CM

## 2022-12-27 DIAGNOSIS — K582 Mixed irritable bowel syndrome: Secondary | ICD-10-CM | POA: Diagnosis not present

## 2022-12-27 NOTE — Progress Notes (Signed)
Assessment/Plan:   Problem List Items Addressed This Visit       Digestive   IBS (irritable bowel syndrome)   Relevant Orders   Comp Met (CMET) (Completed)   CBC w/Diff (Completed)   Urinalysis w microscopic + reflex cultur (Completed)   Ambulatory referral to Gastroenterology     Other   Unintended weight loss - Primary    Significant weight loss over the past two months potentially due to inadequate caloric intake and abdominal pain. Likely chronic illness and underlying gastrointestinal disorder exacerbating nutritional absorption and intake.  Plan: Urgent referral to gastroenterology. Lab assessment with CBC and CMP. Encourage increased caloric intake via liquid meals [facility]) and continued hydration Emergent and follow-up precautions discussed especially if decreased p.o. intake, worsening abdominal pain or vomiting or other worrisome symptoms occur      Relevant Orders   Comp Met (CMET) (Completed)   CBC w/Diff (Completed)   Urinalysis w microscopic + reflex cultur (Completed)   Ambulatory referral to Gastroenterology   Other Visit Diagnoses     Generalized abdominal pain       Relevant Orders   Comp Met (CMET) (Completed)   CBC w/Diff (Completed)   Urinalysis w microscopic + reflex cultur (Completed)   Ambulatory referral to Gastroenterology       There are no discontinued medications.  Return if symptoms worsen or fail to improve.    Subjective:   Encounter date: 12/27/2022  Shelby Dyer is a 27 y.o. female who has Hypermobility syndrome; Dysautonomia orthostatic hypotension syndrome; Mast cell activation (HCC); Chronic migraine without aura, with intractable migraine, so stated, with status migrainosus; Inappropriate sinus tachycardia (HCC); Median nerve neuritis, right; Idiopathic hypersomnolence; IBS (irritable bowel syndrome); Hip pain; Chronic idiopathic constipation; Ehlers-Danlos syndrome; PTSD (post-traumatic stress disorder);  Patellofemoral pain syndrome of both knees; Myofascial pain syndrome, cervical; Needle stick, hypodermic, accidental, initial encounter; Foreign body of left index finger with infection; Chronic pain of multiple joints; Class 2 obesity in adult; Migraine with aura and with status migrainosus, not intractable; and Unintended weight loss on their problem list..   She  has a past medical history of Allergy (2000), Anemia (2020), Anxiety, Asthma (Early 2021), Chronic idiopathic constipation, Chronic migraine with aura, Depression (2012?), Hypermobile Ehlers-Danlos syndrome, Idiopathic hypersomnolence, Mast cell activation syndrome (HCC), Myofascial pain syndrome, cervical (11/14/2021), Peripheral neuropathy, POTS (postural orthostatic tachycardia syndrome), and Ulcer (2018)..   Chief Complaint: Significant unintentional weight loss and abdominal pain.  History of Present Illness (HPI):  Patient presents for evaluation and discussion regarding her Family and Medical Leave Act (FMLA) paperwork. She has had significant weight loss of 23 pounds since August, attributed to severe dietary restrictions and extensive abdominal pain, limiting her caloric intake to approximately 700 calories per day. Her last gastroenterology appointment was in June, with the follow-up not scheduled until December.  She maintains hydration primarily with electrolyte solutions (Liquid I.V. and Gatorade), but experiences fatigue, dizziness, and occasional vomiting despite ondansetron use.  She has a complex medical history, including Positional Orthostatic Tachycardia Syndrome,  Mast Cell Activation Syndrome  and IBS with constipation and diarrhea.. 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 She currently experiences severe abdominal pain with food intake, leading to a manageable but insufficient diet comprising mostly of liquids  or easy-to-digest foods, resulting in a significant weight loss.  Overall, the symptoms include debilitating fatigue and dizziness, limiting her physical activities such  as standing, lifting, and walking.  Review of Systems  Constitutional:  Positive for malaise/fatigue and weight loss.  Gastrointestinal:  Positive for abdominal pain, constipation, diarrhea, nausea and vomiting. Negative for blood in stool.  Neurological:  Positive for dizziness and headaches.    Past Surgical History:  Procedure Laterality Date   adenoids removed     double scope      Tub place in both ear  Bilateral    When she was 27 years of age   WISDOM TOOTH EXTRACTION  2006   All 4    Outpatient Medications Prior to Visit  Medication Sig Dispense Refill   acetaminophen (TYLENOL) 500 MG tablet Take 1,000 mg by mouth as needed.     Ascorbic Acid (VITAMIN C PO) Take 500-2,000 mg by mouth daily in the afternoon.     atenolol (TENORMIN) 25 MG tablet Take by mouth daily. 25 mg in the morning and 12.5 mg mid day.     cetirizine (ZYRTEC) 10 MG tablet Take 20 mg by mouth in the morning and at bedtime.     Coenzyme Q10 (COQ-10 PO) Take 400 mg by mouth daily at 6 (six) AM.     cromolyn (GASTROCROM) 100 MG/5ML solution Take 200-800 mg by mouth 4 (four) times daily.     cromolyn (NASALCROM) 5.2 MG/ACT nasal spray 1-2 sprays as needed.     EPINEPHrine (PRIMATENE MIST) 0.125 MG/ACT AERO 1-2 puffs as needed.     EPINEPHrine 0.3 mg/0.3 mL IJ SOAJ injection Inject 0.3 mg into the muscle as needed for anaphylaxis.     famotidine (PEPCID) 20 MG tablet Take 20 mg by mouth 2 (two) times daily.     gabapentin (NEURONTIN) 100 MG capsule Take 100 mg by mouth 3 (three) times daily.     Galcanezumab-gnlm (EMGALITY) 120 MG/ML SOAJ Inject 120 mg into the skin every 30 (thirty) days. 1 mL 11   hydroxychloroquine (PLAQUENIL) 200 MG tablet Take 200 mg by mouth 2 (two) times daily.     hydrOXYzine (ATARAX) 25 MG tablet Take 25-50 mg by mouth  every 8 (eight) hours as needed (allergies).     hyoscyamine (LEVSIN) 0.125 MG tablet Take 1 tablet (0.125 mg total) by mouth every 4 (four) hours as needed for cramping (abdominal discomfort). 30 tablet 0   KETOTIFEN FUMARATE OP 1 mg by Miscellaneous route two (2) times a day. Start with 1mg  PO every day x 1 week, then may increase to 1mg  PO BID     levonorgestrel (MIRENA) 20 MCG/24HR IUD 1 each by Intrauterine route once.     lubiprostone (AMITIZA) 8 MCG capsule Take 8 mcg by mouth 2 (two) times daily with a meal.     Multiple Vitamin (MULTIVITAMIN) tablet Take 1 tablet by mouth daily.     Multiple Vitamins-Minerals (ZINC PO) Take by mouth.     Naltrexone HCl POWD 1 mg by mouth daily for 30 days     nortriptyline (PAMELOR) 25 MG capsule Take 75 mg by mouth daily after supper.     Nutritional Supplements (PEDIASURE PEPTIDE 1.0 CAL) LIQD 1 each by Enteral route 3 (three) times daily as needed.     Omega-3 Fatty Acids (FISH OIL PO) Take by mouth daily at 6 (six) AM.     ondansetron (ZOFRAN) 4 MG tablet Take 1 tablet (4 mg total) by mouth every 8 (eight) hours as needed for nausea or vomiting. 20 tablet 0   Polyethylene Glycol 3350 (MIRALAX PO) Take by mouth.  QUERCETIN PO Take 3,000 mg by mouth daily in the afternoon.     Riboflavin 100 MG TABS Take 200 mg by mouth 2 (two) times daily.     Rimegepant Sulfate (NURTEC) 75 MG TBDP Take 1 tablet (75 mg total) by mouth daily as needed. For migraines. Take as close to onset of migraine as possible. One daily maximum. 6 tablet 0   TURMERIC CURCUMIN PO Take 1,000 mg by mouth daily at 6 (six) AM.     UNABLE TO FIND Take by mouth 2 (two) times daily. Med Name: MAGNESIUM COMPLEX  200 mg AM and 400 mg PM     UNABLE TO FIND Take 1,000 mg by mouth daily. Med Name: VITASSIUM brand salt capsules     XOLAIR 150 MG/ML prefilled syringe Inject 300 mg into the skin every 14 (fourteen) days.     No facility-administered medications prior to visit.    Family  History  Problem Relation Age of Onset   Migraines Mother    Anxiety disorder Mother    Depression Mother    Ehlers-Danlos syndrome Mother    Atrial fibrillation Father    Other Sister    Anxiety disorder Sister    Depression Sister    Learning disabilities Sister    Other Sister        POTS   Hypertension Brother    Asthma Brother    Learning disabilities Brother    Depression Brother    Hypertension Brother    Learning disabilities Brother    Obesity Brother    Migraines Maternal Grandmother    Kidney disease Maternal Grandfather    Stroke Maternal Grandfather    Breast cancer Paternal Grandmother    Cancer Paternal Grandmother    Varicose Veins Paternal Grandmother    Cancer Paternal Grandfather    Early death Maternal Aunt    Colon cancer Neg Hx    Stomach cancer Neg Hx    Liver cancer Neg Hx    Pancreatic cancer Neg Hx     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  Tobacco Use   Smoking status: Former    Current packs/day: 0.00    Average packs/day: 0.5 packs/day for 2.0 years (1.0 ttl pk-yrs)    Types: Cigarettes    Start date: 03/26/2014    Quit date: 03/26/2016    Years since quitting: 6.7   Smokeless tobacco: Never  Vaping Use   Vaping status: Never Used  Substance and Sexual Activity   Alcohol use: Yes    Alcohol/week: 1.0 - 2.0 standard drink of alcohol    Types: 1 - 2 Standard drinks or equivalent per week    Comment: Very rarely drink cocktail   Drug use: Not Currently    Types: Marijuana    Comment: Used to smoke in college   Sexual activity: Yes    Birth control/protection: I.U.D.    Comment: 1 partner - my husband  Other Topics Concern   Not on file  Social History Narrative    Caffeine: none   Social Determinants of Health   Financial Resource Strain: Low Risk  (07/09/2022)   Overall Financial Resource Strain (CARDIA)    Difficulty  of Paying Living Expenses: Not very hard  Food Insecurity: No Food Insecurity (07/09/2022)   Hunger Vital Sign    Worried About Running Out of Food in the Last Year: Never true    Ran Out of Food in the Last Year: Never true  Transportation Needs: No Transportation Needs (07/09/2022)   PRAPARE - Administrator, Civil Service (Medical): No    Lack of Transportation (Non-Medical): No  Physical Activity: Sufficiently Active (07/09/2022)   Exercise Vital Sign    Days of Exercise per Week: 4 days    Minutes of Exercise per Session: 60 min  Stress: Stress Concern Present (07/09/2022)   [employer] of Occupational Health - Occupational Stress Questionnaire    Feeling of Stress : To some extent  Social Connections: Socially Integrated (07/09/2022)   Social Connection and Isolation Panel [NHANES]    Frequency of Communication with Friends and Family: More than three times a week    Frequency of Social Gatherings with Friends and Family: Once a week    Attends Religious Services: More than 4 times per year    Active Member of [employer] or Organizations: Yes    Attends Engineer, structural: More than 4 times per year    Marital Status: Married  Catering manager Violence: Not At Risk (02/14/2022)   Received from [employer], [facility]   HITS    Over the last 12 months how often did your partner physically hurt you?: 1    Over the last 12 months how often did your partner insult you or talk down to you?: 1    Over the last 12 months how often did your partner threaten you with physical harm?: 1    Over the last 12 months how often did your partner scream or curse at you?: 1                                                                                                  Objective:  Physical Exam: BP 118/80    Pulse 96   Temp (!) 97 F (36.1 C) (Temporal)   Ht 5\' 9"  (1.753 m)   Wt 225 lb 12.8 oz (102.4 kg)   SpO2 100%   BMI 33.34 kg/m   Wt Readings from Last 3 Encounters:  12/27/22 225 lb 12.8 oz (102.4 kg)  12/10/22 236 lb 3.2 oz (107.1 kg)  11/20/22 248 lb (112.5 kg)     Physical Exam Constitutional:      General: She is not in acute distress.    Appearance: Normal appearance. She is not ill-appearing or toxic-appearing.  HENT:     Head: Normocephalic and atraumatic.     Nose: Nose normal. No congestion.     Mouth/Throat:     Mouth: Mucous membranes are dry.  Eyes:     General: No scleral icterus.    Extraocular Movements: Extraocular movements intact.     Comments: Sunken eyes consistent with dehydration  Cardiovascular:     Rate and Rhythm: Normal rate and regular rhythm.     Pulses: Normal pulses.  Heart sounds: Normal heart sounds.  Pulmonary:     Effort: Pulmonary effort is normal. No respiratory distress.     Breath sounds: Normal breath sounds.  Abdominal:     General: Abdomen is flat. Bowel sounds are normal.     Palpations: Abdomen is soft.  Musculoskeletal:        General: Normal range of motion.  Lymphadenopathy:     Cervical: No cervical adenopathy.  Skin:    General: Skin is warm and dry.     Findings: No rash.  Neurological:     General: No focal deficit present.     Mental Status: She is alert and oriented to person, place, and time. Mental status is at baseline.  Psychiatric:        Mood and Affect: Mood normal.        Behavior: Behavior normal.        Thought Content: Thought content normal.        Judgment: Judgment normal.     DG Cervical Spine With Flex & Extend  Result Date: 11/28/2022 CLINICAL DATA:  Pain. EXAM: CERVICAL SPINE COMPLETE WITH FLEXION AND EXTENSION 7 VIEWS COMPARISON:  None Available. FINDINGS: No fracture, dislocation or subluxation. No motion with flexion and extension to suggest instability. No osteolytic or osteoblastic  changes. Prevertebral and cervicocranial soft tissues are unremarkable. IMPRESSION: Unremarkable examination of the cervical spine. Electronically Signed   By: Layla Maw M.D.   On: 11/28/2022 20:01   DG Knee Complete 4 Views Right  Result Date: 11/28/2022 CLINICAL DATA:  Pain. EXAM: Bilateral knees 4  views COMPARISON:  None Available. FINDINGS: No evidence of fracture, dislocation, or joint effusion. No evidence of arthropathy or other focal bone abnormality. Soft tissues are unremarkable. IMPRESSION: Negative. Electronically Signed   By: Layla Maw M.D.   On: 11/28/2022 20:00   DG Knee Complete 4 Views Left  Result Date: 11/28/2022 CLINICAL DATA:  Pain. EXAM: Bilateral knees 4  views COMPARISON:  None Available. FINDINGS: No evidence of fracture, dislocation, or joint effusion. No evidence of arthropathy or other focal bone abnormality. Soft tissues are unremarkable. IMPRESSION: Negative. Electronically Signed   By: Layla Maw M.D.   On: 11/28/2022 20:00   DG Shoulder Right  Result Date: 11/28/2022 CLINICAL DATA:  Pain. EXAM: RIGHT SHOULDER - 3 VIEW; LEFT SHOULDER - 3 VIEW COMPARISON:  None Available. FINDINGS: There is no evidence of fracture or dislocation. There is no evidence of arthropathy or other focal bone abnormality. Soft tissues are unremarkable. IMPRESSION: Negative. Electronically Signed   By: Layla Maw M.D.   On: 11/28/2022 19:58   DG Shoulder Left  Result Date: 11/28/2022 CLINICAL DATA:  Pain. EXAM: RIGHT SHOULDER - 3 VIEW; LEFT SHOULDER - 3 VIEW COMPARISON:  None Available. FINDINGS: There is no evidence of fracture or dislocation. There is no evidence of arthropathy or other focal bone abnormality. Soft tissues are unremarkable. IMPRESSION: Negative. Electronically Signed   By: Layla Maw M.D.   On: 11/28/2022 19:58    Recent Results (from the past 2160 hour(s))  Lyme Disease Serology w/Reflex     Status: None   Collection Time: 10/30/22  2:04 PM   Result Value Ref Range   Lyme Total Antibody EIA Negative Negative    Comment: Lyme antibodies not detected. Reflex testing is not indicated. No laboratory evidence of infection with B. burgdorferi (Lyme disease). Negative results may occur in patients recently infected (less than or equal to 14 days) with B. burgdorferi.  If recent infection is suspected, repeat testing on a new sample collected in 7 to 14 days is recommended.   Comp Met (CMET)     Status: Abnormal   Collection Time: 12/27/22  2:42 PM  Result Value Ref Range   Sodium 139 135 - 145 mEq/L   Potassium 4.2 3.5 - 5.1 mEq/L   Chloride 104 96 - 112 mEq/L   CO2 26 19 - 32 mEq/L   Glucose, Bld 99 70 - 99 mg/dL   BUN 5 (L) 6 - 23 mg/dL   Creatinine, Ser 3.08 0.40 - 1.20 mg/dL   Total Bilirubin 0.4 0.2 - 1.2 mg/dL   Alkaline Phosphatase 27 (L) 39 - 117 U/L   AST 18 0 - 37 U/L   ALT 11 0 - 35 U/L   Total Protein 6.5 6.0 - 8.3 g/dL   Albumin 4.2 3.5 - 5.2 g/dL   GFR 657.84 >69.62 mL/min    Comment: Calculated using the CKD-EPI Creatinine Equation (2021)   Calcium 9.2 8.4 - 10.5 mg/dL  CBC w/Diff     Status: Abnormal   Collection Time: 12/27/22  2:42 PM  Result Value Ref Range   WBC 3.2 (L) 4.0 - 10.5 K/uL   RBC 4.02 3.87 - 5.11 Mil/uL   Hemoglobin 11.9 (L) 12.0 - 15.0 g/dL   HCT 95.2 84.1 - 32.4 %   MCV 89.8 78.0 - 100.0 fl   MCHC 32.9 30.0 - 36.0 g/dL   RDW 40.1 02.7 - 25.3 %   Platelets 192.0 150.0 - 400.0 K/uL   Neutrophils Relative % 46.6 43.0 - 77.0 %   Lymphocytes Relative 43.0 12.0 - 46.0 %   Monocytes Relative 8.1 3.0 - 12.0 %   Eosinophils Relative 1.0 0.0 - 5.0 %   Basophils Relative 1.3 0.0 - 3.0 %   Neutro Abs 1.5 1.4 - 7.7 K/uL   Lymphs Abs 1.4 0.7 - 4.0 K/uL   Monocytes Absolute 0.3 0.1 - 1.0 K/uL   Eosinophils Absolute 0.0 0.0 - 0.7 K/uL   Basophils Absolute 0.0 0.0 - 0.1 K/uL  Urinalysis w microscopic + reflex cultur     Status: None   Collection Time: 12/27/22  2:42 PM   Specimen: Blood   Result Value Ref Range   Color, Urine YELLOW YELLOW   APPearance CLEAR CLEAR   Specific Gravity, Urine 1.006 1.001 - 1.035   pH 7.0 5.0 - 8.0   Glucose, UA NEGATIVE NEGATIVE   Bilirubin Urine NEGATIVE NEGATIVE   Ketones, ur NEGATIVE NEGATIVE   Hgb urine dipstick NEGATIVE NEGATIVE   Protein, ur NEGATIVE NEGATIVE   Nitrites, Initial NEGATIVE NEGATIVE   Leukocyte Esterase NEGATIVE NEGATIVE   WBC, UA NONE SEEN 0 - 5 /HPF   RBC / HPF NONE SEEN 0 - 2 /HPF   Squamous Epithelial / HPF 0-5 < OR = 5 /HPF   Bacteria, UA NONE SEEN NONE SEEN /HPF   Hyaline Cast NONE SEEN NONE SEEN /LPF   Note      Comment: This urine was analyzed for the presence of WBC,  RBC, bacteria, casts, and other formed elements.  Only those elements seen were reported. . .   REFLEXIVE URINE CULTURE     Status: None   Collection Time: 12/27/22  2:42 PM  Result Value Ref Range   Reflexve Urine Culture      Comment: NO CULTURE INDICATED        Garner Nash, MD, MS

## 2022-12-27 NOTE — Patient Instructions (Addendum)
Continue to drink plenty of liquids. Please follow up with gastroenterology appointment. We are reaching out to them to help assist with this process.  If weight loss persists or accelerates, or you feel more fatigued, or if any other symptoms become unmanageable, go to the hospital

## 2022-12-28 DIAGNOSIS — R634 Abnormal weight loss: Secondary | ICD-10-CM | POA: Insufficient documentation

## 2022-12-28 LAB — COMPREHENSIVE METABOLIC PANEL
ALT: 11 U/L (ref 0–35)
AST: 18 U/L (ref 0–37)
Albumin: 4.2 g/dL (ref 3.5–5.2)
Alkaline Phosphatase: 27 U/L — ABNORMAL LOW (ref 39–117)
BUN: 5 mg/dL — ABNORMAL LOW (ref 6–23)
CO2: 26 meq/L (ref 19–32)
Calcium: 9.2 mg/dL (ref 8.4–10.5)
Chloride: 104 meq/L (ref 96–112)
Creatinine, Ser: 0.72 mg/dL (ref 0.40–1.20)
GFR: 115.08 mL/min (ref >60.00)
Glucose, Bld: 99 mg/dL (ref 70–99)
Potassium: 4.2 meq/L (ref 3.5–5.1)
Sodium: 139 meq/L (ref 135–145)
Total Bilirubin: 0.4 mg/dL (ref 0.2–1.2)
Total Protein: 6.5 g/dL (ref 6.0–8.3)

## 2022-12-28 LAB — URINALYSIS W MICROSCOPIC + REFLEX CULTURE
Bacteria, UA: NONE SEEN /[HPF]
Bilirubin Urine: NEGATIVE
Glucose, UA: NEGATIVE
Hgb urine dipstick: NEGATIVE
Hyaline Cast: NONE SEEN /[LPF]
Ketones, ur: NEGATIVE
Leukocyte Esterase: NEGATIVE
Nitrites, Initial: NEGATIVE
Protein, ur: NEGATIVE
RBC / HPF: NONE SEEN /[HPF] (ref 0–2)
Specific Gravity, Urine: 1.006 (ref 1.001–1.035)
WBC, UA: NONE SEEN /[HPF] (ref 0–5)
pH: 7 (ref 5.0–8.0)

## 2022-12-28 LAB — NO CULTURE INDICATED

## 2022-12-28 LAB — CBC WITH DIFFERENTIAL/PLATELET
Basophils Absolute: 0 10*3/uL (ref 0.0–0.1)
Basophils Relative: 1.3 % (ref 0.0–3.0)
Eosinophils Absolute: 0 10*3/uL (ref 0.0–0.7)
Eosinophils Relative: 1 % (ref 0.0–5.0)
HCT: 36.1 % (ref 36.0–46.0)
Hemoglobin: 11.9 g/dL — ABNORMAL LOW (ref 12.0–15.0)
Lymphocytes Relative: 43 % (ref 12.0–46.0)
Lymphs Abs: 1.4 10*3/uL (ref 0.7–4.0)
MCHC: 32.9 g/dL (ref 30.0–36.0)
MCV: 89.8 fL (ref 78.0–100.0)
Monocytes Absolute: 0.3 10*3/uL (ref 0.1–1.0)
Monocytes Relative: 8.1 % (ref 3.0–12.0)
Neutro Abs: 1.5 10*3/uL (ref 1.4–7.7)
Neutrophils Relative %: 46.6 % (ref 43.0–77.0)
Platelets: 192 10*3/uL (ref 150.0–400.0)
RBC: 4.02 Mil/uL (ref 3.87–5.11)
RDW: 12.3 % (ref 11.5–15.5)
WBC: 3.2 10*3/uL — ABNORMAL LOW (ref 4.0–10.5)

## 2022-12-28 NOTE — Assessment & Plan Note (Signed)
Significant weight loss over the past two months potentially due to inadequate caloric intake and abdominal pain. Likely chronic illness and underlying gastrointestinal disorder exacerbating nutritional absorption and intake.  Plan: Urgent referral to gastroenterology. Lab assessment with CBC and CMP. Encourage increased caloric intake via liquid meals [facility]) and continued hydration Emergent and follow-up precautions discussed especially if decreased p.o. intake, worsening abdominal pain or vomiting or other worrisome symptoms occur

## 2023-01-28 ENCOUNTER — Encounter: Payer: Self-pay | Admitting: Family Medicine

## 2023-01-28 DIAGNOSIS — K582 Mixed irritable bowel syndrome: Secondary | ICD-10-CM

## 2023-01-28 DIAGNOSIS — R634 Abnormal weight loss: Secondary | ICD-10-CM

## 2023-02-19 ENCOUNTER — Ambulatory Visit: Payer: 59 | Admitting: Neurology

## 2023-02-19 ENCOUNTER — Encounter: Payer: Self-pay | Admitting: Neurology

## 2023-02-19 ENCOUNTER — Telehealth: Payer: Self-pay | Admitting: Gastroenterology

## 2023-02-19 VITALS — BP 134/83 | HR 77 | Ht 69.0 in | Wt 216.8 lb

## 2023-02-19 DIAGNOSIS — G43711 Chronic migraine without aura, intractable, with status migrainosus: Secondary | ICD-10-CM | POA: Diagnosis not present

## 2023-02-19 DIAGNOSIS — R519 Headache, unspecified: Secondary | ICD-10-CM

## 2023-02-19 DIAGNOSIS — E538 Deficiency of other specified B group vitamins: Secondary | ICD-10-CM

## 2023-02-19 DIAGNOSIS — G4489 Other headache syndrome: Secondary | ICD-10-CM

## 2023-02-19 DIAGNOSIS — R7309 Other abnormal glucose: Secondary | ICD-10-CM

## 2023-02-19 DIAGNOSIS — E531 Pyridoxine deficiency: Secondary | ICD-10-CM

## 2023-02-19 DIAGNOSIS — E559 Vitamin D deficiency, unspecified: Secondary | ICD-10-CM

## 2023-02-19 DIAGNOSIS — R42 Dizziness and giddiness: Secondary | ICD-10-CM

## 2023-02-19 DIAGNOSIS — R200 Anesthesia of skin: Secondary | ICD-10-CM

## 2023-02-19 DIAGNOSIS — R202 Paresthesia of skin: Secondary | ICD-10-CM

## 2023-02-19 DIAGNOSIS — R51 Headache with orthostatic component, not elsewhere classified: Secondary | ICD-10-CM

## 2023-02-19 DIAGNOSIS — H532 Diplopia: Secondary | ICD-10-CM

## 2023-02-19 DIAGNOSIS — R5383 Other fatigue: Secondary | ICD-10-CM

## 2023-02-19 DIAGNOSIS — G43009 Migraine without aura, not intractable, without status migrainosus: Secondary | ICD-10-CM

## 2023-02-19 DIAGNOSIS — E519 Thiamine deficiency, unspecified: Secondary | ICD-10-CM

## 2023-02-19 DIAGNOSIS — R4701 Aphasia: Secondary | ICD-10-CM

## 2023-02-19 MED ORDER — EMGALITY 120 MG/ML ~~LOC~~ SOAJ: 120.0000 mg | SUBCUTANEOUS | 11 refills | Status: DC

## 2023-02-19 MED ORDER — UBRELVY 100 MG PO TABS: 100.0000 mg | ORAL_TABLET | ORAL | 11 refills | Status: DC | PRN

## 2023-02-19 NOTE — Patient Instructions (Addendum)
Continue Emgality, great response Ubrelvy acutely Bloodwork for many causes of neuropathy Emg/ncs? Or possibly small-fiber biopsy? Look for the emg/ncs results and ask her to think about it. Mychart the code   There is increased risk for stroke in women with migraine with aura and a contraindication for the combined contraceptive pill for use by women who have migraine with aura. The risk for women with migraine without aura is lower. However other risk factors like smoking are far more likely to increase stroke risk than migraine. There is a recommendation for no smoking and for the use of OCPs without estrogen such as progestogen only pills particularly for women with migraine with aura.Marland Kitchen People who have migraine headaches with auras may be 3 times more likely to have a stroke caused by a blood clot, compared to migraine patients who don't see auras. Women who take hormone-replacement therapy may be 30 percent more likely to suffer a clot-based stroke than women not taking medication containing estrogen. Other risk factors like smoking and high blood pressure may be  much more important. And stroke is still a rare complication due to migraine aura and is controversial and lower doses may not cause a risk.   Ubrogepant Tablets What is this medication? UBROGEPANT (ue BROE je pant) treats migraines. It works by blocking a substance in the body that causes migraines. It is not used to prevent migraines. This medicine may be used for other purposes; ask your health care provider or pharmacist if you have questions. COMMON BRAND NAME(S): Bernita Raisin What should I tell my care team before I take this medication? They need to know if you have any of these conditions: Kidney disease Liver disease An unusual or allergic reaction to ubrogepant, other medications, foods, dyes, or preservatives Pregnant or trying to get pregnant Breast-feeding How should I use this medication? Take this medication by mouth  with a glass of water. Take it as directed on the prescription label. You can take it with or without food. If it upsets your stomach, take it with food. Keep taking it unless your care team tells you to stop. Talk to your care team about the use of this medication in children. Special care may be needed. Overdosage: If you think you have taken too much of this medicine contact a poison control center or emergency room at once. NOTE: This medicine is only for you. Do not share this medicine with others. What if I miss a dose? This does not apply. This medication is not for regular use. What may interact with this medication? Do not take this medication with any of the following: Adagrasib Ceritinib Certain antibiotics, such as chloramphenicol, clarithromycin, telithromycin Certain antivirals for HIV, such as atazanavir, cobicistat, darunavir, delavirdine, fosamprenavir, indinavir, ritonavir Certain medications for fungal infections, such as itraconazole, ketoconazole, posaconazole, voriconazole Conivaptan Grapefruit Idelalisib Mifepristone Nefazodone Ribociclib This medication may also interact with the following: Carvedilol Certain medications for seizures, such as phenobarbital, phenytoin Ciprofloxacin Cyclosporine Eltrombopag Fluconazole Fluvoxamine Quinidine Rifampin St. John's wort Verapamil This list may not describe all possible interactions. Give your health care provider a list of all the medicines, herbs, non-prescription drugs, or dietary supplements you use. Also tell them if you smoke, drink alcohol, or use illegal drugs. Some items may interact with your medicine. What should I watch for while using this medication? Visit your care team for regular checks on your progress. Tell your care team if your symptoms do not start to get better or if they get worse.  Your mouth may get dry. Chewing sugarless gum or sucking hard candy and drinking plenty of water may help.  Contact your care team if the problem does not go away or is severe. What side effects may I notice from receiving this medication? Side effects that you should report to your care team as soon as possible: Allergic reactions--skin rash, itching, hives, swelling of the face, lips, tongue, or throat Side effects that usually do not require medical attention (report to your care team if they continue or are bothersome): Drowsiness Dry mouth Fatigue Nausea This list may not describe all possible side effects. Call your doctor for medical advice about side effects. You may report side effects to FDA at 1-800-FDA-1088. Where should I keep my medication? Keep out of the reach of children and pets. Store between 15 and 30 degrees C (59 and 86 degrees F). Get rid of any unused medication after the expiration date. To get rid of medications that are no longer needed or have expired: Take the medication to a medication take-back program. Check with your pharmacy or law enforcement to find a location. If you cannot return the medication, check the label or package insert to see if the medication should be thrown out in the garbage or flushed down the toilet. If you are not sure, ask your care team. If it is safe to put it in the trash, pour the medication out of the container. Mix the medication with cat litter, dirt, coffee grounds, or other unwanted substance. Seal the mixture in a bag or container. Put it in the trash. NOTE: This sheet is a summary. It may not cover all possible information. If you have questions about this medicine, talk to your doctor, pharmacist, or health care provider.  2024 Elsevier/Gold Standard (2021-05-05 00:00:00) Galcanezumab Injection What is this medication? GALCANEZUMAB (gal ka NEZ ue mab) prevents migraines. It works by blocking a substance in the body that causes migraines. It may also be used to treat cluster headaches. It is a monoclonal antibody. This medicine may be  used for other purposes; ask your health care provider or pharmacist if you have questions. COMMON BRAND NAME(S): Emgality What should I tell my care team before I take this medication? They need to know if you have any of these conditions: An unusual or allergic reaction to galcanezumab, other medications, foods, dyes, or preservatives Pregnant or trying to get pregnant Breast-feeding How should I use this medication? This medication is injected under the skin. You will be taught how to prepare and give it. Take it as directed on the prescription label. Keep taking it unless your care team tells you to stop. It is important that you put your used needles and syringes in a special sharps container. Do not put them in a trash can. If you do not have a sharps container, call your pharmacist or care team to get one. Talk to your care team about the use of this medication in children. Special care may be needed. Overdosage: If you think you have taken too much of this medicine contact a poison control center or emergency room at once. NOTE: This medicine is only for you. Do not share this medicine with others. What if I miss a dose? If you miss a dose, take it as soon as you can. If it is almost time for your next dose, take only that dose. Do not take double or extra doses. What may interact with this medication? Interactions are not expected.  This list may not describe all possible interactions. Give your health care provider a list of all the medicines, herbs, non-prescription drugs, or dietary supplements you use. Also tell them if you smoke, drink alcohol, or use illegal drugs. Some items may interact with your medicine. What should I watch for while using this medication? Visit your care team for regular checks on your progress. Tell your care team if your symptoms do not start to get better or if they get worse. What side effects may I notice from receiving this medication? Side effects that  you should report to your care team as soon as possible: Allergic reactions or angioedema--skin rash, itching or hives, swelling of the face, eyes, lips, tongue, arms, or legs, trouble swallowing or breathing Side effects that usually do not require medical attention (report to your care team if they continue or are bothersome): Pain, redness, or irritation at injection site This list may not describe all possible side effects. Call your doctor for medical advice about side effects. You may report side effects to FDA at 1-800-FDA-1088. Where should I keep my medication? Keep out of the reach of children and pets. Store in a refrigerator or at room temperature between 20 and 25 degrees C (68 and 77 degrees F). Refrigeration (preferred): Store in the refrigerator. Do not freeze. Keep in the original container until you are ready to take it. Remove the dose from the carton about 30 minutes before it is time for you to use it. If the dose is not used, it may be stored in original container at room temperature for 7 days. Get rid of any unused medication after the expiration date. Room Temperature: This medication may be stored at room temperature for up to 7 days. Keep it in the original container. Protect from light until time of use. If it is stored at room temperature, get rid of any unused medication after 7 days or after it expires, whichever is first. To get rid of medications that are no longer needed or have expired: Take the medication to a medication take-back program. Check with your pharmacy or law enforcement to find a location. If you cannot return the medication, ask your pharmacist or care team how to get rid of this medication safely. NOTE: This sheet is a summary. It may not cover all possible information. If you have questions about this medicine, talk to your doctor, pharmacist, or health care provider.  2024 Elsevier/Gold Standard (2021-05-08 00:00:00)

## 2023-02-19 NOTE — Progress Notes (Signed)
[facility]    Provider:  Dr Lucia Gaskins Requesting Provider: Garnette Gunner, MD Primary Care Provider:  Garnette Gunner, MD  CC:  migraines  02/19/2023: Much better on Emgality only 4 migraines a month and < 8 total headache days a month. Tried nurtec samples did not help. Tried triptans in the past. Try Bernita Raisin. With and without her migraines she can get slurred speech, dizziness, double vision, worse at work. Likely a migraine aura but needs thorough evaluation due to concerning symptoms. Burning, numbness and tingling from the calfs down and in the hands. Feels like a bug crawling up the leg. Twitching in the lower legs. In all the fingers. Had an abnormal sweat test.   Reviewed outside labs:  Recent Results (from the past 2160 hour(s))  Comp Met (CMET)     Status: Abnormal   Collection Time: 12/27/22  2:42 PM  Result Value Ref Range   Sodium 139 135 - 145 mEq/L   Potassium 4.2 3.5 - 5.1 mEq/L   Chloride 104 96 - 112 mEq/L   CO2 26 19 - 32 mEq/L   Glucose, Bld 99 70 - 99 mg/dL   BUN 5 (L) 6 - 23 mg/dL   Creatinine, Ser 2.13 0.40 - 1.20 mg/dL   Total Bilirubin 0.4 0.2 - 1.2 mg/dL   Alkaline Phosphatase 27 (L) 39 - 117 U/L   AST 18 0 - 37 U/L   ALT 11 0 - 35 U/L   Total Protein 6.5 6.0 - 8.3 g/dL   Albumin 4.2 3.5 - 5.2 g/dL   GFR 086.57 >84.69 mL/min    Comment: Calculated using the CKD-EPI Creatinine Equation (2021)   Calcium 9.2 8.4 - 10.5 mg/dL  CBC w/Diff     Status: Abnormal   Collection Time: 12/27/22  2:42 PM  Result Value Ref Range   WBC 3.2 (L) 4.0 - 10.5 K/uL   RBC 4.02 3.87 - 5.11 Mil/uL   Hemoglobin 11.9 (L) 12.0 - 15.0 g/dL   HCT 62.9 52.8 - 41.3 %   MCV 89.8 78.0 - 100.0 fl   MCHC 32.9 30.0 - 36.0 g/dL   RDW 24.4 01.0 - 27.2 %   Platelets 192.0 150.0 - 400.0 K/uL   Neutrophils Relative % 46.6 43.0 - 77.0 %   Lymphocytes Relative 43.0 12.0 - 46.0 %   Monocytes Relative 8.1 3.0 - 12.0 %   Eosinophils Relative 1.0 0.0 - 5.0 %    Basophils Relative 1.3 0.0 - 3.0 %   Neutro Abs 1.5 1.4 - 7.7 K/uL   Lymphs Abs 1.4 0.7 - 4.0 K/uL   Monocytes Absolute 0.3 0.1 - 1.0 K/uL   Eosinophils Absolute 0.0 0.0 - 0.7 K/uL   Basophils Absolute 0.0 0.0 - 0.1 K/uL  Urinalysis w microscopic + reflex cultur     Status: None   Collection Time: 12/27/22  2:42 PM   Specimen: Blood  Result Value Ref Range   Color, Urine YELLOW YELLOW   APPearance CLEAR CLEAR   Specific Gravity, Urine 1.006 1.001 - 1.035   pH 7.0 5.0 - 8.0   Glucose, UA NEGATIVE NEGATIVE   Bilirubin Urine NEGATIVE NEGATIVE   Ketones, ur NEGATIVE NEGATIVE   Hgb urine dipstick NEGATIVE NEGATIVE   Protein, ur NEGATIVE NEGATIVE   Nitrites, Initial NEGATIVE NEGATIVE   Leukocyte Esterase NEGATIVE NEGATIVE   WBC, UA NONE SEEN 0 - 5 /HPF   RBC / HPF NONE SEEN 0 - 2 /HPF   Squamous Epithelial /  HPF 0-5 < OR = 5 /HPF   Bacteria, UA NONE SEEN NONE SEEN /HPF   Hyaline Cast NONE SEEN NONE SEEN /LPF   Note      Comment: This urine was analyzed for the presence of WBC,  RBC, bacteria, casts, and other formed elements.  Only those elements seen were reported. . .   REFLEXIVE URINE CULTURE     Status: None   Collection Time: 12/27/22  2:42 PM  Result Value Ref Range   Reflexve Urine Culture      Comment: NO CULTURE INDICATED  TSH Rfx on Abnormal to Free T4     Status: None   Collection Time: 02/19/23  4:57 PM  Result Value Ref Range   TSH 1.770 0.450 - 4.500 uIU/mL  Hemoglobin A1c     Status: Abnormal   Collection Time: 02/19/23  4:57 PM  Result Value Ref Range   Hgb A1c MFr Bld 5.8 (H) 4.8 - 5.6 %    Comment:          Prediabetes: 5.7 - 6.4          Diabetes: >6.4          Glycemic control for adults with diabetes: <7.0    Est. average glucose Bld gHb Est-mCnc 120 mg/dL  Z61 and Folate Panel     Status: None   Collection Time: 02/19/23  4:57 PM  Result Value Ref Range   Vitamin B-12 546 232 - 1,245 pg/mL   Folate 14.6 >3.0 ng/mL    Comment: A serum folate  concentration of less than 3.1 ng/mL is considered to represent clinical deficiency.   Vitamin B1     Status: None   Collection Time: 02/19/23  4:57 PM  Result Value Ref Range   Thiamine 92.9 66.5 - 200.0 nmol/L  Vitamin B6     Status: None (Preliminary result)   Collection Time: 02/19/23  4:57 PM  Result Value Ref Range   Vitamin B6 WILL FOLLOW   Methylmalonic acid, serum     Status: None   Collection Time: 02/19/23  4:57 PM  Result Value Ref Range   Methylmalonic Acid 166 0 - 378 nmol/L  Angiotensin converting enzyme     Status: None   Collection Time: 02/19/23  4:57 PM  Result Value Ref Range   Angio Convert Enzyme 62 14 - 82 U/L  Sedimentation rate     Status: None   Collection Time: 02/19/23  4:57 PM  Result Value Ref Range   Sed Rate 2 0 - 32 mm/hr  Hepatitis C antibody     Status: None   Collection Time: 02/19/23  4:57 PM  Result Value Ref Range   Hep C Virus Ab Non Reactive Non Reactive    Comment: HCV antibody alone does not differentiate between previously resolved infection and active infection. Equivocal and Reactive HCV antibody results should be followed up with an HCV RNA test to support the diagnosis of active HCV infection.   Heavy metals, blood     Status: None   Collection Time: 02/19/23  4:57 PM  Result Value Ref Range   Lead, Blood <1.0 0.0 - 3.4 ug/dL    Comment: Testing performed by Inductively coupled Administrator, [employer].                           Environmental Exposure:  WHO Recommendation     <5.0                           Occupational Exposure:                            OSHA Lead Std          40.0                            BEI                    30.0                                 Detection Limit =  1.0    Arsenic 1 0 - 9 ug/L    Comment:                                 Detection Limit = 1   Mercury <1.0 0.0 - 14.9 ug/L    Comment:                                 Detection Limit =  1.0  Multiple Myeloma Panel  (SPEP&IFE w/QIG)     Status: None (Preliminary result)   Collection Time: 02/19/23  4:57 PM  Result Value Ref Range   IgG (Immunoglobin G), Serum WILL FOLLOW    IgA/Immunoglobulin A, Serum WILL FOLLOW    IgM (Immunoglobulin M), Srm WILL FOLLOW    Total Protein WILL FOLLOW    Albumin SerPl Elph-Mcnc WILL FOLLOW    Alpha 1 WILL FOLLOW    Alpha2 Glob SerPl Elph-Mcnc WILL FOLLOW    B-Globulin SerPl Elph-Mcnc WILL FOLLOW    Gamma Glob SerPl Elph-Mcnc WILL FOLLOW    M Protein SerPl Elph-Mcnc WILL FOLLOW    Globulin, Total WILL FOLLOW    Albumin/Glob SerPl WILL FOLLOW    IFE 1 WILL FOLLOW    Please Note WILL FOLLOW   Copper, serum     Status: None   Collection Time: 02/19/23  4:57 PM  Result Value Ref Range   Copper 91 80 - 158 ug/dL    Comment:                                 Detection Limit = 5  ANA Comprehensive Panel     Status: None   Collection Time: 02/19/23  4:57 PM  Result Value Ref Range   dsDNA Ab 1 0 - 9 IU/mL    Comment:                                    Negative      <5                                    Equivocal  5 - 9  Positive      >9    ENA RNP Ab <0.2 0.0 - 0.9 AI   ENA SM Ab Ser-aCnc <0.2 0.0 - 0.9 AI   Scleroderma (Scl-70) (ENA) Antibody, IgG <0.2 0.0 - 0.9 AI   ENA SSA (RO) Ab <0.2 0.0 - 0.9 AI   ENA SSB (LA) Ab <0.2 0.0 - 0.9 AI   Chromatin Ab SerPl-aCnc <0.2 0.0 - 0.9 AI   Anti JO-1 <0.2 0.0 - 0.9 AI   Centromere Ab Screen <0.2 0.0 - 0.9 AI   See below: Comment     Comment: Autoantibody                       Disease Association ------------------------------------------------------------                         Condition                  Frequency ---------------------   ------------------------   --------- Antinuclear Antibody,    SLE, mixed connective Direct (ANA-D)           tissue diseases ---------------------   ------------------------   --------- dsDNA                    SLE                        40 -  60% ---------------------   ------------------------   --------- Chromatin                Drug induced SLE                90%                          SLE                        48 - 97% ---------------------   ------------------------   --------- SSA (Ro)                 SLE                        25 - 35%                          Sjogren's Syndrome         40 - 70%                          Neonatal Lupus                 100% ---------------------   ------------------------   --------- SSB (La)                 SLE                              10%                          Sjogren's Syndrome              30% ---------------------   -----------------------    --------- Sm (anti-Smith)          SLE  15 - 30% ---------------------   -----------------------    --------- RNP                      Mixed Connective Tissue                          Disease                         95% (U1 nRNP,                SLE                        30 - 50% anti-ribonucleoprotein)  Polymyositis and/or                          Dermatomyositis                 20% ---------------------   ------------------------   --------- Scl-70 (antiDNA          Scleroderma (diffuse)      20 - 35% topoisomerase)           Crest                           13% ---------------------   ------------------------   --------- Jo-1                     Polymyositis and/or                          Dermatomyositis            20 - 40% ---------------------   ------------------------   --------- Centromere B             Scleroderma -  Crest                          variant                         80%   Vitamin D, 25-hydroxy     Status: Abnormal   Collection Time: 02/19/23  4:57 PM  Result Value Ref Range   Vit D, 25-Hydroxy 29.8 (L) 30.0 - 100.0 ng/mL    Comment: Vitamin D deficiency has been defined by the Institute of Medicine and an Endocrine Society practice guideline as a level of serum 25-OH vitamin D less  than 20 ng/mL (1,2). The Endocrine Society went on to further define vitamin D insufficiency as a level between 21 and 29 ng/mL (2). 1. IOM (Institute of Medicine). 2010. Dietary reference    intakes for calcium and D. [place]: The    [employer]. 2. Holick MF, Binkley [place], Bischoff-Ferrari HA, et al.    Evaluation, treatment, and prevention of vitamin D    deficiency: an Endocrine Society clinical practice    guideline. JCEM. 2011 Jul; 96(7):1911-30.     Patient complains of symptoms per HPI as well as the following symptoms: none . Pertinent negatives and positives per HPI. All others negative  HPI:  Shelby Dyer is a 27 y.o. female here as requested by Garnette Gunner, MD for migraines.  I reviewed [facility] notes Dr. Hollice Espy: She has a  past medical history of anxiety, PTSD, IBS, POTS/dysautonomia, mast cell activation, chronic constipation who was seen [facility] neurology for worsening headaches and some concern for IIH due to a family history of it.  She usually has a service dog with her.  Last time she was seen was May 2020 for and her migraines were stable at about 5-8 a month but unfortunately she continues to struggle with several other medical issues, her GI remains concerned that nortriptyline is making her GI issues worse.  Her husband has a job and neurology and she moved towards the [facility] area.  CT of the head in 2022 showed no acute findings, other workup reported normal eye exam the same week she was seen.  Patient reports her migraines are pulsating pounding throbbing, nausea, photophobia and phonophobia, hurts to move, can be unilateral.  Lasts up to 24 hours. Started having migraines as long as she can remember. Starting worsening 12/2020, in the setting of stress and moving. Nortriptyline helps some but worried affecting her GI problems. She was having 30 migraine days a month lasting 1-2 days moderately severe to about 12 total migraine days currently  on the nortriptyline and > 15 total headache days a month, no medication overuse, she may get static vision during of before the migraine, completely resolvable. Can be on either side and spread to the whole head. Unknown what is making them worse or triggering, laying down in a dark room helps.   Reviewed notes, labs and imaging from outside physicians, which showed:  From a thorough review of records, medications tried greater than 3 months that can be used in migraine management include: Topamax with side effects, metoprolol ineffective, Relpax, Maxalt, Nurtec no change, Advil mixed results, magnesium, nortriptyline, aimovig contraindicated due to constipation, emgality  CT heas 01/2021:  INDICATION:Headache  TECHNIQUE:  CT HEAD WO IV CONTRAST  FINDINGS:  No acute intracranial hemorrhage identified. No acute midline shift or acute mass effect. No abnormal extra-axial fluid collections are seen.   Ventricles, cisterns, and sulci are unremarkable.  Visualized orbits are unremarkable.   Visualized paranasal sinuses are clear.   Mastoid air cells are clear. No displaced or depressed calvarial fracture identified. Procedure Note  Lambert Keto, MD - 02/04/2021 Formatting of this note might be different from the original.   INDICATION:Headache  TECHNIQUE:  CT HEAD WO IV CONTRAST  FINDINGS:  No acute intracranial hemorrhage identified. No acute midline shift or acute mass effect. No abnormal extra-axial fluid collections are seen.   Ventricles, cisterns, and sulci are unremarkable.  Visualized orbits are unremarkable.   Visualized paranasal sinuses are clear.   Mastoid air cells are clear. No displaced or depressed calvarial fracture identified.    IMPRESSION: No acute intracranial abnormality identified.   Recent Results (from the past 2160 hour(s))  Comp Met (CMET)     Status: Abnormal   Collection Time: 12/27/22  2:42 PM  Result Value Ref Range   Sodium 139 135  - 145 mEq/L   Potassium 4.2 3.5 - 5.1 mEq/L   Chloride 104 96 - 112 mEq/L   CO2 26 19 - 32 mEq/L   Glucose, Bld 99 70 - 99 mg/dL   BUN 5 (L) 6 - 23 mg/dL   Creatinine, Ser 1.61 0.40 - 1.20 mg/dL   Total Bilirubin 0.4 0.2 - 1.2 mg/dL   Alkaline Phosphatase 27 (L) 39 - 117 U/L   AST 18 0 - 37 U/L   ALT 11 0 - 35 U/L  Total Protein 6.5 6.0 - 8.3 g/dL   Albumin 4.2 3.5 - 5.2 g/dL   GFR 782.95 >62.13 mL/min    Comment: Calculated using the CKD-EPI Creatinine Equation (2021)   Calcium 9.2 8.4 - 10.5 mg/dL  CBC w/Diff     Status: Abnormal   Collection Time: 12/27/22  2:42 PM  Result Value Ref Range   WBC 3.2 (L) 4.0 - 10.5 K/uL   RBC 4.02 3.87 - 5.11 Mil/uL   Hemoglobin 11.9 (L) 12.0 - 15.0 g/dL   HCT 08.6 57.8 - 46.9 %   MCV 89.8 78.0 - 100.0 fl   MCHC 32.9 30.0 - 36.0 g/dL   RDW 62.9 52.8 - 41.3 %   Platelets 192.0 150.0 - 400.0 K/uL   Neutrophils Relative % 46.6 43.0 - 77.0 %   Lymphocytes Relative 43.0 12.0 - 46.0 %   Monocytes Relative 8.1 3.0 - 12.0 %   Eosinophils Relative 1.0 0.0 - 5.0 %   Basophils Relative 1.3 0.0 - 3.0 %   Neutro Abs 1.5 1.4 - 7.7 K/uL   Lymphs Abs 1.4 0.7 - 4.0 K/uL   Monocytes Absolute 0.3 0.1 - 1.0 K/uL   Eosinophils Absolute 0.0 0.0 - 0.7 K/uL   Basophils Absolute 0.0 0.0 - 0.1 K/uL  Urinalysis w microscopic + reflex cultur     Status: None   Collection Time: 12/27/22  2:42 PM   Specimen: Blood  Result Value Ref Range   Color, Urine YELLOW YELLOW   APPearance CLEAR CLEAR   Specific Gravity, Urine 1.006 1.001 - 1.035   pH 7.0 5.0 - 8.0   Glucose, UA NEGATIVE NEGATIVE   Bilirubin Urine NEGATIVE NEGATIVE   Ketones, ur NEGATIVE NEGATIVE   Hgb urine dipstick NEGATIVE NEGATIVE   Protein, ur NEGATIVE NEGATIVE   Nitrites, Initial NEGATIVE NEGATIVE   Leukocyte Esterase NEGATIVE NEGATIVE   WBC, UA NONE SEEN 0 - 5 /HPF   RBC / HPF NONE SEEN 0 - 2 /HPF   Squamous Epithelial / HPF 0-5 < OR = 5 /HPF   Bacteria, UA NONE SEEN NONE SEEN /HPF   Hyaline  Cast NONE SEEN NONE SEEN /LPF   Note      Comment: This urine was analyzed for the presence of WBC,  RBC, bacteria, casts, and other formed elements.  Only those elements seen were reported. . .   REFLEXIVE URINE CULTURE     Status: None   Collection Time: 12/27/22  2:42 PM  Result Value Ref Range   Reflexve Urine Culture      Comment: NO CULTURE INDICATED  TSH Rfx on Abnormal to Free T4     Status: None   Collection Time: 02/19/23  4:57 PM  Result Value Ref Range   TSH 1.770 0.450 - 4.500 uIU/mL  Hemoglobin A1c     Status: Abnormal   Collection Time: 02/19/23  4:57 PM  Result Value Ref Range   Hgb A1c MFr Bld 5.8 (H) 4.8 - 5.6 %    Comment:          Prediabetes: 5.7 - 6.4          Diabetes: >6.4          Glycemic control for adults with diabetes: <7.0    Est. average glucose Bld gHb Est-mCnc 120 mg/dL  K44 and Folate Panel     Status: None   Collection Time: 02/19/23  4:57 PM  Result Value Ref Range   Vitamin B-12 546 232 - 1,245  pg/mL   Folate 14.6 >3.0 ng/mL    Comment: A serum folate concentration of less than 3.1 ng/mL is considered to represent clinical deficiency.   Vitamin B1     Status: None   Collection Time: 02/19/23  4:57 PM  Result Value Ref Range   Thiamine 92.9 66.5 - 200.0 nmol/L  Vitamin B6     Status: None (Preliminary result)   Collection Time: 02/19/23  4:57 PM  Result Value Ref Range   Vitamin B6 WILL FOLLOW   Methylmalonic acid, serum     Status: None   Collection Time: 02/19/23  4:57 PM  Result Value Ref Range   Methylmalonic Acid 166 0 - 378 nmol/L  Angiotensin converting enzyme     Status: None   Collection Time: 02/19/23  4:57 PM  Result Value Ref Range   Angio Convert Enzyme 62 14 - 82 U/L  Sedimentation rate     Status: None   Collection Time: 02/19/23  4:57 PM  Result Value Ref Range   Sed Rate 2 0 - 32 mm/hr  Hepatitis C antibody     Status: None   Collection Time: 02/19/23  4:57 PM  Result Value Ref Range   Hep C Virus Ab Non  Reactive Non Reactive    Comment: HCV antibody alone does not differentiate between previously resolved infection and active infection. Equivocal and Reactive HCV antibody results should be followed up with an HCV RNA test to support the diagnosis of active HCV infection.   Heavy metals, blood     Status: None   Collection Time: 02/19/23  4:57 PM  Result Value Ref Range   Lead, Blood <1.0 0.0 - 3.4 ug/dL    Comment: Testing performed by Inductively coupled Administrator, [employer].                           Environmental Exposure:                            WHO Recommendation     <5.0                           Occupational Exposure:                            OSHA Lead Std          40.0                            BEI                    30.0                                 Detection Limit =  1.0    Arsenic 1 0 - 9 ug/L    Comment:                                 Detection Limit = 1   Mercury <1.0 0.0 - 14.9 ug/L    Comment:  Detection Limit =  1.0  Multiple Myeloma Panel (SPEP&IFE w/QIG)     Status: None (Preliminary result)   Collection Time: 02/19/23  4:57 PM  Result Value Ref Range   IgG (Immunoglobin G), Serum WILL FOLLOW    IgA/Immunoglobulin A, Serum WILL FOLLOW    IgM (Immunoglobulin M), Srm WILL FOLLOW    Total Protein WILL FOLLOW    Albumin SerPl Elph-Mcnc WILL FOLLOW    Alpha 1 WILL FOLLOW    Alpha2 Glob SerPl Elph-Mcnc WILL FOLLOW    B-Globulin SerPl Elph-Mcnc WILL FOLLOW    Gamma Glob SerPl Elph-Mcnc WILL FOLLOW    M Protein SerPl Elph-Mcnc WILL FOLLOW    Globulin, Total WILL FOLLOW    Albumin/Glob SerPl WILL FOLLOW    IFE 1 WILL FOLLOW    Please Note WILL FOLLOW   Copper, serum     Status: None   Collection Time: 02/19/23  4:57 PM  Result Value Ref Range   Copper 91 80 - 158 ug/dL    Comment:                                 Detection Limit = 5  ANA Comprehensive Panel     Status: None   Collection Time: 02/19/23  4:57 PM  Result  Value Ref Range   dsDNA Ab 1 0 - 9 IU/mL    Comment:                                    Negative      <5                                    Equivocal  5 - 9                                    Positive      >9    ENA RNP Ab <0.2 0.0 - 0.9 AI   ENA SM Ab Ser-aCnc <0.2 0.0 - 0.9 AI   Scleroderma (Scl-70) (ENA) Antibody, IgG <0.2 0.0 - 0.9 AI   ENA SSA (RO) Ab <0.2 0.0 - 0.9 AI   ENA SSB (LA) Ab <0.2 0.0 - 0.9 AI   Chromatin Ab SerPl-aCnc <0.2 0.0 - 0.9 AI   Anti JO-1 <0.2 0.0 - 0.9 AI   Centromere Ab Screen <0.2 0.0 - 0.9 AI   See below: Comment     Comment: Autoantibody                       Disease Association ------------------------------------------------------------                         Condition                  Frequency ---------------------   ------------------------   --------- Antinuclear Antibody,    SLE, mixed connective Direct (ANA-D)           tissue diseases ---------------------   ------------------------   --------- dsDNA                    SLE  40 - 60% ---------------------   ------------------------   --------- Chromatin                Drug induced SLE                90%                          SLE                        48 - 97% ---------------------   ------------------------   --------- SSA (Ro)                 SLE                        25 - 35%                          Sjogren's Syndrome         40 - 70%                          Neonatal Lupus                 100% ---------------------   ------------------------   --------- SSB (La)                 SLE                              10%                          Sjogren's Syndrome              30% ---------------------   -----------------------    --------- Sm (anti-Smith)          SLE                        15 - 30% ---------------------   -----------------------    --------- RNP                      Mixed Connective Tissue                          Disease                          95% (U1 nRNP,                SLE                        30 - 50% anti-ribonucleoprotein)  Polymyositis and/or                          Dermatomyositis                 20% ---------------------   ------------------------   --------- Scl-70 (antiDNA          Scleroderma (diffuse)      20 - 35% topoisomerase)           Crest  13% ---------------------   ------------------------   --------- Jo-1                     Polymyositis and/or                          Dermatomyositis            20 - 40% ---------------------   ------------------------   --------- Centromere B             Scleroderma -  Crest                          variant                         80%   Vitamin D, 25-hydroxy     Status: Abnormal   Collection Time: 02/19/23  4:57 PM  Result Value Ref Range   Vit D, 25-Hydroxy 29.8 (L) 30.0 - 100.0 ng/mL    Comment: Vitamin D deficiency has been defined by the Institute of Medicine and an Endocrine Society practice guideline as a level of serum 25-OH vitamin D less than 20 ng/mL (1,2). The Endocrine Society went on to further define vitamin D insufficiency as a level between 21 and 29 ng/mL (2). 1. IOM (Institute of Medicine). 2010. Dietary reference    intakes for calcium and D. [place]: The    [employer]. 2. Holick MF, Binkley [place], Bischoff-Ferrari HA, et al.    Evaluation, treatment, and prevention of vitamin D    deficiency: an Endocrine Society clinical practice    guideline. JCEM. 2011 Jul; 96(7):1911-30.    Magnesium 2.0 -- Load older lab results  Sodium -- 139 Load older lab results  Potassium -- 3.9 Load older lab results  Chloride -- 109 High    Load older lab results  CO2 -- 24.7 Load older lab results  BUN -- 6 Low    Load older lab results  Creatinine -- 0.69 Load older lab results  EST.GFR (MDRD) -- -- Load older lab results  Anion Gap -- 5 Load older lab results  BUN/Creatinine Ratio -- 9 Load older lab results   Glucose -- 94 Load older lab results  Calcium -- 9.1 Load older lab results  eGFR CKD-EPI (2021) Female -- >90   Albumin -- 3.8   Total Protein -- 6.5   Total Bilirubin -- 0.4   AST -- 13   ALT -- 7 Low      Alkaline Phosphatase -- 33 Low        CBC  [facility]06/29/2023  Component 09/21/2021       WBC 3.6 Load older lab results  RBC 3.92 Low    Load older lab results  HGB 11.4 Load older lab results  HCT 34.7 Load older lab results  MCV 88.6 Load older lab results  MCH 29.1 Load older lab results  MCHC 32.8 Load older lab results  RDW 13.3 Load older lab results  MPV 8.8 Load older lab results  Platelet 208 Load older lab results  nRBC 0     Review of Systems: Patient complains of symptoms per HPI as well as the following symptoms pots, gi probs, small fiber neuropathy. Pertinent negatives and positives per HPI. All others negative.   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  Tobacco Use   Smoking status: Former    Current packs/day: 0.00    Average packs/day: 0.5 packs/day for 2.0 years (1.0 ttl pk-yrs)    Types: Cigarettes    Start date: 03/26/2014    Quit date: 03/26/2016    Years since quitting: 6.9   Smokeless tobacco: Never  Vaping Use   Vaping status: Never Used  Substance and Sexual Activity   Alcohol use: Yes    Alcohol/week: 1.0 - 2.0 standard drink of alcohol    Types: 1 - 2 Standard drinks or equivalent per week    Comment: Very rarely drink cocktail   Drug use: Not Currently    Types: Marijuana    Comment: Used to smoke in college   Sexual activity: Yes    Birth control/protection: I.U.D.    Comment: 1 partner - my husband  Other Topics Concern   Not on file  Social  History Narrative   Caffeine: none   Social Determinants of Health   Financial Resource Strain: Low Risk  (07/09/2022)   Overall Financial Resource Strain (CARDIA)    Difficulty of Paying Living Expenses: Not very hard  Food Insecurity: No Food Insecurity (07/09/2022)   Hunger Vital Sign    Worried About Running Out of Food in the Last Year: Never true    Ran Out of Food in the Last Year: Never true  Transportation Needs: No Transportation Needs (07/09/2022)   PRAPARE - Administrator, Civil Service (Medical): No    Lack of Transportation (Non-Medical): No  Physical Activity: Sufficiently Active (07/09/2022)   Exercise Vital Sign    Days of Exercise per Week: 4 days    Minutes of Exercise per Session: 60 min  Stress: Stress Concern Present (07/09/2022)   [employer] of Occupational Health - Occupational Stress Questionnaire    Feeling of Stress : To some extent  Social Connections: Socially Integrated (07/09/2022)   Social Connection and Isolation Panel [NHANES]    Frequency of Communication with Friends and Family: More than three times a week    Frequency of Social Gatherings with Friends and Family: Once a week    Attends Religious Services: More than 4 times per year    Active Member of [employer] or Organizations: Yes    Attends Engineer, structural: More than 4 times per year    Marital Status: Married  Catering manager Violence: Not At Risk (02/14/2022)   Received from [employer], [facility]   HITS    Over the last 12 months how often did your partner physically hurt you?: Never    Over the last 12 months how often did your partner insult you or talk down to you?: Never    Over the last 12 months how often did your partner threaten you with physical harm?: Never    Over the last 12 months how often did your partner scream or curse at you?: Never    Family History  Problem Relation Age of Onset   Migraines Mother    Anxiety disorder Mother     Depression Mother    Ehlers-Danlos syndrome Mother    Atrial fibrillation Father    Other Sister    Anxiety disorder Sister  Depression Sister    Learning disabilities Sister    Other Sister        POTS   Hypertension Brother    Asthma Brother    Learning disabilities Brother    Depression Brother    Hypertension Brother    Learning disabilities Brother    Obesity Brother    Migraines Maternal Grandmother    Kidney disease Maternal Grandfather    Stroke Maternal Grandfather    Breast cancer Paternal Grandmother    Cancer Paternal Grandmother    Varicose Veins Paternal Grandmother    Cancer Paternal Grandfather    Early death Maternal Aunt    Colon cancer Neg Hx    Stomach cancer Neg Hx    Liver cancer Neg Hx    Pancreatic cancer Neg Hx     Past Medical History:  Diagnosis Date   Allergy 2000   Hay fever, mcas, eczema, etc   Anemia 2020   Off/on low hct and/or low hemoglobin   Anxiety    Asthma Early 2021   Chronic idiopathic constipation    Chronic migraine with aura    Depression 2012?   Hypermobile Ehlers-Danlos syndrome    Idiopathic hypersomnolence    Mast cell activation syndrome (HCC)    Myofascial pain syndrome, cervical 11/14/2021   Peripheral neuropathy    small fiber, based on sweat test   POTS (postural orthostatic tachycardia syndrome)    Ulcer 2018   Stomach ulcer    Patient Active Problem List   Diagnosis Date Noted   Unintended weight loss 12/28/2022   Migraine with aura and with status migrainosus, not intractable 11/20/2022   Chronic pain of multiple joints 10/30/2022   Class 2 obesity in adult 10/30/2022   Foreign body of left index finger with infection 01/19/2022   Needle stick, hypodermic, accidental, initial encounter 01/01/2022   Patellofemoral pain syndrome of both knees 11/14/2021   Myofascial pain syndrome, cervical 11/14/2021   Ehlers-Danlos syndrome 11/06/2021   PTSD (post-traumatic stress disorder) 11/06/2021   Chronic  idiopathic constipation 10/10/2021   Hip pain 06/13/2021   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   Dysautonomia orthostatic hypotension syndrome 05/17/2021   Mast cell activation (HCC) 05/17/2021   Chronic migraine without aura, with intractable migraine, so stated, with status migrainosus 05/17/2021   IBS (irritable bowel syndrome) 04/03/2021   Median nerve neuritis, right 06/25/2019   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   Idiopathic hypersomnolence 06/10/2014    Past Surgical History:  Procedure Laterality Date   adenoids removed     double scope      Tub place in both ear  Bilateral    When she was 27 years of age   WISDOM TOOTH EXTRACTION  2006   All 4    Current Outpatient Medications  Medication Sig Dispense Refill   acetaminophen (TYLENOL) 500 MG tablet Take 1,000 mg by mouth as needed.     Ascorbic Acid (VITAMIN C PO) Take 500-2,000 mg by mouth daily in the afternoon.     atenolol (TENORMIN) 25 MG tablet Take by mouth daily. 25 mg in the morning and 12.5 mg mid day.     cetirizine (ZYRTEC) 10 MG tablet Take 20 mg by mouth in the morning and at bedtime.     Coenzyme Q10 (COQ-10 PO) Take 400 mg by mouth daily at 6 (six) AM.     cromolyn (GASTROCROM) 100 MG/5ML solution Take 200-800 mg by mouth 4 (four) times daily.     cromolyn (NASALCROM) 5.2 MG/ACT  nasal spray 1-2 sprays as needed.     EPINEPHrine (PRIMATENE MIST) 0.125 MG/ACT AERO 1-2 puffs as needed.     EPINEPHrine 0.3 mg/0.3 mL IJ SOAJ injection Inject 0.3 mg into the muscle as needed for anaphylaxis.     famotidine (PEPCID) 20 MG tablet Take 20 mg by mouth 2 (two) times daily.     gabapentin (NEURONTIN) 100 MG capsule Take 100 mg by mouth 3 (three) times daily.     hydroxychloroquine (PLAQUENIL) 200 MG tablet Take 200 mg by mouth 2 (two) times daily.     hydrOXYzine (ATARAX) 25 MG tablet Take 25-50 mg by mouth every 8 (eight) hours as needed (allergies).     hyoscyamine (LEVSIN) 0.125 MG tablet Take 1 tablet  (0.125 mg total) by mouth every 4 (four) hours as needed for cramping (abdominal discomfort). 30 tablet 0   levonorgestrel (MIRENA) 20 MCG/24HR IUD 1 each by Intrauterine route once.     lubiprostone (AMITIZA) 8 MCG capsule Take 8 mcg by mouth 2 (two) times daily with a meal.     Multiple Vitamin (MULTIVITAMIN) tablet Take 1 tablet by mouth daily.     Multiple Vitamins-Minerals (ZINC PO) Take by mouth.     Naltrexone HCl POWD 1 mg by mouth daily for 30 days     Naltrexone HCl, Pain, 1.5 MG CAPS Take 2 mg by mouth daily.     nortriptyline (PAMELOR) 25 MG capsule Take 75 mg by mouth daily after supper.     Nutritional Supplements (PEDIASURE PEPTIDE 1.0 CAL) LIQD 1 each by Enteral route 3 (three) times daily as needed.     Omega-3 Fatty Acids (FISH OIL PO) Take by mouth daily at 6 (six) AM.     ondansetron (ZOFRAN) 4 MG tablet Take 1 tablet (4 mg total) by mouth every 8 (eight) hours as needed for nausea or vomiting. 20 tablet 0   Polyethylene Glycol 3350 (MIRALAX PO) Take by mouth.     pyridostigmine (MESTINON) 60 MG tablet Take 60 mg by mouth 3 (three) times daily. 1/2 Tablet     QUERCETIN PO Take 3,000 mg by mouth daily in the afternoon.     Riboflavin 100 MG TABS Take 200 mg by mouth 2 (two) times daily.     Rimegepant Sulfate (NURTEC) 75 MG TBDP Take 1 tablet (75 mg total) by mouth daily as needed. For migraines. Take as close to onset of migraine as possible. One daily maximum. 6 tablet 0   Ubrogepant (UBRELVY) 100 MG TABS Take 1 tablet (100 mg total) by mouth every 2 (two) hours as needed. Maximum 200mg  a day. Please use copay card: BIN [id number] PCN [id number] GRP [id number] ID [id number] 16 tablet 11   UNABLE TO FIND Take by mouth 2 (two) times daily. Med Name: MAGNESIUM COMPLEX  200 mg AM and 400 mg PM     UNABLE TO FIND Take 1,000 mg by mouth daily. Med Name: VITASSIUM brand salt capsules     XOLAIR 150 MG/ML prefilled syringe Inject 300 mg into the skin every 14 (fourteen) days.      Galcanezumab-gnlm (EMGALITY) 120 MG/ML SOAJ Inject 120 mg into the skin every 30 (thirty) days. PLEASE USE COPAY CARD BIN: [id number] PCN PDMI GRP [id number] ID [id number] Expires 03/26/2023 3 mL 11   No current facility-administered medications for this visit.    Allergies as of 02/19/2023 - Review Complete 02/19/2023  Allergen Reaction Noted   Latex Anaphylaxis and Hives 06/16/2019   Nsaids  11/20/2022  Tilactase Nausea And Vomiting 09/21/2021    Vitals: BP 134/83 (BP Location: Right Arm, Patient Position: Sitting, Cuff Size: Normal)   Pulse 77   Ht 5\' 9"  (1.753 m)   Wt 216 lb 12.8 oz (98.3 kg)   BMI 32.02 kg/m  Last Weight:  Wt Readings from Last 1 Encounters:  02/19/23 216 lb 12.8 oz (98.3 kg)   Last Height:   Ht Readings from Last 1 Encounters:  02/19/23 5\' 9"  (1.753 m)    Physical exam: Exam: Gen: NAD, conversant, well nourised, obese, well groomed                     CV: RRR, no MRG. No Carotid Bruits. No peripheral edema, warm, nontender Eyes: Conjunctivae clear without exudates or hemorrhage  Neuro: Detailed Neurologic Exam  Speech:    Speech is normal; fluent and spontaneous with normal comprehension.  Cognition:    The patient is oriented to person, place, and time;     recent and remote memory intact;     language fluent;     normal attention, concentration,     fund of knowledge Cranial Nerves:    The pupils are equal, round, and reactive to light. The fundi are normal and spontaneous venous pulsations are present. Visual fields are full to finger confrontation. Extraocular movements are intact. Trigeminal sensation is intact and the muscles of mastication are normal. The face is symmetric. The palate elevates in the midline. Hearing intact. Voice is normal. Shoulder shrug is normal. The tongue has normal motion without fasciculations.   Coordination: nml  Gait: nml  Motor Observation:    No asymmetry, no atrophy, and no involuntary movements  noted. Tone:    Normal muscle tone.    Posture:    Posture is normal. normal erect    Strength:    Strength is V/V in the upper and lower limbs.      Sensation: intact to LT     Reflex Exam:  DTR's:    Deep tendon reflexes in the upper and lower extremities are normal bilaterally.   Toes:    The toes are downgoing bilaterally.   Clonus:    Clonus is absent.    Assessment/Plan:  Patient with chronic migraines on nortriptyline with great results on emgality now with < 8 migraine days a month and > 10 total hedache days a month.    Continue Emgality, great response Ubrelvy acutely Bloodwork for many causes of neuropathy Emg/ncs? Or possibly small-fiber biopsy? Look for the emg/ncs results and ask her to think about it. Mychart the code   There is increased risk for stroke in women with migraine with aura and a contraindication for the combined contraceptive pill for use by women who have migraine with aura. The risk for women with migraine without aura is lower. However other risk factors like smoking are far more likely to increase stroke risk than migraine. There is a recommendation for no smoking and for the use of OCPs without estrogen such as progestogen only pills particularly for women with migraine with aura.Marland Kitchen People who have migraine headaches with auras may be 3 times more likely to have a stroke caused by a blood clot, compared to migraine patients who don't see auras. Women who take hormone-replacement therapy may be 30 percent more likely to suffer a clot-based stroke than women not taking medication containing estrogen. Other risk factors like smoking and high blood pressure may be  much more important. And stroke  is still a rare complication due to migraine aura and is controversial and lower doses may not cause a risk.  MRI brain due to concerning symptoms of The primary encounter diagnosis was Chronic migraine without aura, with intractable migraine, so stated, with  status migrainosus. Diagnoses of Migraine without aura and without status migrainosus, not intractable, Diplopia, Positional headache, Morning headache, Other headache syndrome, Aphasia, Dizziness, Other fatigue, Numbness and tingling,  to look for space occupying mass, chiari or intracranial hypertension (pseudotumor), strokes, malignancies, vasculidities, demyelination(multiple sclerosis) or other   Meds ordered this encounter  Medications   Galcanezumab-gnlm (EMGALITY) 120 MG/ML SOAJ    Sig: Inject 120 mg into the skin every 30 (thirty) days. PLEASE USE COPAY CARD BIN: [id number] PCN PDMI GRP [id number] ID [id number] Expires 03/26/2023    Dispense:  3 mL    Refill:  11    Dispence a 3 month supply if insurance will approve otherwise dispense one month.PLEASE USE COPAY CARD BIN: [id number] PCN PDMI GRP [id number] ID [id number] Expires 03/26/2023   Ubrogepant (UBRELVY) 100 MG TABS    Sig: Take 1 tablet (100 mg total) by mouth every 2 (two) hours as needed. Maximum 200mg  a day. Please use copay card: BIN [id number] PCN [id number] GRP [id number] ID [id number]    Dispense:  16 tablet    Refill:  11    Please use copay card: BIN [id number] PCN 54 GRP [id number] ID [id number]   Orders Placed This Encounter  Procedures   MR BRAIN W WO CONTRAST   TSH Rfx on Abnormal to Free T4   Hemoglobin A1c   B12 and Folate Panel   Vitamin B1   Vitamin B6   Methylmalonic acid, serum   Angiotensin converting enzyme   Sedimentation rate   Sjogren's syndrome antibods(ssa + ssb)   ANCA TITERS   Hepatitis C antibody   Heavy metals, blood   Multiple Myeloma Panel (SPEP&IFE w/QIG)   Copper, serum   Zinc   ANA Comprehensive Panel   Vitamin D, 25-hydroxy   Zinc     Cc: Garnette Gunner, MD,  Garnette Gunner, MD  Naomie Dean, MD  [facility] [address] [place], [place] [address]  Phone [phone] Fax [phone]

## 2023-02-19 NOTE — Telephone Encounter (Signed)
Hi Dr. Barron Alvine,    We received a referral for patient for IBS with constipation and diarrhea. Patient was previously a patient of yours in July of 2023. Since then patient has been seen with [facility]. Patient is wishing to transfer her care due to feeling as though she has not been cared or concerned for. Patient's previous records are in Epic for you to review and advise on scheduling.   Thank you.

## 2023-02-23 ENCOUNTER — Encounter: Payer: Self-pay | Admitting: Family Medicine

## 2023-02-24 ENCOUNTER — Encounter: Payer: Self-pay | Admitting: Neurology

## 2023-02-26 ENCOUNTER — Ambulatory Visit (INDEPENDENT_AMBULATORY_CARE_PROVIDER_SITE_OTHER): Payer: 59

## 2023-02-26 DIAGNOSIS — R51 Headache with orthostatic component, not elsewhere classified: Secondary | ICD-10-CM

## 2023-02-26 DIAGNOSIS — R519 Headache, unspecified: Secondary | ICD-10-CM

## 2023-02-26 DIAGNOSIS — H532 Diplopia: Secondary | ICD-10-CM

## 2023-02-26 DIAGNOSIS — R42 Dizziness and giddiness: Secondary | ICD-10-CM

## 2023-02-26 DIAGNOSIS — R4701 Aphasia: Secondary | ICD-10-CM

## 2023-02-26 DIAGNOSIS — G4489 Other headache syndrome: Secondary | ICD-10-CM | POA: Diagnosis not present

## 2023-02-26 LAB — MULTIPLE MYELOMA PANEL, SERUM
Albumin SerPl Elph-Mcnc: 4.2 g/dL (ref 2.9–4.4)
Albumin/Glob SerPl: 1.6 (ref 0.7–1.7)
Alpha 1: 0.2 g/dL (ref 0.0–0.4)
Alpha2 Glob SerPl Elph-Mcnc: 0.6 g/dL (ref 0.4–1.0)
B-Globulin SerPl Elph-Mcnc: 0.9 g/dL (ref 0.7–1.3)
Gamma Glob SerPl Elph-Mcnc: 1 g/dL (ref 0.4–1.8)
Globulin, Total: 2.7 g/dL (ref 2.2–3.9)
IgA/Immunoglobulin A, Serum: 195 mg/dL (ref 87–352)
IgG (Immunoglobin G), Serum: 1164 mg/dL (ref 586–1602)
IgM (Immunoglobulin M), Srm: 50 mg/dL (ref 26–217)
Total Protein: 6.9 g/dL (ref 6.0–8.5)

## 2023-02-26 LAB — ANA COMPREHENSIVE PANEL
Anti JO-1: <0.2 AI (ref 0.0–0.9)
Centromere Ab Screen: <0.2 AI (ref 0.0–0.9)
Chromatin Ab SerPl-aCnc: <0.2 AI (ref 0.0–0.9)
ENA RNP Ab: <0.2 AI (ref 0.0–0.9)
ENA SM Ab Ser-aCnc: <0.2 AI (ref 0.0–0.9)
ENA SSA (RO) Ab: <0.2 AI (ref 0.0–0.9)
ENA SSB (LA) Ab: <0.2 AI (ref 0.0–0.9)
Scleroderma (Scl-70) (ENA) Antibody, IgG: <0.2 AI (ref 0.0–0.9)
dsDNA Ab: 1 [IU]/mL (ref 0–9)

## 2023-02-26 LAB — VITAMIN D 25 HYDROXY (VIT D DEFICIENCY, FRACTURES): Vit D, 25-Hydroxy: 29.8 ng/mL — ABNORMAL LOW (ref 30.0–100.0)

## 2023-02-26 LAB — B12 AND FOLATE PANEL
Folate: 14.6 ng/mL (ref >3.0)
Vitamin B-12: 546 pg/mL (ref 232–1245)

## 2023-02-26 LAB — METHYLMALONIC ACID, SERUM: Methylmalonic Acid: 166 nmol/L (ref 0–378)

## 2023-02-26 LAB — SEDIMENTATION RATE: Sed Rate: 2 mm/h (ref 0–32)

## 2023-02-26 LAB — HEAVY METALS, BLOOD
Arsenic: 1 ug/L (ref 0–9)
Lead, Blood: <1 ug/dL (ref 0.0–3.4)

## 2023-02-26 LAB — HEPATITIS C ANTIBODY: Hep C Virus Ab: NONREACTIVE

## 2023-02-26 LAB — VITAMIN B6: Vitamin B6: 26.2 ug/L (ref 3.4–65.2)

## 2023-02-26 LAB — HEMOGLOBIN A1C
Est. average glucose Bld gHb Est-mCnc: 120 mg/dL
Hgb A1c MFr Bld: 5.8 % — ABNORMAL HIGH (ref 4.8–5.6)

## 2023-02-26 LAB — VITAMIN B1: Thiamine: 92.9 nmol/L (ref 66.5–200.0)

## 2023-02-26 LAB — TSH RFX ON ABNORMAL TO FREE T4: TSH: 1.77 u[IU]/mL (ref 0.450–4.500)

## 2023-02-26 LAB — COPPER, SERUM: Copper: 91 ug/dL (ref 80–158)

## 2023-02-26 LAB — ANGIOTENSIN CONVERTING ENZYME: Angio Convert Enzyme: 62 U/L (ref 14–82)

## 2023-02-26 MED ORDER — GADOBENATE DIMEGLUMINE 529 MG/ML IV SOLN
20.0000 mL | Freq: Once | INTRAVENOUS | Status: AC | PRN
  Administered 2023-02-26: 20 mL via INTRAVENOUS

## 2023-02-27 NOTE — Telephone Encounter (Signed)
Patient's primary care office called regarding referral. Discussed Dr. Barron Alvine recommendations below. Patient is also scheduled with [facility] for 12/11.

## 2023-03-04 ENCOUNTER — Ambulatory Visit: Payer: 59 | Admitting: Family Medicine

## 2023-03-04 ENCOUNTER — Encounter: Payer: Self-pay | Admitting: Family Medicine

## 2023-03-04 VITALS — BP 110/82 | HR 80 | Temp 97.8°F | Wt 213.8 lb

## 2023-03-04 DIAGNOSIS — K5989 Other specified functional intestinal disorders: Secondary | ICD-10-CM | POA: Diagnosis not present

## 2023-03-04 DIAGNOSIS — R634 Abnormal weight loss: Secondary | ICD-10-CM | POA: Diagnosis not present

## 2023-03-04 DIAGNOSIS — R7303 Prediabetes: Secondary | ICD-10-CM

## 2023-03-04 DIAGNOSIS — E66811 Obesity, class 1: Secondary | ICD-10-CM

## 2023-03-04 DIAGNOSIS — Z6831 Body mass index (BMI) 31.0-31.9, adult: Secondary | ICD-10-CM

## 2023-03-04 DIAGNOSIS — E6609 Other obesity due to excess calories: Secondary | ICD-10-CM

## 2023-03-04 MED ORDER — MCT OIL PO OIL: 15.0000 mL | TOPICAL_OIL | Freq: Every day | ORAL | 11 refills | Status: DC

## 2023-03-04 MED ORDER — PANCREATIN 325 MG PO TABS: 1.0000 | ORAL_TABLET | Freq: Three times a day (TID) | ORAL | 11 refills | Status: DC

## 2023-03-04 NOTE — Assessment & Plan Note (Signed)
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 Persistent abdominal pain, constipation, and limited solid food intake. Mild improvement with increased pyridostigmine dosage.  Plan: Continue pyridostigmine (Mestinon) 60 mg twice daily. Consider trial of over-the-counter pancreatic enzyme supplementation. Follow up with gastroenterologist for further evaluation; consider second opinion. Reassess need for abdominal imaging if symptoms persist. Discuss incorporation of medium-chain triglycerides (MCTs) into diet.

## 2023-03-04 NOTE — Progress Notes (Signed)
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 Persistent abdominal pain, constipation, and limited solid food intake. Mild improvement with increased pyridostigmine dosage.  Plan: Continue pyridostigmine (Mestinon) 60 mg twice daily. Consider trial of over-the-counter pancreatic enzyme supplementation. Follow up with gastroenterologist for further evaluation; consider second opinion. Reassess need for abdominal imaging if symptoms persist. Discuss incorporation of medium-chain triglycerides (MCTs) into diet.      Relevant Medications   Pancreatin 325 MG TABS   medium chain triglycerides (MCT OIL) oil     Other   Unintended weight loss   Relevant Orders   Amb ref to Medical Nutrition Therapy-MNT   Prediabetes - Primary   Relevant Orders   Amb ref to Medical Nutrition Therapy-MNT   Other Visit Diagnoses     Class 1 obesity due to excess calories with serious comorbidity and body mass index (BMI) of 31.0 to 31.9 in adult       Relevant Orders   Amb ref to Medical Nutrition Therapy-MNT       There are no discontinued medications.  No follow-ups on file.    Subjective:   Encounter date: 03/04/2023  Shelby Dyer is a 27 y.o. female who has Hypermobility syndrome; Dysautonomia orthostatic hypotension syndrome; Mast cell activation (HCC); Chronic migraine without aura, with intractable migraine, so stated, with status migrainosus; Inappropriate sinus tachycardia (HCC); Median nerve neuritis, right; Idiopathic hypersomnolence; IBS (irritable bowel syndrome); Hip pain; Chronic idiopathic constipation; Ehlers-Danlos syndrome; PTSD (post-traumatic stress disorder); Patellofemoral pain syndrome of both knees; Myofascial pain syndrome, cervical; Needle stick, hypodermic, accidental, initial encounter; Foreign body of left index finger with infection; Chronic pain of  multiple joints; Class 2 obesity in adult; Migraine with aura and with status migrainosus, not intractable; Unintended weight loss; Generalized intestinal dysmotility; and Prediabetes on their problem list..   She  has a past medical history of Allergy (2000), Anemia (2020), Anxiety, Asthma (Early 2021), Chronic idiopathic constipation, Chronic migraine with aura, Depression (2012?), Hypermobile Ehlers-Danlos syndrome, Idiopathic hypersomnolence, Mast cell activation syndrome (HCC), Myofascial pain syndrome, cervical (11/14/2021), Peripheral neuropathy, POTS (postural orthostatic tachycardia syndrome), and Ulcer (2018)..    Chief Complaint: Discussion of elevated hemoglobin A1c and ongoing gastrointestinal symptoms.  History of Present Illness (HPI):  Shelby Dyer returns for follow-up regarding her elevated hemoglobin A1c and persistent gastrointestinal (GI) issues. She expresses concern about her A1c level being elevated, potentially due to increased intake of simple carbohydrates necessitated by her GI symptoms. Her diet remains limited, primarily consisting of liquids and some soups with minimal solids.  Her cardiologist recently increased pyridostigmine (Mestinon) to 60 mg twice daily last week to assist POTS associated intestinal dyskinesia. [name] reports that while Mestinon has been mildly helpful, it has not fully resolved her symptoms.  She continues to experience significant abdominal discomfort, especially with protein shakes, leading her to reduce their intake. A recent episode of diarrhea lasted about a week, after which she returned to constipation, having only three bowel movements in a month prior to the Mestinon dosage increase.  Shelby Dyer notes episodes of tachycardia despite being on atenolol, with heart rates fluctuating between 60 bpm and 120 bpm. She attributes these episodes to dehydration and has managed them with increased fluid intake. She denies any new chest pain or dyspnea  beyond her baseline POTS-related symptoms.  She expresses frustration with her current gastroenterologist's responsiveness and has a follow-up appointment  scheduled on the 11th. Shelby Dyer is considering seeking a second opinion due to ongoing symptoms and limited progress.  Past Surgical History:  Procedure Laterality Date   adenoids removed     double scope      Tub place in both ear  Bilateral    When she was 27 years of age   WISDOM TOOTH EXTRACTION  2006   All 4    Outpatient Medications Prior to Visit  Medication Sig Dispense Refill   acetaminophen (TYLENOL) 500 MG tablet Take 1,000 mg by mouth as needed.     Ascorbic Acid (VITAMIN C PO) Take 500-2,000 mg by mouth daily in the afternoon.     atenolol (TENORMIN) 25 MG tablet Take by mouth daily. 25 mg in the morning and 12.5 mg mid day.     cetirizine (ZYRTEC) 10 MG tablet Take 20 mg by mouth in the morning and at bedtime.     Coenzyme Q10 (COQ-10 PO) Take 400 mg by mouth daily at 6 (six) AM.     cromolyn (GASTROCROM) 100 MG/5ML solution Take 200-800 mg by mouth 4 (four) times daily.     cromolyn (NASALCROM) 5.2 MG/ACT nasal spray 1-2 sprays as needed.     EPINEPHrine (PRIMATENE MIST) 0.125 MG/ACT AERO 1-2 puffs as needed.     EPINEPHrine 0.3 mg/0.3 mL IJ SOAJ injection Inject 0.3 mg into the muscle as needed for anaphylaxis.     famotidine (PEPCID) 20 MG tablet Take 20 mg by mouth 2 (two) times daily.     gabapentin (NEURONTIN) 100 MG capsule Take 100 mg by mouth 3 (three) times daily.     Galcanezumab-gnlm (EMGALITY) 120 MG/ML SOAJ Inject 120 mg into the skin every 30 (thirty) days. PLEASE USE COPAY CARD BIN: [id number] PCN [id number] GRP [id number] ID [id number] Expires 03/26/2023 3 mL 11   hydroxychloroquine (PLAQUENIL) 200 MG tablet Take 200 mg by mouth 2 (two) times daily.     hydrOXYzine (ATARAX) 25 MG tablet Take 25-50 mg by mouth every 8 (eight) hours as needed (allergies).     hyoscyamine (LEVSIN) 0.125 MG tablet Take 1 tablet (0.125  mg total) by mouth every 4 (four) hours as needed for cramping (abdominal discomfort). 30 tablet 0   levonorgestrel (MIRENA) 20 MCG/24HR IUD 1 each by Intrauterine route once.     lubiprostone (AMITIZA) 8 MCG capsule Take 8 mcg by mouth 2 (two) times daily with a meal.     Multiple Vitamin (MULTIVITAMIN) tablet Take 1 tablet by mouth daily.     Multiple Vitamins-Minerals (ZINC PO) Take by mouth.     Naltrexone HCl POWD 1 mg by mouth daily for 30 days     Naltrexone HCl, Pain, 1.5 MG CAPS Take 2 mg by mouth daily.     nortriptyline (PAMELOR) 25 MG capsule Take 75 mg by mouth daily after supper.     Nutritional Supplements (PEDIASURE PEPTIDE 1.0 CAL) LIQD 1 each by Enteral route 3 (three) times daily as needed.     Omega-3 Fatty Acids (FISH OIL PO) Take by mouth daily at 6 (six) AM.     ondansetron (ZOFRAN) 4 MG tablet Take 1 tablet (4 mg total) by mouth every 8 (eight) hours as needed for nausea or vomiting. 20 tablet 0   Polyethylene Glycol 3350 (MIRALAX PO) Take by mouth.     pyridostigmine (MESTINON) 60 MG tablet Take 60 mg by mouth in the morning and at bedtime.     QUERCETIN PO Take 3,000 mg by mouth  daily in the afternoon.     Riboflavin 100 MG TABS Take 200 mg by mouth 2 (two) times daily.     Rimegepant Sulfate (NURTEC) 75 MG TBDP Take 1 tablet (75 mg total) by mouth daily as needed. For migraines. Take as close to onset of migraine as possible. One daily maximum. 6 tablet 0   Ubrogepant (UBRELVY) 100 MG TABS Take 1 tablet (100 mg total) by mouth every 2 (two) hours as needed. Maximum 200mg  a day. Please use copay card: BIN [id number] PCN [id number] GRP [id number] ID [id number] 16 tablet 11   UNABLE TO FIND Take by mouth 2 (two) times daily. Med Name: MAGNESIUM COMPLEX  200 mg AM and 400 mg PM     UNABLE TO FIND Take 1,000 mg by mouth daily. Med Name: VITASSIUM brand salt capsules     XOLAIR 150 MG/ML prefilled syringe Inject 300 mg into the skin every 14 (fourteen) days.     No  facility-administered medications prior to visit.    Family History  Problem Relation Age of Onset   Migraines Mother    Anxiety disorder Mother    Depression Mother    Ehlers-Danlos syndrome Mother    Atrial fibrillation Father    Other Sister    Anxiety disorder Sister    Depression Sister    Learning disabilities Sister    Other Sister        POTS   Hypertension Brother    Asthma Brother    Learning disabilities Brother    Depression Brother    Hypertension Brother    Learning disabilities Brother    Obesity Brother    Migraines Maternal Grandmother    Kidney disease Maternal Grandfather    Stroke Maternal Grandfather    Breast cancer Paternal Grandmother    Cancer Paternal Grandmother    Varicose Veins Paternal Grandmother    Cancer Paternal Grandfather    Early death Maternal Aunt    Colon cancer Neg Hx    Stomach cancer Neg Hx    Liver cancer Neg Hx    Pancreatic cancer Neg Hx     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  Tobacco Use   Smoking status: Former    Current packs/day: 0.00    Average packs/day: 0.5 packs/day for 2.0 years (1.0 ttl pk-yrs)    Types: Cigarettes    Start date: 03/26/2014    Quit date: 03/26/2016    Years since quitting: 6.9   Smokeless tobacco: Never  Vaping Use   Vaping status: Never Used  Substance and Sexual Activity   Alcohol use: Yes    Alcohol/week: 1.0 - 2.0 standard drink of alcohol    Types: 1 - 2 Standard drinks or equivalent per week    Comment: Very rarely drink cocktail   Drug use: Not Currently    Types: Marijuana    Comment: Used to smoke in college   Sexual activity: Yes    Birth control/protection: I.U.D.    Comment: 1 partner - my husband   Other Topics Concern   Not on file  Social History Narrative   Caffeine: none   Social Determinants of Corporate investment banker  Strain: Low Risk  (02/25/2023)   Overall Financial Resource Strain (CARDIA)    Difficulty of Paying Living Expenses: Not very hard  Food Insecurity: No Food Insecurity (02/25/2023)   Hunger Vital Sign    Worried About Running Out of Food in the Last Year: Never true    Ran Out of Food in the Last Year: Never true  Transportation Needs: No Transportation Needs (02/25/2023)   PRAPARE - Administrator, Civil Service (Medical): No    Lack of Transportation (Non-Medical): No  Physical Activity: Insufficiently Active (02/25/2023)   Exercise Vital Sign    Days of Exercise per Week: 3 days    Minutes of Exercise per Session: 30 min  Stress: No Stress Concern Present (02/25/2023)   [employer] of Occupational Health - Occupational Stress Questionnaire    Feeling of Stress : Only a little  Social Connections: Socially Integrated (02/25/2023)   Social Connection and Isolation Panel [NHANES]    Frequency of Communication with Friends and Family: Three times a week    Frequency of Social Gatherings with Friends and Family: Twice a week    Attends Religious Services: More than 4 times per year    Active Member of [employer] or Organizations: Yes    Attends Engineer, structural: More than 4 times per year    Marital Status: Married  Catering manager Violence: Not At Risk (02/14/2022)   Received from [employer], [facility]   HITS    Over the last 12 months how often did your partner physically hurt you?: Never    Over the last 12 months how often did your partner insult you or talk down to you?: Never    Over the last 12 months how often did your partner threaten you with physical harm?: Never    Over the last 12 months how often did your partner scream or curse at you?: Never                                                                                                   Objective:  Physical Exam: BP 110/82 (BP Location: Left Arm, Patient Position: Sitting, Cuff Size: Large)   Pulse 80   Temp 97.8 F (36.6 C) (Temporal)   Wt 213 lb 12.8 oz (97 kg)   SpO2 99%   BMI 31.57 kg/m   Wt Readings from Last 3 Encounters:  03/04/23 213 lb 12.8 oz (97 kg)  02/19/23 216 lb 12.8 oz (98.3 kg)  12/27/22 225 lb 12.8 oz (102.4 kg)     Physical Exam Constitutional:      General: She is not in acute distress.    Appearance: Normal appearance. She is not ill-appearing or toxic-appearing.  HENT:     Head: Normocephalic and atraumatic.     Nose: Nose normal. No congestion.  Eyes:     General: No scleral icterus.    Extraocular Movements: Extraocular movements intact.  Cardiovascular:     Rate and Rhythm: Normal rate and regular rhythm.     Pulses: Normal pulses.     Heart sounds:  Normal heart sounds.  Pulmonary:     Effort: Pulmonary effort is normal. No respiratory distress.     Breath sounds: Normal breath sounds.  Abdominal:     General: Abdomen is flat. Bowel sounds are normal.     Palpations: Abdomen is soft.  Musculoskeletal:        General: Normal range of motion.  Lymphadenopathy:     Cervical: No cervical adenopathy.  Skin:    General: Skin is warm and dry.     Findings: No rash.  Neurological:     General: No focal deficit present.     Mental Status: She is alert and oriented to person, place, and time. Mental status is at baseline.  Psychiatric:        Mood and Affect: Mood normal.        Behavior: Behavior normal.        Thought Content: Thought content normal.        Judgment: Judgment normal.     MR BRAIN W WO CONTRAST  Result Date: 02/27/2023  [facility] [address] [place], [place] [address] [phone] NEUROIMAGING REPORT STUDY DATE: 02/26/2023 PATIENT NAME: Shelby Dyer DOB: 1995/09/13 MRN: [id number] ORDERING CLINICIAN: Dr. Lucia Gaskins CLINICAL HISTORY: 27 year old  patient was evaluated for diplopia COMPARISON FILMS: None EXAM: MRI brain with and without contrast TECHNIQUE: Sagittal T1, axial T1, T2, FLAIR, DWI, ADC map, GRE, coronal T2 and postcontrast axial and coronal T1 images obtained through the brain CONTRAST: 20 mL IV MultiHance IMAGING SITE: [facility] at [address] FINDINGS: The brain parenchyma shows no abnormal signal intensities.  No structural lesion, tumor or infarct is noted.  Diffusion-weighted imaging negative for acute ischemia.  GRE sequences do not show significant microhemorrhages.  Subarachnoid spaces and medical system appear normal.  Cortical sulci and gyri show normal appearance.  Extra-axial ventricles appear normal.  Calvarium treatment abnormalities.  Orbits appear unremarkable.  Paranasal sinuses show mild chronic inflammatory changes.  The pituitary gland and cerebellar tonsils appear normal.  Visualized portion of the cervical spine shows no significant abnormalities.  The flow-voids of large vessel intracranial circulation appear to be patent.  Postcontrast images did not result in abnormal areas of enhancement.   Normal MRI scan of the brain with and without contrast. INTERPRETING PHYSICIAN: PRAMOD SETHI, MD Certified in  Neuroimaging by American Society of Neuroimaging and [employer] for Neurological Subspecialities   Recent Results (from the past 2160 hour(s))  Comp Met (CMET)     Status: Abnormal   Collection Time: 12/27/22  2:42 PM  Result Value Ref Range   Sodium 139 135 - 145 mEq/L   Potassium 4.2 3.5 - 5.1 mEq/L   Chloride 104 96 - 112 mEq/L   CO2 26 19 - 32 mEq/L   Glucose, Bld 99 70 - 99 mg/dL   BUN 5 (L) 6 - 23 mg/dL   Creatinine, Ser 2.95 0.40 - 1.20 mg/dL   Total Bilirubin 0.4 0.2 - 1.2 mg/dL   Alkaline Phosphatase 27 (L) 39 - 117 U/L   AST 18 0 - 37 U/L   ALT 11 0 - 35 U/L   Total Protein 6.5 6.0 - 8.3 g/dL   Albumin 4.2 3.5 - 5.2 g/dL   GFR 284.13 >24.40 mL/min    Comment: Calculated using the CKD-EPI  Creatinine Equation (2021)   Calcium 9.2 8.4 - 10.5 mg/dL  CBC w/Diff     Status: Abnormal   Collection Time: 12/27/22  2:42 PM  Result Value Ref Range   WBC 3.2 (L) 4.0 - 10.5 K/uL   RBC 4.02 3.87 - 5.11 Mil/uL   Hemoglobin 11.9 (L) 12.0 - 15.0 g/dL   HCT 75.6 43.3 - 29.5 %   MCV 89.8 78.0 - 100.0 fl   MCHC 32.9 30.0 - 36.0 g/dL   RDW 18.8 41.6 - 60.6 %   Platelets 192.0 150.0 - 400.0 K/uL   Neutrophils Relative % 46.6 43.0 - 77.0 %   Lymphocytes Relative 43.0 12.0 - 46.0 %   Monocytes Relative 8.1 3.0 - 12.0 %   Eosinophils Relative 1.0 0.0 - 5.0 %   Basophils Relative 1.3 0.0 - 3.0 %   Neutro Abs 1.5 1.4 - 7.7 K/uL   Lymphs Abs 1.4 0.7 - 4.0 K/uL   Monocytes Absolute 0.3 0.1 - 1.0 K/uL   Eosinophils Absolute 0.0 0.0 - 0.7 K/uL   Basophils Absolute 0.0 0.0 - 0.1 K/uL  Urinalysis w microscopic + reflex cultur     Status: None   Collection Time: 12/27/22  2:42 PM   Specimen: Blood  Result Value Ref Range   Color, Urine YELLOW YELLOW   APPearance CLEAR CLEAR   Specific Gravity, Urine 1.006 1.001 - 1.035   pH 7.0 5.0 - 8.0   Glucose, UA NEGATIVE NEGATIVE   Bilirubin Urine NEGATIVE NEGATIVE   Ketones, ur NEGATIVE NEGATIVE   Hgb urine dipstick NEGATIVE NEGATIVE   Protein, ur NEGATIVE NEGATIVE   Nitrites, Initial NEGATIVE NEGATIVE   Leukocyte Esterase NEGATIVE NEGATIVE   WBC, UA NONE SEEN 0 - 5 /HPF   RBC / HPF NONE SEEN 0 - 2 /HPF   Squamous Epithelial / HPF 0-5 < OR = 5 /HPF   Bacteria, UA NONE SEEN NONE SEEN /HPF   Hyaline Cast NONE SEEN NONE SEEN /LPF   Note      Comment: This urine was analyzed for the presence of WBC,  RBC, bacteria, casts, and other formed elements.  Only those elements seen were reported. . .   REFLEXIVE URINE CULTURE     Status: None   Collection Time: 12/27/22  2:42 PM  Result Value Ref Range   Reflexve Urine Culture      Comment: NO CULTURE INDICATED  TSH Rfx on Abnormal to Free T4     Status: None   Collection Time: 02/19/23  4:57 PM   Result Value Ref Range   TSH 1.770 0.450 - 4.500 uIU/mL  Hemoglobin A1c     Status: Abnormal   Collection Time: 02/19/23  4:57 PM  Result Value Ref Range   Hgb A1c MFr Bld 5.8 (H) 4.8 - 5.6 %    Comment:          Prediabetes: 5.7 - 6.4          Diabetes: >6.4          Glycemic control for adults with diabetes: <7.0    Est. average glucose Bld gHb Est-mCnc 120 mg/dL  T01 and Folate Panel     Status: None   Collection Time: 02/19/23  4:57 PM  Result Value Ref Range   Vitamin B-12 546 232 - 1,245 pg/mL   Folate 14.6 >3.0 ng/mL    Comment: A serum folate concentration of less than 3.1 ng/mL is considered to represent clinical deficiency.   Vitamin B1     Status: None   Collection Time: 02/19/23  4:57 PM  Result Value Ref Range   Thiamine 92.9 66.5 - 200.0 nmol/L  Vitamin B6     Status: None   Collection Time: 02/19/23  4:57 PM  Result Value Ref Range   Vitamin B6 26.2 3.4 - 65.2 ug/L    Comment:                              Deficiency:         <3.4                              Marginal:      3.4 - 5.1                              Adequate:           >5.1   Methylmalonic acid, serum     Status: None   Collection Time: 02/19/23  4:57 PM  Result Value Ref Range   Methylmalonic Acid 166 0 - 378 nmol/L  Angiotensin converting enzyme     Status: None   Collection Time: 02/19/23  4:57 PM  Result Value Ref Range   Angio Convert Enzyme 62 14 - 82 U/L  Sedimentation rate     Status: None   Collection Time: 02/19/23  4:57 PM  Result Value Ref Range   Sed Rate 2 0 - 32 mm/hr  Hepatitis C antibody     Status: None   Collection Time: 02/19/23  4:57 PM  Result Value Ref Range   Hep C Virus Ab Non Reactive Non Reactive    Comment: HCV antibody alone does not differentiate between previously resolved infection and active infection. Equivocal and Reactive HCV antibody results should be followed up with an HCV RNA test to support the diagnosis of active HCV infection.   Heavy metals,  blood     Status: None   Collection Time: 02/19/23  4:57 PM  Result Value Ref Range   Lead, Blood <1.0 0.0 - 3.4 ug/dL    Comment: Testing performed by Inductively coupled Administrator, Civil Service.                           Environmental Exposure:                            WHO Recommendation     <5.0                           Occupational Exposure:                            OSHA Lead Std          40.0                            BEI                    30.0                                 Detection Limit =  1.0    Arsenic 1 0 - 9 ug/L    Comment:  Detection Limit = 1   Mercury <1.0 0.0 - 14.9 ug/L    Comment:                                 Detection Limit =  1.0  Multiple Myeloma Panel (SPEP&IFE w/QIG)     Status: None   Collection Time: 02/19/23  4:57 PM  Result Value Ref Range   IgG (Immunoglobin G), Serum 1,164 586 - 1,602 mg/dL   IgA/Immunoglobulin A, Serum 195 87 - 352 mg/dL   IgM (Immunoglobulin M), Srm 50 26 - 217 mg/dL   Total Protein 6.9 6.0 - 8.5 g/dL   Albumin SerPl Elph-Mcnc 4.2 2.9 - 4.4 g/dL   Alpha 1 0.2 0.0 - 0.4 g/dL   Alpha2 Glob SerPl Elph-Mcnc 0.6 0.4 - 1.0 g/dL   B-Globulin SerPl Elph-Mcnc 0.9 0.7 - 1.3 g/dL   Gamma Glob SerPl Elph-Mcnc 1.0 0.4 - 1.8 g/dL   M Protein SerPl Elph-Mcnc Not Observed Not Observed g/dL   Globulin, Total 2.7 2.2 - 3.9 g/dL   Albumin/Glob SerPl 1.6 0.7 - 1.7   IFE 1 Comment     Comment: The immunofixation pattern appears unremarkable. Evidence of monoclonal protein is not apparent.    Please Note Comment     Comment: Protein electrophoresis scan will follow via computer, mail, or courier delivery.   Copper, serum     Status: None   Collection Time: 02/19/23  4:57 PM  Result Value Ref Range   Copper 91 80 - 158 ug/dL    Comment:                                 Detection Limit = 5  ANA Comprehensive Panel     Status: None   Collection Time: 02/19/23  4:57 PM  Result Value Ref Range   dsDNA Ab 1  0 - 9 IU/mL    Comment:                                    Negative      <5                                    Equivocal  5 - 9                                    Positive      >9    ENA RNP Ab <0.2 0.0 - 0.9 AI   ENA SM Ab Ser-aCnc <0.2 0.0 - 0.9 AI   Scleroderma (Scl-70) (ENA) Antibody, IgG <0.2 0.0 - 0.9 AI   ENA SSA (RO) Ab <0.2 0.0 - 0.9 AI   ENA SSB (LA) Ab <0.2 0.0 - 0.9 AI   Chromatin Ab SerPl-aCnc <0.2 0.0 - 0.9 AI   Anti JO-1 <0.2 0.0 - 0.9 AI   Centromere Ab Screen <0.2 0.0 - 0.9 AI   See below: Comment     Comment: Autoantibody                       Disease Association ------------------------------------------------------------  Condition                  Frequency ---------------------   ------------------------   --------- Antinuclear Antibody,    SLE, mixed connective Direct (ANA-D)           tissue diseases ---------------------   ------------------------   --------- dsDNA                    SLE                        40 - 60% ---------------------   ------------------------   --------- Chromatin                Drug induced SLE                90%                          SLE                        48 - 97% ---------------------   ------------------------   --------- SSA (Ro)                 SLE                        25 - 35%                          Sjogren's Syndrome         40 - 70%                          Neonatal Lupus                 100% ---------------------   ------------------------   --------- SSB (La)                 SLE                              10%                          Sjogren's Syndrome              30% ---------------------   -----------------------    --------- Sm (anti-Smith)          SLE                        15 - 30% ---------------------   -----------------------    --------- RNP                      Mixed Connective Tissue                          Disease                         95% (U1 nRNP,                 SLE                        30 -  50% anti-ribonucleoprotein)  Polymyositis and/or                          Dermatomyositis                 20% ---------------------   ------------------------   --------- Scl-70 (antiDNA          Scleroderma (diffuse)      20 - 35% topoisomerase)           Crest                           13% ---------------------   ------------------------   --------- Jo-1                     Polymyositis and/or                          Dermatomyositis            20 - 40% ---------------------   ------------------------   --------- Centromere B             Scleroderma -  Crest                          variant                         80%   Vitamin D, 25-hydroxy     Status: Abnormal   Collection Time: 02/19/23  4:57 PM  Result Value Ref Range   Vit D, 25-Hydroxy 29.8 (L) 30.0 - 100.0 ng/mL    Comment: Vitamin D deficiency has been defined by the Institute of Medicine and an Endocrine Society practice guideline as a level of serum 25-OH vitamin D less than 20 ng/mL (1,2). The Endocrine Society went on to further define vitamin D insufficiency as a level between 21 and 29 ng/mL (2). 1. IOM (Institute of Medicine). 2010. Dietary reference    intakes for calcium and D. [place]: The    [employer]. 2. Holick MF, Binkley Odin, Bischoff-Ferrari HA, et al.    Evaluation, treatment, and prevention of vitamin D    deficiency: an Endocrine Society clinical practice    guideline. JCEM. 2011 Jul; 96(7):1911-30.         Garner Nash, MD, MS

## 2023-05-02 ENCOUNTER — Ambulatory Visit: Payer: 59 | Admitting: Family Medicine

## 2023-05-02 ENCOUNTER — Encounter: Payer: Self-pay | Admitting: Dietician

## 2023-05-02 ENCOUNTER — Encounter: Payer: 59 | Attending: Family Medicine | Admitting: Dietician

## 2023-05-02 VITALS — Wt 203.0 lb

## 2023-05-02 DIAGNOSIS — R7303 Prediabetes: Secondary | ICD-10-CM | POA: Diagnosis present

## 2023-05-02 NOTE — Progress Notes (Signed)
 Medical Nutrition Therapy  Appointment Start time:  1640  Appointment End time:  1750  Primary concerns today: unintentional weight loss, GI issues, limited food intake   Referral diagnosis: prediabetes Preferred learning style: no preference indicated Learning readiness: ready   NUTRITION ASSESSMENT   Anthropometrics   Wt: 203 lb  Clinical Medical Hx: POTS, prediabetes, allergies, anemia, anxiety, asthma, depression, mast cell activation syndrome, Hypermobile Ehlers-Danlos syndrome, migraines Medications: reviewed Labs: 02/19/23: A1c 5.8%, vitamin D  29.8. 12/27/22: hemoglobin 11.9.  Notable Signs/Symptoms: abdominal pain, constipation, slow motility,  Food Allergies: mango  Lifestyle & Dietary Hx  Pt is present today with her partner and medical service dog.  Pt states she has been experiencing a number of GI and health issues since 2022. Pt reports she was diagnosed with POTS, mast cell activation syndrome, and chronic idiopathic constipation in 2022, when she began to notice her GI symptoms including early satiety, abdominal pain, and nausea. Pt states at her highest weight she was around 310 lb and dropped to 240 lb by June 2023. At this point she noticed her symptoms greatly improved, and was able to gain about 10-20 lb back. Pt reports in September 2024, her symptoms became progressively worse (upper GI pain, occasionally pain in other areas, nausea, pain after eating), and she reports she was unable to eat anything solid. Pt states she has been working with GI, including a upper and lower GI series, and gastric emptying. Pt reports testing negative for celiac disease, crohn's disease, SIBO, and ulcerative colitis. Along with these GI concerns pt experiences chronic constipation and reports taking 2-4 servings miralax on-and-off for the last 8 months. Pt reports there have been times where she did not have a bowel movement for 1 month. Pt reports she may have a bowel movement 1x/wk,  usually type 1-2, with occasional type 7.   Pt states since September 2024 she has mostly been eating soft/liquid foods including beverages, pudding, homemade silken tofu mousse, Mallie Pinion nutrition supplement, ice cream, soft cheese, [employer]era tomato or potato soup, and broths (see tolerated foods list below). Pt reports she has also been able to eat some mashed potatoes if they are soft/stewy.   Pt states she has been avoiding all grains. Pt reports she had a ritz cracker and went into anaphylaxis. Pt states she dealt with anaphylaxis once a month for a while due to Mast Cell Activation Syndrome, and carries and epi-pen.   Pt reports likely due to low intake of nutrients she has also been experiencing some fatigue and hair loss.   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 Pt reports an active job averaging 8000-10000 steps daily with lifting animals. Pt states for stress relief she enjoys her hobbies of crocheting, walking, reading, and playing video games with her partner.   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 Pt states she has no concerns with eating disorder history at this point.   Tolerated Foods/Beverages Grains/starches: soft mashed potatoes, potato leek soup Proteins: silken tofu, soft cheese, milk Fruits: Vegetables: tomato soup,  Dairy: 2% milk, soft cheese, ice cream Other: cocoa powder, simple candy (jolly ranchers), jello pudding (SF and regular), broth Beverages: liquid IV, Gatorade zero/regular, diet/regular soda, Mallie Pinion  Estimated daily fluid intake: 2-3L water Supplements: CO-Q10, miralax, MVI, omega 3, vitamin C, riboflavin, calcium-vitamin D , quercetin,  Sleep: 7-10 hours Stress /  self-care: high stress related to medical conditions and work.  Current average weekly physical activity: walking outside twice a week,  active job.   24-Hr Dietary Recall First Meal: 7am: homemade latte with 2% milk and light sweetener, 1/3 c silken tofu mousse  Snack: 11am: jello pudding (sometimes SF) Second Meal: 12pm: zero sugar soda/gatorade Snack: candy Third Meal: panera tomato soup or soft cheese (fresh moz) Snack: occasional ice cream Beverages: 2-3 L water, gatorade zero, diet soda, 1 k[employer] farms, liquid IV   NUTRITION DIAGNOSIS  [place]-1.4 Altered GI function As related to changes in GI tract motility.  As evidenced by pt report of abdominal pain, nausea, early satiety, constipation, etc .   NUTRITION INTERVENTION  Nutrition education (E-1) on the following topics:   Understanding Altered GI Function and Constipation  Altered GI Function: This can refer to a variety of conditions affecting the digestive system, such as slow gastric emptying, malabsorption, motility issues, or constipation. Constipation, specifically, refers to infrequent, difficult, or painful bowel movements and can be caused by a range of factors, including poor diet, dehydration, lack of physical activity, or certain medications.  Constipation Symptoms:  Common symptoms include: - Infrequent bowel movements (less than 3 times per week) - Straining during bowel movements - Hard or lumpy stools - Feeling of incomplete evacuation - Abdominal discomfort or bloating  Improving Constipation Symptoms  - Increase Dietary Fiber  Fiber helps form soft, bulky stools. There are two main types of fiber:   - Soluble Fiber: Found in oats, beans, apples, and citrus fruits. It absorbs water and helps form stool.   - Insoluble Fiber: Found in whole grains, vegetables, and the skin of fruits. It helps move stool through the digestive tract more quickly.  Gradual Increase in Fiber:   Increase fiber intake slowly to prevent bloating and gas. Aim for a gradual increase, as sudden spikes can worsen symptoms in sensitive individuals.  - Hydration:   Fiber  requires water to function properly. Encourage individuals to drink plenty of fluids (water, herbal teas, etc.) to keep stools soft.  - Healthy Fats:   Incorporating healthy fats like avocado, olive oil, and nuts can lubricate the intestines and make it easier to pass stool.  Adding One Food at a Time  When helping someone improve their digestion and relieve constipation, introducing one food at a time is key to tracking its effects and avoiding overwhelming the digestive system. Here's a step-by-step approach:  - Start with Soluble Fiber:   Introduce foods rich in soluble fiber (e.g., oats, apples) first, as they tend to be gentler on the gut. These foods absorb water, which can soften stool without causing excessive gas.  - Track Responses:   After adding each food, track how the individual's symptoms respond. Some may feel relief after introducing high-fiber fruits, while others may not experience immediate changes.  - Increase Slowly:   Once a food is tolerated, try to increase the portion size gradually. For example, start with a small portion of oats, then gradually increase the serving size over several days or weeks.  Tracking Symptoms  Keeping a food and symptom diary can be incredibly helpful in identifying which foods improve or worsen symptoms. Here's how to guide tracking:  - Daily Entries:     - What foods did they eat? (and how much)   - How much water did they drink?   - Did they experience constipation, bloating, or discomfort?   - What was the frequency and  ease of bowel movements?  - Note Any Changes:   Any noticeable change in symptoms (positive or negative) should be recorded. For example, if they added more fiber and noticed easier bowel movements, that's valuable information. Alternatively, if adding a particular food caused bloating or gas, that's important to note.  - Assess Over Time:   Tracking symptoms over a period of weeks can help identify patterns and  give insight into what foods and habits contribute to improved GI function or worsening symptoms. This can also provide a clearer picture of how fiber intake impacts bowel movements.  Behavioral Considerations  - Regular Physical Activity:   Exercise, even a daily walk, can stimulate bowel movements and improve overall digestion.    - Stress Management:   Stress can affect digestion, so it's important to focus on relaxation techniques like yoga, meditation, or deep breathing exercises.  - Consistency:   Making these changes consistently is key to improvement.   Handouts Provided Include  No handouts provided on this follow up  Samples Provided: Mallie Pinion peptide 1.0 and 1.5.  Learning Style & Readiness for Change Teaching method utilized: Visual & Auditory  Demonstrated degree of understanding via: Teach Back  Barriers to learning/adherence to lifestyle change: none  Goals Established by Pt  Lets start with these things we discussed today. Make sure to monitor any symptoms when incorporating a new food. Our ultimate goal is to find any way to increase calories/protein in general, including more of the foods you already tolerate.   Add 1 T peanut butter to your pudding.  Have 1 c milk while at work.   Try [employer] milk.   Bring some of your tofu dish to work.   Try blending silken tofu into your tomato soup.  Try adding collagen to your soup.   Try blending a banana with a cup of milk.   Try adding avocado to your mousse.   Keep track of any symptoms after new foods.   MONITORING & EVALUATION Dietary intake, weekly physical activity, and follow up in 4 weeks.  Next Steps  Patient is to call for questions.

## 2023-05-02 NOTE — Patient Instructions (Signed)
 Goals Established by Pt  Add 1 T peanut butter to your pudding.  Have 1 c milk while at work.   Try [employer] milk.   Bring some of your tofu dish to work.   Try blending silken tofu into your tomato soup.  Try adding collagen to your soup.   Try blending a banana with a cup of milk.   Try adding avocado to your mousse.   Keep track of any symptoms after new foods.

## 2023-05-09 ENCOUNTER — Ambulatory Visit: Payer: 59 | Admitting: Family Medicine

## 2023-05-09 VITALS — BP 122/78 | HR 74 | Temp 97.5°F | Wt 200.8 lb

## 2023-05-09 DIAGNOSIS — E441 Mild protein-calorie malnutrition: Secondary | ICD-10-CM | POA: Diagnosis not present

## 2023-05-09 DIAGNOSIS — Z4659 Encounter for fitting and adjustment of other gastrointestinal appliance and device: Secondary | ICD-10-CM

## 2023-05-09 DIAGNOSIS — K3189 Other diseases of stomach and duodenum: Secondary | ICD-10-CM | POA: Diagnosis not present

## 2023-05-09 DIAGNOSIS — D508 Other iron deficiency anemias: Secondary | ICD-10-CM | POA: Diagnosis not present

## 2023-05-09 NOTE — Progress Notes (Signed)
Assessment/Plan:     Severe Weight Loss and Malnutrition Significant weight loss of 50 pounds over six months despite nutritional supplements, likely secondary to GI motility dysfunction and/or presumed mast cell disease. Currently on Amitiza and awaiting insurance approval for [employer]. Considering G-tube placement due to severe malnutrition. Discussed benefits and risks of G-tube. Patient expressed concerns about work-related bacterial exposure but acknowledged the importance of addressing health needs. - Refer to [facility] surgery for possible G-tube placement - Prescribe DME for [employer] (3 per day) - Continue to follow with gastroenterology for motility assessment and management - Metabolic profile to assess protein levels - Encourage continued oral intake and monitor weight at home  Leg Swelling with Mild Pitting Edema Bilateral mild pitting edema in lower calves and feet noted over the past few days. No recent medication or significant dietary changes. No associated pain, chest pain, or shortness of breath. Likely multifactorial, possibly related to multiple medications. No signs of acute DVT or significant lymphedema. Discussed potential use of compression stockings if swelling progresses. - Monitor leg swelling - Advise leg elevation - Consider compression stockings if swelling progresses  Anemia Mild anemia with hemoglobin of 11.3 in December, down from 11.9 in October. B12, ESR, CRP, and heavy metals were normal. Suspected nutritional or malabsorptive cause. Discussed need for further evaluation with iron studies, B12, and folate. 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 Currently on multiple medications for management. Discussed importance of monitoring for new symptoms or exacerbations and continuing current treatment regimen. - Continue  current medications - Monitor for new symptoms or exacerbations  Follow-up - Follow up for weight in six months - Perform lab work including CBC, iron studies, and metabolic panel.       Medications Discontinued During This Encounter  Medication Reason   Nutritional Supplements (PEDIASURE PEPTIDE 1.0 CAL) LIQD    Pancreatin 325 MG TABS    medium chain triglycerides (MCT OIL) oil Reorder    No follow-ups on file.    Subjective:   Encounter date: 05/09/2023  Shelby Dyer is a 28 y.o. female who has Hypermobility syndrome; Dysautonomia orthostatic hypotension syndrome; Mast cell activation (HCC); Chronic migraine without aura, with intractable migraine, so stated, with status migrainosus; Inappropriate sinus tachycardia (HCC); Median nerve neuritis, right; Idiopathic hypersomnolence; IBS (irritable bowel syndrome); Hip pain; Chronic idiopathic constipation; Ehlers-Danlos syndrome; PTSD (post-traumatic stress disorder); Patellofemoral pain syndrome of both knees; Myofascial pain syndrome, cervical; Needle stick, hypodermic, accidental, initial encounter; Foreign body of left index finger with infection; Chronic pain of multiple joints; Class 2 obesity in adult; Migraine with aura and with status migrainosus, not intractable; Unintended weight loss; Generalized intestinal dysmotility; and Prediabetes on their problem list..   She  has a past medical history of Allergy (2000), Anemia (2020), Anxiety, Asthma (Early 2021), Chronic idiopathic constipation, Chronic migraine with aura, Depression (2012?), Hypermobile Ehlers-Danlos syndrome, Idiopathic hypersomnolence, Mast cell activation syndrome (HCC), Myofascial pain syndrome, cervical (11/14/2021), Peripheral neuropathy, POTS (postural orthostatic tachycardia syndrome), and Ulcer (2018)..   She presents with chief complaint of Medical Management of Chronic Issues (Edema in lower legs x few nights ago. Discuss appt w/ gi +/- repeat bw (cbc/Chem  in Oct and Dec. Discuss what shakes she can have prescribed. ) .   Discussed the use of AI scribe software for clinical note transcription with the patient, who gave verbal consent to proceed.  History of Present Illness   Shelby Dyer is a 28 year old female who presents with leg swelling and nutritional concerns.  She has experienced mild pitting edema in her lower calves and feet for the past three days. Her legs have always been larger, but the swelling became noticeable after ambulation. There is no new pain in her legs, although she experiences some discomfort due to her chronic illness. No recent changes in medication, except for completing a course of Macrobid for a UTI over a week ago. No chest pain or shortness of breath. Elevating her legs improves the swelling to some extent.  She has significant nutritional concerns, having lost 56 pounds over six months, dropping from 256 to 200 pounds, despite using nutritional supplements like [employer]. She consumes two shakes per day. She continues to experience gastrointestinal difficulties, and a CT scan previously showed stool burden despite being on a liquid diet for four months. A past gastric emptying study was normal, and multiple MRIs and celiac disease testing have been conducted.  She has a history of mild anemia, with a CBC in December showing hemoglobin at 11.3, slightly decreased from 11.9 in October. B12, ESR, CRP, and heavy metal exposure tests were normal. She has not had an iron evaluation recently.  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 She is on low-dose naltrexone for pain management, which she finds helpful for background pain. She had to discontinue it temporarily while on Macrobid due to potential neurological interactions, during which she noticed an increase in background pain. She is also on Mirena for birth control.         Past Surgical History:   Procedure Laterality Date   adenoids removed     double scope      Tub place in both ear  Bilateral    When she was 28 years of age   WISDOM TOOTH EXTRACTION  2006   All 4    Outpatient Medications Prior to Visit  Medication Sig Dispense Refill   acetaminophen (TYLENOL) 500 MG tablet Take 1,000 mg by mouth as needed.     Ascorbic Acid (VITAMIN C PO) Take 500-2,000 mg by mouth daily in the afternoon.     atenolol (TENORMIN) 25 MG tablet Take by mouth daily. 25 mg in the morning and 12.5 mg mid day.     cetirizine (ZYRTEC) 10 MG tablet Take 20 mg by mouth in the morning and at bedtime.     Coenzyme Q10 (COQ-10 PO) Take 400 mg by mouth daily at 6 (six) AM.     cromolyn (GASTROCROM) 100 MG/5ML solution Take 200-800 mg by mouth 4 (four) times daily.     cromolyn (NASALCROM) 5.2 MG/ACT nasal spray 1-2 sprays as needed.     EPINEPHrine (PRIMATENE MIST) 0.125 MG/ACT AERO 1-2 puffs as needed.     EPINEPHrine 0.3 mg/0.3 mL IJ SOAJ injection Inject 0.3 mg into the muscle as needed for anaphylaxis.     famotidine (PEPCID) 20 MG tablet Take 20 mg by mouth 2 (two) times daily.     gabapentin (NEURONTIN) 100 MG capsule Take 100 mg by mouth 3 (three) times daily.     Galcanezumab-gnlm (EMGALITY) 120 MG/ML SOAJ Inject 120 mg into the skin every 30 (thirty) days. PLEASE USE COPAY CARD BIN: [id number] PCN [id number] GRP [id number] ID [id number] Expires 03/26/2023 3 mL 11   hydroxychloroquine (PLAQUENIL) 200 MG tablet Take 200 mg by mouth 2 (two)  times daily.     hydrOXYzine (ATARAX) 25 MG tablet Take 25-50 mg by mouth every 8 (eight) hours as needed (allergies).     hyoscyamine (LEVSIN) 0.125 MG tablet Take 1 tablet (0.125 mg total) by mouth every 4 (four) hours as needed for cramping (abdominal discomfort). 30 tablet 0   levonorgestrel (MIRENA) 20 MCG/24HR IUD 1 each by Intrauterine route once.     lubiprostone (AMITIZA) 8 MCG capsule Take 8 mcg by mouth 2 (two) times daily with a meal.     Multiple Vitamin  (MULTIVITAMIN) tablet Take 1 tablet by mouth daily.     Multiple Vitamins-Minerals (ZINC PO) Take by mouth.     Naltrexone HCl POWD 1 mg by mouth daily for 30 days     Naltrexone HCl, Pain, 1.5 MG CAPS Take 2 mg by mouth daily.     nortriptyline (PAMELOR) 25 MG capsule Take 75 mg by mouth daily after supper.     Omega-3 Fatty Acids (FISH OIL PO) Take by mouth daily at 6 (six) AM.     ondansetron (ZOFRAN) 4 MG tablet Take 1 tablet (4 mg total) by mouth every 8 (eight) hours as needed for nausea or vomiting. 20 tablet 0   Polyethylene Glycol 3350 (MIRALAX PO) Take by mouth.     pyridostigmine (MESTINON) 60 MG tablet Take 60 mg by mouth in the morning and at bedtime.     QUERCETIN PO Take 3,000 mg by mouth daily in the afternoon.     Riboflavin 100 MG TABS Take 200 mg by mouth 2 (two) times daily.     Rimegepant Sulfate (NURTEC) 75 MG TBDP Take 1 tablet (75 mg total) by mouth daily as needed. For migraines. Take as close to onset of migraine as possible. One daily maximum. 6 tablet 0   Ubrogepant (UBRELVY) 100 MG TABS Take 1 tablet (100 mg total) by mouth every 2 (two) hours as needed. Maximum 200mg  a day. Please use copay card: BIN [id number] PCN [id number] GRP [id number] ID [id number] 16 tablet 11   UNABLE TO FIND Take by mouth 2 (two) times daily. Med Name: MAGNESIUM COMPLEX  200 mg AM and 400 mg PM     UNABLE TO FIND Take 1,000 mg by mouth daily. Med Name: VITASSIUM brand salt capsules     XOLAIR 150 MG/ML prefilled syringe Inject 300 mg into the skin every 14 (fourteen) days.     medium chain triglycerides (MCT OIL) oil Take 15 mLs by mouth daily. 473 mL 11   Nutritional Supplements (PEDIASURE PEPTIDE 1.0 CAL) LIQD 1 each by Enteral route 3 (three) times daily as needed.     Pancreatin 325 MG TABS Take 1 tablet (325 mg total) by mouth 3 (three) times daily with meals. 90 tablet 11   No facility-administered medications prior to visit.    Family History  Problem Relation Age of Onset   Migraines  Mother    Anxiety disorder Mother    Depression Mother    Ehlers-Danlos syndrome Mother    Atrial fibrillation Father    Other Sister    Anxiety disorder Sister    Depression Sister    Learning disabilities Sister    Other Sister        POTS   Hypertension Brother    Asthma Brother    Learning disabilities Brother    Depression Brother    Hypertension Brother    Learning disabilities Brother    Obesity Brother    Migraines Maternal Grandmother  Kidney disease Maternal Grandfather    Stroke Maternal Grandfather    Breast cancer Paternal Grandmother    Cancer Paternal Grandmother    Varicose Veins Paternal Grandmother    Cancer Paternal Grandfather    Early death Maternal Aunt    Colon cancer Neg Hx    Stomach cancer Neg Hx    Liver cancer Neg Hx    Pancreatic cancer Neg Hx     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  Tobacco Use   Smoking status: Former    Current packs/day: 0.00    Average packs/day: 0.5 packs/day for 2.0 years (1.0 ttl pk-yrs)    Types: Cigarettes    Start date: 03/26/2014    Quit date: 03/26/2016    Years since quitting: 7.1   Smokeless tobacco: Never  Vaping Use   Vaping status: Never Used  Substance and Sexual Activity   Alcohol use: Yes    Alcohol/week: 1.0 - 2.0 standard drink of alcohol    Types: 1 - 2 Standard drinks or equivalent per week    Comment: Very rarely drink cocktail   Drug use: Not Currently    Types: Marijuana    Comment: Used to smoke in college   Sexual activity: Yes    Birth control/protection: I.U.D.    Comment: 1 partner - my husband  Other Topics Concern   Not on file  Social History Narrative   Caffeine: none   Social Drivers of Manufacturing engineer Strain: Low Risk  (05/09/2023)   Overall Financial Resource Strain (CARDIA)    Difficulty of Paying Living Expenses: Not very hard  Food Insecurity: No Food Insecurity (05/09/2023)   Hunger Vital Sign    Worried About Running Out of Food in the Last Year: Never true    Ran Out of Food in the Last Year: Never true  Transportation Needs: No Transportation Needs (05/09/2023)   PRAPARE - Administrator, Civil Service (Medical): No    Lack of Transportation (Non-Medical): No  Physical Activity: Sufficiently Active (05/09/2023)   Exercise Vital Sign    Days of Exercise per Week: 5 days    Minutes of Exercise per Session: 30 min  Recent Concern: Physical Activity - Insufficiently Active (02/25/2023)   Exercise Vital Sign    Days of Exercise per Week: 3 days    Minutes of Exercise per Session: 30 min  Stress: No Stress Concern Present (05/09/2023)   [employer] of Occupational Health - Occupational Stress Questionnaire    Feeling of Stress : Only a little  Social Connections: Socially Integrated (05/09/2023)   Social Connection and Isolation Panel [NHANES]    Frequency of Communication with Friends and Family: More than three times a week    Frequency of Social Gatherings with Friends and Family: More than three times a week    Attends Religious Services: More than 4 times per year    Active Member of [employer] or Organizations: Yes    Attends Banker Meetings: 1 to 4 times per year    Marital Status: Married  Catering manager Violence: Not At Risk (02/14/2022)   Received from [employer], [facility]  HITS    Over the last 12 months how often did your partner physically hurt you?: Never    Over the last 12 months how often did your partner insult you or talk down to you?: Never    Over the last 12 months how often did your partner threaten you with physical harm?: Never    Over the last 12 months how often did your partner scream or curse at  you?: Never                                                                                                  Objective:  Physical Exam: BP 122/78   Pulse 74   Temp (!) 97.5 F (36.4 C) (Temporal)   Wt 200 lb 12.8 oz (91.1 kg)   SpO2 99%   BMI 29.65 kg/m    Wt Readings from Last 3 Encounters:  05/09/23 200 lb 12.8 oz (91.1 kg)  05/03/23 203 lb (92.1 kg)  03/04/23 213 lb 12.8 oz (97 kg)    Physical Exam Constitutional:      General: She is not in acute distress.    Appearance: Normal appearance. She is not ill-appearing or toxic-appearing.  HENT:     Head: Normocephalic and atraumatic.     Nose: Nose normal. No congestion.  Eyes:     General: No scleral icterus.    Extraocular Movements: Extraocular movements intact.  Cardiovascular:     Rate and Rhythm: Normal rate and regular rhythm.     Pulses: Normal pulses.     Heart sounds: Normal heart sounds.     Comments: Trace Edema bilaterally at the ankle Pulmonary:     Effort: Pulmonary effort is normal. No respiratory distress.     Breath sounds: Normal breath sounds.  Abdominal:     General: Abdomen is flat. Bowel sounds are normal.     Palpations: Abdomen is soft.  Musculoskeletal:        General: Normal range of motion.     Right lower leg: Edema present.     Left lower leg: Edema present.  Lymphadenopathy:     Cervical: No cervical adenopathy.  Skin:    General: Skin is warm and dry.     Findings: No rash.  Neurological:     General: No focal deficit present.     Mental Status: She is alert and oriented to person, place, and time. Mental status is at baseline.  Psychiatric:        Mood and Affect: Mood normal.        Behavior: Behavior normal.        Thought Content: Thought content normal.        Judgment: Judgment normal.   Trace edema  MR BRAIN W WO CONTRAST Result Date: 02/27/2023  [facility] [address], [address] [place], [place] [address] [phone] NEUROIMAGING REPORT STUDY  DATE: 02/26/2023 PATIENT NAME: Shelby Dyer DOB: [birth year] MRN: [id number] ORDERING CLINICIAN: Dr. Lucia Gaskins CLINICAL HISTORY: 28 year old patient was evaluated for diplopia COMPARISON FILMS: None EXAM: MRI brain with and without contrast TECHNIQUE: Sagittal T1,  axial T1, T2, FLAIR, DWI, ADC map, GRE, coronal T2 and postcontrast axial and coronal T1 images obtained through the brain CONTRAST: 20 mL IV MultiHance IMAGING SITE: [facility] at [address] FINDINGS: The brain parenchyma shows no abnormal signal intensities.  No structural lesion, tumor or infarct is noted.  Diffusion-weighted imaging negative for acute ischemia.  GRE sequences do not show significant microhemorrhages.  Subarachnoid spaces and medical system appear normal.  Cortical sulci and gyri show normal appearance.  Extra-axial ventricles appear normal.  Calvarium treatment abnormalities.  Orbits appear unremarkable.  Paranasal sinuses show mild chronic inflammatory changes.  The pituitary gland and cerebellar tonsils appear normal.  Visualized portion of the cervical spine shows no significant abnormalities.  The flow-voids of large vessel intracranial circulation appear to be patent.  Postcontrast images did not result in abnormal areas of enhancement.   Normal MRI scan of the brain with and without contrast. INTERPRETING PHYSICIAN: PRAMOD SETHI, MD Certified in  Neuroimaging by American Society of Neuroimaging and [employer] for Neurological Subspecialities   Recent Results (from the past 2160 hours)  TSH Rfx on Abnormal to Free T4     Status: None   Collection Time: 02/19/23  4:57 PM  Result Value Ref Range   TSH 1.770 0.450 - 4.500 uIU/mL  Hemoglobin A1c     Status: Abnormal   Collection Time: 02/19/23  4:57 PM  Result Value Ref Range   Hgb A1c MFr Bld 5.8 (H) 4.8 - 5.6 %    Comment:          Prediabetes: 5.7 - 6.4          Diabetes: >6.4          Glycemic control for adults with diabetes: <7.0    Est. average glucose Bld  gHb Est-mCnc 120 mg/dL  Z61 and Folate Panel     Status: None   Collection Time: 02/19/23  4:57 PM  Result Value Ref Range   Vitamin B-12 546 232 - 1,245 pg/mL   Folate 14.6 >3.0 ng/mL    Comment: A serum folate concentration of less than 3.1 ng/mL is considered to represent clinical deficiency.   Vitamin B1     Status: None   Collection Time: 02/19/23  4:57 PM  Result Value Ref Range   Thiamine 92.9 66.5 - 200.0 nmol/L  Vitamin B6     Status: None   Collection Time: 02/19/23  4:57 PM  Result Value Ref Range   Vitamin B6 26.2 3.4 - 65.2 ug/L    Comment:                              Deficiency:         <3.4                              Marginal:      3.4 - 5.1                              Adequate:           >5.1   Methylmalonic acid, serum     Status: None   Collection Time: 02/19/23  4:57 PM  Result Value Ref Range   Methylmalonic Acid 166 0 - 378 nmol/L  Angiotensin converting enzyme     Status: None   Collection  Time: 02/19/23  4:57 PM  Result Value Ref Range   Angio Convert Enzyme 62 14 - 82 U/L  Sedimentation rate     Status: None   Collection Time: 02/19/23  4:57 PM  Result Value Ref Range   Sed Rate 2 0 - 32 mm/hr  Hepatitis C antibody     Status: None   Collection Time: 02/19/23  4:57 PM  Result Value Ref Range   Hep C Virus Ab Non Reactive Non Reactive    Comment: HCV antibody alone does not differentiate between previously resolved infection and active infection. Equivocal and Reactive HCV antibody results should be followed up with an HCV RNA test to support the diagnosis of active HCV infection.   Heavy metals, blood     Status: None   Collection Time: 02/19/23  4:57 PM  Result Value Ref Range   Lead, Blood <1.0 0.0 - 3.4 ug/dL    Comment: Testing performed by Inductively coupled Administrator, Civil Service.                           Environmental Exposure:                            WHO Recommendation     <5.0                           Occupational  Exposure:                            OSHA Lead Std          40.0                            BEI                    30.0                                 Detection Limit =  1.0    Arsenic 1 0 - 9 ug/L    Comment:                                 Detection Limit = 1   Mercury <1.0 0.0 - 14.9 ug/L    Comment:                                 Detection Limit =  1.0  Multiple Myeloma Panel (SPEP&IFE w/QIG)     Status: None   Collection Time: 02/19/23  4:57 PM  Result Value Ref Range   IgG (Immunoglobin G), Serum 1,164 586 - 1,602 mg/dL   IgA/Immunoglobulin A, Serum 195 87 - 352 mg/dL   IgM (Immunoglobulin M), Srm 50 26 - 217 mg/dL   Total Protein 6.9 6.0 - 8.5 g/dL   Albumin SerPl Elph-Mcnc 4.2 2.9 - 4.4 g/dL   Alpha 1 0.2 0.0 - 0.4 g/dL   Alpha2 Glob SerPl Elph-Mcnc 0.6 0.4 - 1.0 g/dL   B-Globulin SerPl Elph-Mcnc 0.9 0.7 - 1.3 g/dL   Gamma Glob SerPl Elph-Mcnc 1.0 0.4 - 1.8 g/dL  M Protein SerPl Elph-Mcnc Not Observed Not Observed g/dL   Globulin, Total 2.7 2.2 - 3.9 g/dL   Albumin/Glob SerPl 1.6 0.7 - 1.7   IFE 1 Comment     Comment: The immunofixation pattern appears unremarkable. Evidence of monoclonal protein is not apparent.    Please Note Comment     Comment: Protein electrophoresis scan will follow via computer, mail, or courier delivery.   Copper, serum     Status: None   Collection Time: 02/19/23  4:57 PM  Result Value Ref Range   Copper 91 80 - 158 ug/dL    Comment:                                 Detection Limit = 5  ANA Comprehensive Panel     Status: None   Collection Time: 02/19/23  4:57 PM  Result Value Ref Range   dsDNA Ab 1 0 - 9 IU/mL    Comment:                                    Negative      <5                                    Equivocal  5 - 9                                    Positive      >9    ENA RNP Ab <0.2 0.0 - 0.9 AI   ENA SM Ab Ser-aCnc <0.2 0.0 - 0.9 AI   Scleroderma (Scl-70) (ENA) Antibody, IgG <0.2 0.0 - 0.9 AI   ENA SSA (RO) Ab <0.2 0.0 -  0.9 AI   ENA SSB (LA) Ab <0.2 0.0 - 0.9 AI   Chromatin Ab SerPl-aCnc <0.2 0.0 - 0.9 AI   Anti JO-1 <0.2 0.0 - 0.9 AI   Centromere Ab Screen <0.2 0.0 - 0.9 AI   See below: Comment     Comment: Autoantibody                       Disease Association ------------------------------------------------------------                         Condition                  Frequency ---------------------   ------------------------   --------- Antinuclear Antibody,    SLE, mixed connective Direct (ANA-D)           tissue diseases ---------------------   ------------------------   --------- dsDNA                    SLE                        40 - 60% ---------------------   ------------------------   --------- Chromatin                Drug induced SLE                90%  SLE                        48 - 97% ---------------------   ------------------------   --------- SSA (Ro)                 SLE                        25 - 35%                          Sjogren's Syndrome         40 - 70%                          Neonatal Lupus                 100% ---------------------   ------------------------   --------- SSB (La)                 SLE                              10%                          Sjogren's Syndrome              30% ---------------------   -----------------------    --------- Sm (anti-Smith)          SLE                        15 - 30% ---------------------   -----------------------    --------- RNP                      Mixed Connective Tissue                          Disease                         95% (U1 nRNP,                SLE                        30 - 50% anti-ribonucleoprotein)  Polymyositis and/or                          Dermatomyositis                 20% ---------------------   ------------------------   --------- Scl-70 (antiDNA          Scleroderma (diffuse)      20 - 35% topoisomerase)           Crest                            13% ---------------------   ------------------------   --------- Jo-1                     Polymyositis and/or  Dermatomyositis            20 - 40% ---------------------   ------------------------   --------- Centromere B             Scleroderma -  Crest                          variant                         80%   Vitamin D, 25-hydroxy     Status: Abnormal   Collection Time: 02/19/23  4:57 PM  Result Value Ref Range   Vit D, 25-Hydroxy 29.8 (L) 30.0 - 100.0 ng/mL    Comment: Vitamin D deficiency has been defined by the Institute of Medicine and an Endocrine Society practice guideline as a level of serum 25-OH vitamin D less than 20 ng/mL (1,2). The Endocrine Society went on to further define vitamin D insufficiency as a level between 21 and 29 ng/mL (2). 1. IOM (Institute of Medicine). 2010. Dietary reference    intakes for calcium and D. [place]: The    [employer]. 2. Holick MF, [place], Bischoff-Ferrari HA, et al.    Evaluation, treatment, and prevention of vitamin D    deficiency: an Endocrine Society clinical practice    guideline. JCEM. 2011 Jul; 96(7):1911-30.   CBC w/Diff     Status: None   Collection Time: 05/09/23  3:50 PM  Result Value Ref Range   WBC 4.9 4.0 - 10.5 K/uL   RBC 4.26 3.87 - 5.11 Mil/uL   Hemoglobin 12.5 12.0 - 15.0 g/dL   HCT 32.9 51.8 - 84.1 %   MCV 89.6 78.0 - 100.0 fl   MCHC 32.7 30.0 - 36.0 g/dL   RDW 66.0 63.0 - 16.0 %   Platelets 248.0 150.0 - 400.0 K/uL   Neutrophils Relative % 59.4 43.0 - 77.0 %   Lymphocytes Relative 32.5 12.0 - 46.0 %   Monocytes Relative 6.2 3.0 - 12.0 %   Eosinophils Relative 1.0 0.0 - 5.0 %   Basophils Relative 0.9 0.0 - 3.0 %   Neutro Abs 2.9 1.4 - 7.7 K/uL   Lymphs Abs 1.6 0.7 - 4.0 K/uL   Monocytes Absolute 0.3 0.1 - 1.0 K/uL   Eosinophils Absolute 0.0 0.0 - 0.7 K/uL   Basophils Absolute 0.0 0.0 - 0.1 K/uL  Vitamin B12     Status: None   Collection Time: 05/09/23   3:50 PM  Result Value Ref Range   Vitamin B-12 405 211 - 911 pg/mL  Folate     Status: None   Collection Time: 05/09/23  3:50 PM  Result Value Ref Range   Folate 14.0 >5.9 ng/mL  Ferritin     Status: None   Collection Time: 05/09/23  3:50 PM  Result Value Ref Range   Ferritin 89.5 10.0 - 291.0 ng/mL  Comp Met (CMET)     Status: Abnormal   Collection Time: 05/09/23  3:50 PM  Result Value Ref Range   Sodium 139 135 - 145 mEq/L   Potassium 4.0 3.5 - 5.1 mEq/L   Chloride 104 96 - 112 mEq/L   CO2 28 19 - 32 mEq/L   Glucose, Bld 97 70 - 99 mg/dL   BUN 11 6 - 23 mg/dL   Creatinine, Ser 1.09 0.40 - 1.20 mg/dL   Total Bilirubin 0.3 0.2 - 1.2 mg/dL   Alkaline  Phosphatase 31 (L) 39 - 117 U/L   AST 19 0 - 37 U/L   ALT 12 0 - 35 U/L   Total Protein 7.6 6.0 - 8.3 g/dL   Albumin 4.7 3.5 - 5.2 g/dL   GFR 147.82 >95.62 mL/min    Comment: Calculated using the CKD-EPI Creatinine Equation (2021)   Calcium 9.6 8.4 - 10.5 mg/dL  Iron, Total/Total Iron Binding Cap     Status: None   Collection Time: 05/09/23  3:50 PM  Result Value Ref Range   Iron 88 40 - 190 mcg/dL   TIBC 130 865 - 784 mcg/dL (calc)   %SAT 32 16 - 45 % (calc)        Garner Nash, MD, MS

## 2023-05-10 ENCOUNTER — Encounter: Payer: Self-pay | Admitting: Family Medicine

## 2023-05-10 ENCOUNTER — Telehealth: Payer: Self-pay

## 2023-05-10 LAB — CBC WITH DIFFERENTIAL/PLATELET
Basophils Absolute: 0 10*3/uL (ref 0.0–0.1)
Basophils Relative: 0.9 % (ref 0.0–3.0)
Eosinophils Absolute: 0 10*3/uL (ref 0.0–0.7)
Eosinophils Relative: 1 % (ref 0.0–5.0)
HCT: 38.2 % (ref 36.0–46.0)
Hemoglobin: 12.5 g/dL (ref 12.0–15.0)
Lymphocytes Relative: 32.5 % (ref 12.0–46.0)
Lymphs Abs: 1.6 10*3/uL (ref 0.7–4.0)
MCHC: 32.7 g/dL (ref 30.0–36.0)
MCV: 89.6 fL (ref 78.0–100.0)
Monocytes Absolute: 0.3 10*3/uL (ref 0.1–1.0)
Monocytes Relative: 6.2 % (ref 3.0–12.0)
Neutro Abs: 2.9 10*3/uL (ref 1.4–7.7)
Neutrophils Relative %: 59.4 % (ref 43.0–77.0)
Platelets: 248 10*3/uL (ref 150.0–400.0)
RBC: 4.26 Mil/uL (ref 3.87–5.11)
RDW: 13.1 % (ref 11.5–15.5)
WBC: 4.9 10*3/uL (ref 4.0–10.5)

## 2023-05-10 LAB — VITAMIN B12: Vitamin B-12: 405 pg/mL (ref 211–911)

## 2023-05-10 LAB — COMPREHENSIVE METABOLIC PANEL
ALT: 12 U/L (ref 0–35)
AST: 19 U/L (ref 0–37)
Albumin: 4.7 g/dL (ref 3.5–5.2)
Alkaline Phosphatase: 31 U/L — ABNORMAL LOW (ref 39–117)
BUN: 11 mg/dL (ref 6–23)
CO2: 28 meq/L (ref 19–32)
Calcium: 9.6 mg/dL (ref 8.4–10.5)
Chloride: 104 meq/L (ref 96–112)
Creatinine, Ser: 0.72 mg/dL (ref 0.40–1.20)
GFR: 114.78 mL/min (ref >60.00)
Glucose, Bld: 97 mg/dL (ref 70–99)
Potassium: 4 meq/L (ref 3.5–5.1)
Sodium: 139 meq/L (ref 135–145)
Total Bilirubin: 0.3 mg/dL (ref 0.2–1.2)
Total Protein: 7.6 g/dL (ref 6.0–8.3)

## 2023-05-10 LAB — FOLATE: Folate: 14 ng/mL (ref >5.9)

## 2023-05-10 LAB — IRON, TOTAL/TOTAL IRON BINDING CAP
%SAT: 32 % (ref 16–45)
Iron: 88 ug/dL (ref 40–190)
TIBC: 276 ug/dL (ref 250–450)

## 2023-05-10 LAB — FERRITIN: Ferritin: 89.5 ng/mL (ref 10.0–291.0)

## 2023-05-10 MED ORDER — MCT OIL PO OIL: 15.0000 mL | TOPICAL_OIL | Freq: Every day | ORAL | 11 refills | Status: DC

## 2023-05-10 MED ORDER — KATE FARMS STANDARD 1.0 PO LIQD: ORAL | 11 refills | Status: AC

## 2023-05-10 NOTE — Telephone Encounter (Signed)
Please advise, see below.

## 2023-05-10 NOTE — Telephone Encounter (Signed)
Submitted form on [email or web address] website per Dr. Janee Morn. Waiting for response from [facility].

## 2023-05-10 NOTE — Telephone Encounter (Signed)
Copied from CRM [id number]. Topic: General - Other >> May 10, 2023  3:14 PM Fredrich Romans wrote: Reason for CRM: [facility] received an order on behalf of patient for formula,however they do not carry the formula that she is needing.As well as they can't accept patient insurance.  Contact #[phone]

## 2023-05-16 ENCOUNTER — Telehealth: Payer: Self-pay

## 2023-05-16 NOTE — Telephone Encounter (Signed)
Received fax from [facility] [place] stating they are unable to provide services in a timely manner and are recommending that the patient be seen by a physician in their local area and would need an alternative plan of care. Is there another office we can refer the patient to for GI?

## 2023-05-28 ENCOUNTER — Encounter: Payer: Self-pay | Admitting: Dietician

## 2023-05-28 ENCOUNTER — Encounter: Payer: 59 | Attending: Family Medicine | Admitting: Dietician

## 2023-05-28 VITALS — Wt 202.4 lb

## 2023-05-28 DIAGNOSIS — R7303 Prediabetes: Secondary | ICD-10-CM | POA: Insufficient documentation

## 2023-05-28 NOTE — Progress Notes (Signed)
 Medical Nutrition Therapy  Appointment Start time:  [phone]  Appointment End time:  [phone]  Primary concerns today: unintentional weight loss, GI issues, limited food intake   Referral diagnosis: prediabetes Preferred learning style: no preference indicated Learning readiness: ready   NUTRITION ASSESSMENT   Anthropometrics   Wt 05/28/23: 202.4 lb Wt 05/02/23: 203 lb  Clinical Medical Hx: POTS, prediabetes, allergies, anemia, anxiety, asthma, depression, mast cell activation syndrome, Hypermobile Ehlers-Danlos syndrome, migraines Medications: reviewed Labs: 02/19/23: A1c 5.8%, vitamin D 29.8. 12/27/22: hemoglobin 11.9.  Notable Signs/Symptoms: abdominal pain, constipation, slow motility,  Food Allergies: mango  Lifestyle & Dietary Hx  Pt is present today alone.   Pt reports she kept track on intake and symptoms for about 2 weeks, and brought the journal with her today. Pt states she noticed symptoms were worse in the evening, so she swapped her evening meal to morning and now has just been having no dinner or some soft cheese. Intake has been soup in morning and latte with 2% milk, then at work a pudding, jello, and [employer] shake, and then cheese in the evening and may snack on candy between. Drinks include diet soda, gatorade, liquid IV, 1-2 [employer] daily.   Pt reports she weighs herself consistently at home and noticed weight loss stopped and has been maintaining, but feels that body composition is continuing to change.   Pt reports new foods include different soups such as a broccoli soup with some cheese in it. Pt states 2 hours after she could not stand up straight from abdominal pain but did not have to leave work. Pt reports she tried adding avocado to her tofu chocolate mousse and it caused GI pain.   Pt reports she continues to have to do 6-7 doses of miralax in 1 day, 1 time per week in order to have a bowel movement.   Previous History 05/02/23:   Pt states she has been  experiencing a number of GI and health issues since 2022. Pt reports she was diagnosed with POTS, mast cell activation syndrome, and chronic idiopathic constipation in 2022, when she began to notice her GI symptoms including early satiety, abdominal pain, and nausea. Pt states at her highest weight she was around 310 lb and dropped to 240 lb by June 2023. At this point she noticed her symptoms greatly improved, and was able to gain about 10-20 lb back. Pt reports in September 2024, her symptoms became progressively worse (upper GI pain, occasionally pain in other areas, nausea, pain after eating), and she reports she was unable to eat anything solid. Pt states she has been working with GI, including a upper and lower GI series, and gastric emptying. Pt reports testing negative for celiac disease, crohn's disease, SIBO, and ulcerative colitis. Along with these GI concerns pt experiences chronic constipation and reports taking 2-4 servings miralax on-and-off for the last 8 months. Pt reports there have been times where she did not have a bowel movement for 1 month. Pt reports she may have a bowel movement 1x/wk, usually type 1-2, with occasional type 7.   Pt states since September 2024 she has mostly been eating soft/liquid foods including beverages, pudding, homemade silken tofu mousse, Molli Posey nutrition supplement, ice cream, soft cheese, [employer]era tomato or potato soup, and broths (see tolerated foods list below). Pt reports she has also been able to eat some mashed potatoes if they are soft/stewy.   Pt states she has been avoiding all grains. Pt reports she had  a ritz cracker and went into anaphylaxis. Pt states she dealt with anaphylaxis once a month for a while due to Mast Cell Activation Syndrome, and carries and epi-pen.   Pt reports likely due to low intake of nutrients she has also been experiencing some fatigue and hair loss.   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 Pt reports an active job averaging 8000-10000 steps daily with lifting animals. Pt states for stress relief she enjoys her hobbies of crocheting, walking, reading, and playing video games with her partner.   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 Pt states she has no concerns with eating disorder history at this point.   Tolerated Foods/Beverages Grains/starches: soft mashed potatoes, potato leek soup Proteins: silken tofu, soft cheese, milk Fruits:  Vegetables: tomato soup, broccoli soup Dairy: 2% milk, soft cheese, ice cream Other: cocoa powder, simple candy (jolly ranchers), jello pudding (SF and regular), broth, broccoli soup Beverages: liquid IV, Gatorade zero/regular, diet/regular soda, Molli Posey  Estimated daily fluid intake: 2-3L water Supplements: CO-Q10, miralax, MVI, omega 3, vitamin C, riboflavin, calcium-vitamin D, quercetin,  Sleep: 7-10 hours Stress / self-care: high stress related to medical conditions and work.  Current average weekly physical activity: walking outside twice a week, active job.   24-Hr Dietary Recall First Meal: 7am: homemade latte with 2% milk and light sweetener and broccoli soup Snack: 11am: pudding pack and coke zero Second Meal: 12pm: gatorade/[employer] Snack: candy Third Meal: soft cheese Snack: none Beverages: 2-3 L water, gatorade zero, diet soda, 1-2 [employer], liquid IV   NUTRITION DIAGNOSIS  [place]-1.4 Altered GI function As related to changes in GI tract motility.  As evidenced by pt report of abdominal pain, nausea, early satiety, constipation, etc .   NUTRITION INTERVENTION  Nutrition education (E-1) on the following topics:   Understanding Altered GI Function and Constipation  Altered GI Function: This can refer to a variety of conditions affecting the digestive system, such as slow gastric emptying,  malabsorption, motility issues, or constipation. Constipation, specifically, refers to infrequent, difficult, or painful bowel movements and can be caused by a range of factors, including poor diet, dehydration, lack of physical activity, or certain medications.  Constipation Symptoms:  Common symptoms include: - Infrequent bowel movements (less than 3 times per week) - Straining during bowel movements - Hard or lumpy stools - Feeling of incomplete evacuation - Abdominal discomfort or bloating  Improving Constipation Symptoms  - Increase Dietary Fiber  Fiber helps form soft, bulky stools. There are two main types of fiber:   - Soluble Fiber: Found in oats, beans, apples, and citrus fruits. It absorbs water and helps form stool.   - Insoluble Fiber: Found in whole grains, vegetables, and the skin of fruits. It helps move stool through the digestive tract more quickly.  Gradual Increase in Fiber:   Increase fiber intake slowly to prevent bloating and gas. Aim for a gradual increase, as sudden spikes can worsen symptoms in sensitive individuals.  - Hydration:   Fiber requires water to function properly. Encourage individuals to drink plenty of fluids (water, herbal teas, etc.) to keep stools soft.  - Healthy Fats:   Incorporating healthy fats like avocado, olive oil, and nuts can lubricate the intestines and make it easier to pass stool.  Adding One Food at a Time  When helping someone  improve their digestion and relieve constipation, introducing one food at a time is key to tracking its effects and avoiding overwhelming the digestive system. Here's a step-by-step approach:  - Start with Soluble Fiber:   Introduce foods rich in soluble fiber (e.g., oats, apples) first, as they tend to be gentler on the gut. These foods absorb water, which can soften stool without causing excessive gas.  - Track Responses:   After adding each food, track how the individual's symptoms respond. Some may  feel relief after introducing high-fiber fruits, while others may not experience immediate changes.  - Increase Slowly:   Once a food is tolerated, try to increase the portion size gradually. For example, start with a small portion of oats, then gradually increase the serving size over several days or weeks.  Tracking Symptoms  Keeping a food and symptom diary can be incredibly helpful in identifying which foods improve or worsen symptoms. Here's how to guide tracking:  - Daily Entries:     - What foods did they eat? (and how much)   - How much water did they drink?   - Did they experience constipation, bloating, or discomfort?   - What was the frequency and ease of bowel movements?  - Note Any Changes:   Any noticeable change in symptoms (positive or negative) should be recorded. For example, if they added more fiber and noticed easier bowel movements, that's valuable information. Alternatively, if adding a particular food caused bloating or gas, that's important to note.  - Assess Over Time:   Tracking symptoms over a period of weeks can help identify patterns and give insight into what foods and habits contribute to improved GI function or worsening symptoms. This can also provide a clearer picture of how fiber intake impacts bowel movements.  Behavioral Considerations  - Regular Physical Activity:   Exercise, even a daily walk, can stimulate bowel movements and improve overall digestion.    - Stress Management:   Stress can affect digestion, so it's important to focus on relaxation techniques like yoga, meditation, or deep breathing exercises.  - Consistency:   Making these changes consistently is key to improvement.   Handouts Provided Include  No handouts provided on this follow up  Samples Provided: Molli Posey peptide 1.0 and 1.5.  Learning Style & Readiness for Change Teaching method utilized: Visual & Auditory  Demonstrated degree of understanding via: Teach Back   Barriers to learning/adherence to lifestyle change: none  Assessment/Continuation of Goals Established by Pt on Previous Visit:  Add 1 T peanut butter to your pudding. - bought but has not tried.   Have 1 c milk while at work. - tried, successful. Continue.   Try [employer] milk. - did not try.  Bring some of your tofu dish to work. - tried, successful. Continue.   Try blending silken tofu into your tomato soup. - did not try.  Try adding collagen to your soup. - did not try due to cost.   Try blending a banana with a cup of milk. - did not try.   Try adding avocado to your mousse. - tried, unsuccessful- caused GI pain.  Keep track of any symptoms after new foods. - successful  New Goals  Aim to follow scheduled meal and snack times eating every 2-3 hours, even if just a few bites.  -Breakfast -Snack (spaced 2 hours between) -Lunch -Snack (spaced 2 hours between) -Dinner  [employer] discussed based on foods you tolerate:  -Breakfast: latte w/  fairlife milk and broccoli soup -Snack: 1/2 [employer] shake -Lunch: pudding + 1/2 [employer] shake -Snack: silken tofu mousse -Dinner: tomato soup + cheese -Snack: pudding/[employer]/cup of fairlife milk/etc.    MONITORING & EVALUATION Dietary intake, weekly physical activity, and follow up in 8 weeks.  Next Steps  Patient is to call for questions.

## 2023-06-13 ENCOUNTER — Telehealth: Payer: Self-pay

## 2023-06-13 ENCOUNTER — Other Ambulatory Visit (HOSPITAL_COMMUNITY): Payer: Self-pay

## 2023-06-13 NOTE — Telephone Encounter (Signed)
 Found this in care everywhere:

## 2023-06-13 NOTE — Telephone Encounter (Signed)
 Thanks I will go ahead and submit with that note as well and see what we get.  Pharmacy Patient Advocate Encounter   Received notification from Fax that prior authorization for Ubrelvy 100MG  tablets is required/requested.   Insurance verification completed.   The patient is insured through [facility] .   Per test claim: PA required; PA submitted to above mentioned insurance via CoverMyMeds Key/confirmation #/EOC [id number] Status is pending

## 2023-06-13 NOTE — Telephone Encounter (Signed)
 Received a request for PA for Ubrelvy-I could not find documentation of pT having tried and failed "TWO" triptans in the chart-      If PT has tried two triptans insurance is asking for the exact names and for how long they were trialed-could not find any listed in med list in EPIC and per [facility] fill list PT only filled Ubrelvy once and that was in Nov 2024. Please advise

## 2023-06-14 ENCOUNTER — Other Ambulatory Visit (HOSPITAL_COMMUNITY): Payer: Self-pay

## 2023-06-14 NOTE — Telephone Encounter (Signed)
 Pharmacy Patient Advocate Encounter  Received notification from [facility] that Prior Authorization for Ubrelvy 100MG  tablets has been APPROVED from 06/13/2023 to 06/12/2024. Ran test claim, Copay is $0. This test claim was processed through [facility]- copay amounts may vary at other pharmacies due to pharmacy/plan contracts, or as the patient moves through the different stages of their insurance plan.   PA #/Case ID/Reference #: PA Case ID #: [id number]

## 2023-06-24 ENCOUNTER — Telehealth: Payer: Self-pay

## 2023-06-24 NOTE — Telephone Encounter (Signed)
 Fml[employer] forms received Will discuss at 06/27/23 visit with PCP

## 2023-06-27 ENCOUNTER — Encounter: Payer: Self-pay | Admitting: Family Medicine

## 2023-06-27 ENCOUNTER — Ambulatory Visit: Admitting: Family Medicine

## 2023-06-27 VITALS — BP 125/76 | HR 86 | Temp 97.4°F | Resp 18 | Wt 203.8 lb

## 2023-06-27 DIAGNOSIS — M2550 Pain in unspecified joint: Secondary | ICD-10-CM

## 2023-06-27 DIAGNOSIS — Z23 Encounter for immunization: Secondary | ICD-10-CM

## 2023-06-27 DIAGNOSIS — K5904 Chronic idiopathic constipation: Secondary | ICD-10-CM

## 2023-06-27 DIAGNOSIS — I951 Orthostatic hypotension: Secondary | ICD-10-CM | POA: Diagnosis not present

## 2023-06-27 DIAGNOSIS — K582 Mixed irritable bowel syndrome: Secondary | ICD-10-CM

## 2023-06-27 DIAGNOSIS — Z008 Encounter for other general examination: Secondary | ICD-10-CM

## 2023-06-27 DIAGNOSIS — G8929 Other chronic pain: Secondary | ICD-10-CM

## 2023-06-27 NOTE — Progress Notes (Signed)
 Assessment/Plan:    Assessment & Plan Dysautonomic Postural Orthostatic Tachycardia Syndrome (POTS) She experiences autonomic symptoms consistent with POTS, including episodes of tachycardia and syncope. Atenolol has been beneficial, but recent episodes included heart rate reaching 170 bpm and syncope. Consideration of electrophysiology evaluation for potential SVT or aberrant pathways was discussed. Fludrocortisone was considered for orthostatic symptoms, balancing blood pressure control and venous constriction. The risks of head trauma from syncope were noted, emphasizing the need to balance treatment for blood pressure and venous constriction. - Consider referral to electrophysiology for further evaluation - Discuss potential use of fludrocortisone with cardiology  FMLA Recertification Recertification of FMLA for another year was discussed due to ongoing medical issues affecting work performance. Symptoms impairing job functions include dizziness, lethargy, and the need for frequent breaks after standing or walking for more than five minutes. Episodes occur up to four times a week, requiring a day of recovery. She has ADA accommodations at work for taking extra breaks. Flexibility in recovery time due to the intermittent nature of symptoms was emphasized. - Recertify FMLA for another year - Document job function impairments and symptom impact on work performance  Chronic Pain Chronic pain is reported in the neck, upper back, knees, and GI tract. Pain is a daily occurrence but has become background noise.  Nutritional Management She has been stabilizing weight through dietary modifications, including collagen powder and honey in tea. Weight is stable around 200 lbs. Issues with obtaining [employer] nutritional supplements due to DME company delays. A letter of medical necessity was drafted to assist with insurance coverage. The importance of continued dietary efforts and the potential impact  of DME delays on nutritional status were discussed. - Follow up on [employer] nutritional supplement acquisition - Encourage continued dietary efforts to maintain weight  General Health Maintenance Allergies are well-controlled with cromolyn and other medications. Vitamin and nutrient levels are stable, with previous labs showing a slightly low Vitamin D in December. Pneumonia vaccination was discussed and agreed upon. - Administer pneumonia vaccination - Plan for follow-up labs in four months to check weight and basic labs  Follow-up Follow-up plans for various medical issues were discussed, including cardiology and GI referrals. She is awaiting a GI appointment in June and considering a referral to [facility] for more comprehensive care. The potential need for alternative referrals if [facility] is not accessible was discussed. - Refer to [facility] for GI evaluation - Schedule follow-up in four months for weight check and labs - Follow up on [employer] supplement process - Ensure cardiology follow-up is maintained      There are no discontinued medications.  Return in about 4 months (around 10/27/2023) for weight check , fasting labs.    Subjective:   Encounter date: 06/27/2023  Shelby Dyer is a 28 y.o. female who has Hypermobility syndrome; Dysautonomia orthostatic hypotension syndrome; Mast cell activation (HCC); Chronic migraine without aura, with intractable migraine, so stated, with status migrainosus; Inappropriate sinus tachycardia (HCC); Median nerve neuritis, right; Idiopathic hypersomnolence; IBS (irritable bowel syndrome); Hip pain; Chronic idiopathic constipation; Ehlers-Danlos syndrome; PTSD (post-traumatic stress disorder); Patellofemoral pain syndrome of both knees; Myofascial pain syndrome, cervical; Needle stick, hypodermic, accidental, initial encounter; Foreign body of left index finger with infection; Chronic pain of multiple joints; Class 2 obesity in adult; Migraine with aura  and with status migrainosus, not intractable; Unintended weight loss; Generalized intestinal dysmotility; and Prediabetes on their problem list..   She  has a past medical history of Allergy (2000), Anemia (  2020), Anxiety, Asthma (Early 2021), Chronic idiopathic constipation, Chronic migraine with aura, Depression (2012?), Hypermobile Ehlers-Danlos syndrome, Idiopathic hypersomnolence, Mast cell activation syndrome (HCC), Myofascial pain syndrome, cervical (11/14/2021), Peripheral neuropathy, POTS (postural orthostatic tachycardia syndrome), and Ulcer (2018)..   She presents with chief complaint of FMLA (Pt is here for FMLA renewal intermittent /Prevnar 20 vaccine is due /Cervical screening due - [employer] ) .   Discussed the use of AI scribe software for clinical note transcription with the patient, who gave verbal consent to proceed.  History of Present Illness Shelby Dyer is a 28 year old female with autonomic dysfunction who presents for FMLA follow-up.  She is experiencing symptoms of dizziness, lethargy, and episodes of syncope, which impair her ability to stand or walk for extended periods and handle animals safely at work. She requires accommodations for intermittent rest periods and recovery time after episodes.  Her autonomic symptoms are consistent with postural orthostatic tachycardia syndrome (POTS), including syncope upon standing. She is currently on atenolol, which has been helpful, but she experienced a severe episode a couple of weekends ago with a heart rate of 170 bpm, leading to syncope four to five times. This episode resolved with an extra dose of medication. Multiple echocardiograms and Holter monitors have not shown any structural abnormalities. She has not tried fludrocortisone for these symptoms and wears 20-30 mmHg compression stockings to help manage orthostatic symptoms.  Her weight has stabilized since her last visit, which she attributes to dietary  adjustments such as adding collagen powder to her tea. She is actively trying to increase her caloric intake and is working with a Data processing manager. She uses [employer] nutritional supplements, one to two per day, to help maintain her weight. She has not been contacted by the DME company regarding these supplements.  She experiences daily pain, including gastrointestinal pain, knee pain, and neck and upper back pain. These symptoms are persistent but have become background issues for her.  Her work is impacted by her symptoms, particularly dizziness and lethargy, which affect her ability to stand or walk for more than five minutes without needing a ten-minute break. She experiences episodes of symptoms up to four times a week, requiring a day of recovery. She attends two to three medical appointments per month.       Past Surgical History:  Procedure Laterality Date   adenoids removed     double scope      Tub place in both ear  Bilateral    When she was 28 years of age   WISDOM TOOTH EXTRACTION  2006   All 4    Outpatient Medications Prior to Visit  Medication Sig Dispense Refill   acetaminophen (TYLENOL) 500 MG tablet Take 1,000 mg by mouth as needed.     Ascorbic Acid (VITAMIN C PO) Take 500-2,000 mg by mouth daily in the afternoon.     atenolol (TENORMIN) 25 MG tablet Take by mouth daily. 25 mg in the morning and 12.5 mg mid day.     cetirizine (ZYRTEC) 10 MG tablet Take 20 mg by mouth in the morning and at bedtime.     Coenzyme Q10 (COQ-10 PO) Take 400 mg by mouth daily at 6 (six) AM.     cromolyn (GASTROCROM) 100 MG/5ML solution Take 200-800 mg by mouth 4 (four) times daily.     cromolyn (NASALCROM) 5.2 MG/ACT nasal spray 1-2 sprays as needed.     EPINEPHrine (PRIMATENE MIST) 0.125 MG/ACT AERO 1-2 puffs as needed.  EPINEPHrine 0.3 mg/0.3 mL IJ SOAJ injection Inject 0.3 mg into the muscle as needed for anaphylaxis.     famotidine (PEPCID) 20 MG tablet Take 20 mg by mouth 2 (two) times  daily.     gabapentin (NEURONTIN) 100 MG capsule Take 100 mg by mouth 3 (three) times daily.     Galcanezumab-gnlm (EMGALITY) 120 MG/ML SOAJ Inject 120 mg into the skin every 30 (thirty) days. PLEASE USE COPAY CARD BIN: [id number] PCN [id number] GRP [id number] ID [id number] Expires 03/26/2023 3 mL 11   hydroxychloroquine (PLAQUENIL) 200 MG tablet Take 200 mg by mouth 2 (two) times daily.     hydrOXYzine (ATARAX) 25 MG tablet Take 25-50 mg by mouth every 8 (eight) hours as needed (allergies).     hyoscyamine (LEVSIN) 0.125 MG tablet Take 1 tablet (0.125 mg total) by mouth every 4 (four) hours as needed for cramping (abdominal discomfort). 30 tablet 0   levonorgestrel (MIRENA) 20 MCG/24HR IUD 1 each by Intrauterine route once.     lubiprostone (AMITIZA) 8 MCG capsule Take 8 mcg by mouth 2 (two) times daily with a meal.     medium chain triglycerides (MCT OIL) oil Take 15 mLs by mouth daily. 473 mL 11   Multiple Vitamin (MULTIVITAMIN) tablet Take 1 tablet by mouth daily.     Multiple Vitamins-Minerals (ZINC PO) Take by mouth.     Naltrexone HCl POWD 1 mg by mouth daily for 30 days     Naltrexone HCl, Pain, 1.5 MG CAPS Take 2 mg by mouth daily.     nortriptyline (PAMELOR) 25 MG capsule Take 75 mg by mouth daily after supper.     Nutritional Supplements (KATE FARMS STANDARD 1.0) LIQD 1 Bottle TID. 82956 mL 11   Omega-3 Fatty Acids (FISH OIL PO) Take by mouth daily at 6 (six) AM.     ondansetron (ZOFRAN) 4 MG tablet Take 1 tablet (4 mg total) by mouth every 8 (eight) hours as needed for nausea or vomiting. 20 tablet 0   Polyethylene Glycol 3350 (MIRALAX PO) Take by mouth.     pyridostigmine (MESTINON) 60 MG tablet Take 60 mg by mouth in the morning and at bedtime.     QUERCETIN PO Take 3,000 mg by mouth daily in the afternoon.     Riboflavin 100 MG TABS Take 200 mg by mouth 2 (two) times daily.     Rimegepant Sulfate (NURTEC) 75 MG TBDP Take 1 tablet (75 mg total) by mouth daily as needed. For migraines. Take as  close to onset of migraine as possible. One daily maximum. 6 tablet 0   Ubrogepant (UBRELVY) 100 MG TABS Take 1 tablet (100 mg total) by mouth every 2 (two) hours as needed. Maximum 200mg  a day. Please use copay card: BIN [id number] PCN [id number] GRP [id number] ID [id number] 16 tablet 11   UNABLE TO FIND Take by mouth 2 (two) times daily. Med Name: MAGNESIUM COMPLEX  200 mg AM and 400 mg PM     UNABLE TO FIND Take 1,000 mg by mouth daily. Med Name: VITASSIUM brand salt capsules     XOLAIR 150 MG/ML prefilled syringe Inject 300 mg into the skin every 14 (fourteen) days.     No facility-administered medications prior to visit.    Family History  Problem Relation Age of Onset   Migraines Mother    Anxiety disorder Mother    Depression Mother    Ehlers-Danlos syndrome Mother    Atrial fibrillation Father    Other Sister  Anxiety disorder Sister    Depression Sister    Learning disabilities Sister    Other Sister        POTS   Hypertension Brother    Asthma Brother    Learning disabilities Brother    Depression Brother    Hypertension Brother    Learning disabilities Brother    Obesity Brother    Migraines Maternal Grandmother    Kidney disease Maternal Grandfather    Stroke Maternal Grandfather    Breast cancer Paternal Grandmother    Cancer Paternal Grandmother    Varicose Veins Paternal Grandmother    Cancer Paternal Grandfather    Early death Maternal Aunt    Colon cancer Neg Hx    Stomach cancer Neg Hx    Liver cancer Neg Hx    Pancreatic cancer Neg Hx     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  Tobacco Use   Smoking status: Former    Current packs/day: 0.00    Average packs/day: 0.5 packs/day for 2.0  years (1.0 ttl pk-yrs)    Types: Cigarettes    Start date: 03/26/2014    Quit date: 03/26/2016    Years since quitting: 7.2   Smokeless tobacco: Never  Vaping Use   Vaping status: Never Used  Substance and Sexual Activity   Alcohol use: Yes    Alcohol/week: 1.0 - 2.0 standard drink of alcohol    Types: 1 - 2 Standard drinks or equivalent per week    Comment: Very rarely drink cocktail   Drug use: Not Currently    Types: Marijuana    Comment: Used to smoke in college   Sexual activity: Yes    Birth control/protection: I.U.D.    Comment: 1 partner - my husband  Other Topics Concern   Not on file  Social History Narrative   Caffeine: none   Social Drivers of Corporate investment banker Strain: Low Risk  (05/09/2023)   Overall Financial Resource Strain (CARDIA)    Difficulty of Paying Living Expenses: Not very hard  Food Insecurity: No Food Insecurity (05/09/2023)   Hunger Vital Sign    Worried About Running Out of Food in the Last Year: Never true    Ran Out of Food in the Last Year: Never true  Transportation Needs: No Transportation Needs (05/09/2023)   PRAPARE - Administrator, Civil Service (Medical): No    Lack of Transportation (Non-Medical): No  Physical Activity: Sufficiently Active (05/09/2023)   Exercise Vital Sign    Days of Exercise per Week: 5 days    Minutes of Exercise per Session: 30 min  Recent Concern: Physical Activity - Insufficiently Active (02/25/2023)   Exercise Vital Sign    Days of Exercise per Week: 3 days    Minutes of Exercise per Session: 30 min  Stress: No Stress Concern Present (05/09/2023)   [employer] of Occupational Health - Occupational Stress Questionnaire    Feeling of Stress : Only a little  Social Connections: Socially Integrated (05/09/2023)   Social Connection and Isolation Panel [NHANES]    Frequency of Communication with Friends and Family: More than three times a week    Frequency  of Social Gatherings with Friends and  Family: More than three times a week    Attends Religious Services: More than 4 times per year    Active Member of [employer] or Organizations: Yes    Attends Banker Meetings: 1 to 4 times per year    Marital Status: Married  Catering manager Violence: Not At Risk (02/14/2022)   Received from [employer], [facility]   HITS    Over the last 12 months how often did your partner physically hurt you?: Never    Over the last 12 months how often did your partner insult you or talk down to you?: Never    Over the last 12 months how often did your partner threaten you with physical harm?: Never    Over the last 12 months how often did your partner scream or curse at you?: Never                                                                                                  Objective:  Physical Exam: BP 125/76 (BP Location: Left Arm, Patient Position: Sitting, Cuff Size: Large)   Pulse 86   Temp (!) 97.4 F (36.3 C) (Temporal)   Resp 18   Wt 203 lb 12.8 oz (92.4 kg)   LMP  (Exact Date)   SpO2 100%   BMI 30.10 kg/m   Wt Readings from Last 3 Encounters:  06/27/23 203 lb 12.8 oz (92.4 kg)  05/28/23 202 lb 6.4 oz (91.8 kg)  05/09/23 200 lb 12.8 oz (91.1 kg)    Physical Exam MEASUREMENTS: Weight- 200.   Physical Exam Constitutional:      General: She is not in acute distress.    Appearance: Normal appearance. She is not ill-appearing or toxic-appearing.  HENT:     Head: Normocephalic and atraumatic.     Nose: Nose normal. No congestion.  Eyes:     General: No scleral icterus.    Extraocular Movements: Extraocular movements intact.  Cardiovascular:     Rate and Rhythm: Normal rate and regular rhythm.     Pulses: Normal pulses.     Heart sounds: Normal heart sounds.  Pulmonary:     Effort: Pulmonary effort is normal. No respiratory distress.     Breath sounds: Normal breath sounds.  Abdominal:     General: Abdomen is flat. Bowel sounds are normal.      Palpations: Abdomen is soft.  Musculoskeletal:        General: Normal range of motion.  Lymphadenopathy:     Cervical: No cervical adenopathy.  Skin:    General: Skin is warm and dry.     Findings: No rash.  Neurological:     General: No focal deficit present.     Mental Status: She is alert and oriented to person, place, and time. Mental status is at baseline.  Psychiatric:        Mood and Affect: Mood normal.        Behavior: Behavior normal.        Thought Content: Thought content normal.  Judgment: Judgment normal.     No results found.  Recent Results (from the past 2160 hours)  CBC w/Diff     Status: None   Collection Time: 05/09/23  3:50 PM  Result Value Ref Range   WBC 4.9 4.0 - 10.5 K/uL   RBC 4.26 3.87 - 5.11 Mil/uL   Hemoglobin 12.5 12.0 - 15.0 g/dL   HCT 40.9 81.1 - 91.4 %   MCV 89.6 78.0 - 100.0 fl   MCHC 32.7 30.0 - 36.0 g/dL   RDW 78.2 95.6 - 21.3 %   Platelets 248.0 150.0 - 400.0 K/uL   Neutrophils Relative % 59.4 43.0 - 77.0 %   Lymphocytes Relative 32.5 12.0 - 46.0 %   Monocytes Relative 6.2 3.0 - 12.0 %   Eosinophils Relative 1.0 0.0 - 5.0 %   Basophils Relative 0.9 0.0 - 3.0 %   Neutro Abs 2.9 1.4 - 7.7 K/uL   Lymphs Abs 1.6 0.7 - 4.0 K/uL   Monocytes Absolute 0.3 0.1 - 1.0 K/uL   Eosinophils Absolute 0.0 0.0 - 0.7 K/uL   Basophils Absolute 0.0 0.0 - 0.1 K/uL  Vitamin B12     Status: None   Collection Time: 05/09/23  3:50 PM  Result Value Ref Range   Vitamin B-12 405 211 - 911 pg/mL  Folate     Status: None   Collection Time: 05/09/23  3:50 PM  Result Value Ref Range   Folate 14.0 >5.9 ng/mL  Ferritin     Status: None   Collection Time: 05/09/23  3:50 PM  Result Value Ref Range   Ferritin 89.5 10.0 - 291.0 ng/mL  Comp Met (CMET)     Status: Abnormal   Collection Time: 05/09/23  3:50 PM  Result Value Ref Range   Sodium 139 135 - 145 mEq/L   Potassium 4.0 3.5 - 5.1 mEq/L   Chloride 104 96 - 112 mEq/L   CO2 28 19 - 32 mEq/L    Glucose, Bld 97 70 - 99 mg/dL   BUN 11 6 - 23 mg/dL   Creatinine, Ser 0.86 0.40 - 1.20 mg/dL   Total Bilirubin 0.3 0.2 - 1.2 mg/dL   Alkaline Phosphatase 31 (L) 39 - 117 U/L   AST 19 0 - 37 U/L   ALT 12 0 - 35 U/L   Total Protein 7.6 6.0 - 8.3 g/dL   Albumin 4.7 3.5 - 5.2 g/dL   GFR 578.46 >96.29 mL/min    Comment: Calculated using the CKD-EPI Creatinine Equation (2021)   Calcium 9.6 8.4 - 10.5 mg/dL  Iron, Total/Total Iron Binding Cap     Status: None   Collection Time: 05/09/23  3:50 PM  Result Value Ref Range   Iron 88 40 - 190 mcg/dL   TIBC 528 413 - 244 mcg/dL (calc)   %SAT 32 16 - 45 % (calc)        Garner Nash, MD, MS

## 2023-07-25 ENCOUNTER — Ambulatory Visit: Admitting: Dietician

## 2023-08-08 ENCOUNTER — Telehealth: Payer: Self-pay | Admitting: Neurology

## 2023-08-08 NOTE — Telephone Encounter (Signed)
 LVM and sent MyChart message informing pt of r/s needed for 6/26 appt- NP out.

## 2023-09-19 ENCOUNTER — Ambulatory Visit: Payer: 59 | Admitting: Neurology

## 2023-10-02 HISTORY — PX: ESOPHAGOGASTRODUODENOSCOPY ENDOSCOPY: SHX5814

## 2023-10-11 ENCOUNTER — Encounter: Payer: Self-pay | Admitting: Neurology

## 2023-10-11 ENCOUNTER — Ambulatory Visit: Admitting: Neurology

## 2023-10-11 VITALS — BP 113/67 | HR 78 | Ht 69.0 in | Wt 192.0 lb

## 2023-10-11 DIAGNOSIS — G43711 Chronic migraine without aura, intractable, with status migrainosus: Secondary | ICD-10-CM

## 2023-10-11 DIAGNOSIS — G43101 Migraine with aura, not intractable, with status migrainosus: Secondary | ICD-10-CM

## 2023-10-11 DIAGNOSIS — G43009 Migraine without aura, not intractable, without status migrainosus: Secondary | ICD-10-CM

## 2023-10-11 MED ORDER — UBRELVY 100 MG PO TABS
100.0000 mg | ORAL_TABLET | ORAL | 11 refills | Status: AC | PRN
Start: 2023-10-11 — End: ?

## 2023-10-11 MED ORDER — EMGALITY 120 MG/ML ~~LOC~~ SOAJ: 120.0000 mg | SUBCUTANEOUS | 11 refills | Status: AC

## 2023-10-11 NOTE — Patient Instructions (Signed)
 Great to meet you today! Continue current medications for migraine management Call for worsening headaches Follow up in 1 year Thanks!!

## 2023-10-11 NOTE — Progress Notes (Signed)
 Patient: Shelby Dyer Date of Birth: January 13, 1996  Reason for Visit: Follow up History from: Patient Primary Neurologist: Ines   ASSESSMENT AND PLAN 28 y.o. year old female with migraine with and without aura. Complained of symptoms of paresthesia, crawling from calves down and hands. Labs showed A1C 5.8, Vit D 29.8, MRI brain with and without was normal. Does not desire further work-up for paresthesia. Migraines under great control!  -Continue Emgality  120 mg monthly injection for migraine prevention  -Continue Ubrelvy  100 mg as needed for acute migraine -Advised we recommend being off Emgality  for 6 months prior to pregnancy, she is on Mirena, not planning pregnancy  -Follow up in 1 year or sooner if needed   Meds ordered this encounter  Medications   Galcanezumab -gnlm (EMGALITY ) 120 MG/ML SOAJ    Sig: Inject 120 mg into the skin every 30 (thirty) days. PLEASE USE COPAY CARD BIN: [id number] PCN PDMI GRP [id number] ID [id number] Expires 03/26/2023    Dispense:  3 mL    Refill:  11    Dispence a 3 month supply if insurance will approve otherwise dispense one month.PLEASE USE COPAY CARD BIN: [id number] PCN PDMI GRP [id number] ID [id number] Expires 03/26/2023   Ubrogepant  (UBRELVY ) 100 MG TABS    Sig: Take 1 tablet (100 mg total) by mouth every 2 (two) hours as needed. Maximum 200mg  a day. Please use copay card: BIN [id number] PCN 54 GRP [id number] ID [id number]    Dispense:  16 tablet    Refill:  11    Please use copay card: BIN [id number] PCN 54 GRP [id number] ID [id number]   There is increased risk for stroke in women with migraine with aura and a contraindication for the combined contraceptive pill for use by women who have migraine with aura. The risk for women with migraine without aura is lower. However other risk factors like smoking are far more likely to increase stroke risk than migraine. There is a recommendation for no smoking and for the use of OCPs without estrogen such as progestogen  only pills particularly for women with migraine with aura.SABRA People who have migraine headaches with auras may be 3 times more likely to have a stroke caused by a blood clot, compared to migraine patients who don't see auras. Women who take hormone-replacement therapy may be 30 percent more likely to suffer a clot-based stroke than women not taking medication containing estrogen. Other risk factors like smoking and high blood pressure may be  much more important. And stroke is still a rare complication due to migraine aura and is controversial and lower doses may not cause a risk.  HISTORY OF PRESENT ILLNESS: Today 10/11/23 Extensive lab evaluation in Nov 2024 for neuropathy showed A1C 5.8, Vitamin D  29.8. MRI brain with and without contrast was normal. On Emgality  and Ubrelvy . Works as a Museum/gallery conservator. Is just going to deal with crawling sensation in both hands and calves to feet. Is usually constant, but intensity varies, doesn't prevent her from doing anything, not getting worse. Unclear exactly when started, likely few years ago. Doesn't really want to do anything further for work up. Migraines under good control, 3-4 a month, managed quicjly with Ubrelvy . Insurance covers well. Sometimes aura with migraines, will get static vision, speech can slur or get fast or slow, few times a year. Has lost 140 lbs in the last 2 years with unknown GI etiology, on her 3rd opinion. Stopped nortriptyline due concern contributing to constipation.   HISTORY  Dr.  Ines 02/19/2023: Much better on Emgality  only 4 migraines a month and < 8 total headache days a month. Tried nurtec samples did not help. Tried triptans in the past. Try Ubrelvy . With and without her migraines she can get slurred speech, dizziness, double vision, worse at work. Likely a migraine aura but needs thorough evaluation due to concerning symptoms. Burning, numbness and tingling from the calfs down and in the hands. Feels like a bug crawling up the leg.  Twitching in the lower legs. In all the fingers. Had an abnormal sweat test.   REVIEW OF SYSTEMS: Out of a complete 14 system review of symptoms, the patient complains only of the following symptoms, and all other reviewed systems are negative.  See HPI  ALLERGIES: Allergies  Allergen Reactions   Latex Anaphylaxis and Hives   Nsaids     MCAS reaction- GI pain, distress, brain fog, lethargy   Tilactase Nausea And Vomiting    HOME MEDICATIONS: Outpatient Medications Prior to Visit  Medication Sig Dispense Refill   acetaminophen (TYLENOL) 500 MG tablet Take 1,000 mg by mouth as needed.     Ascorbic Acid (VITAMIN C PO) Take 500-2,000 mg by mouth daily in the afternoon.     atenolol (TENORMIN) 25 MG tablet Take by mouth daily. 25 mg in the morning and 12.5 mg mid day.     cetirizine (ZYRTEC) 10 MG tablet Take 20 mg by mouth in the morning and at bedtime. (Patient taking differently: Take 20 mg by mouth every morning.)     Coenzyme Q10 (COQ-10 PO) Take 400 mg by mouth daily at 6 (six) AM.     EPINEPHrine  (PRIMATENE  MIST) 0.125 MG/ACT AERO 1-2 puffs as needed.     EPINEPHrine  0.3 mg/0.3 mL IJ SOAJ injection Inject 0.3 mg into the muscle as needed for anaphylaxis.     famotidine (PEPCID) 20 MG tablet Take 20 mg by mouth 2 (two) times daily.     gabapentin (NEURONTIN) 100 MG capsule Take 100 mg by mouth 3 (three) times daily.     Galcanezumab -gnlm (EMGALITY ) 120 MG/ML SOAJ Inject 120 mg into the skin every 30 (thirty) days. PLEASE USE COPAY CARD BIN: [id number] PCN PDMI GRP [id number] ID [id number] Expires 03/26/2023 3 mL 11   hydroxychloroquine (PLAQUENIL) 200 MG tablet Take 200 mg by mouth 2 (two) times daily.     hydrOXYzine (ATARAX) 25 MG tablet Take 25-50 mg by mouth every 8 (eight) hours as needed (allergies).     levonorgestrel (MIRENA) 20 MCG/24HR IUD 1 each by Intrauterine route once.     lubiprostone (AMITIZA) 8 MCG capsule Take 8 mcg by mouth 2 (two) times daily with a meal.      Multiple Vitamin (MULTIVITAMIN) tablet Take 1 tablet by mouth daily.     Multiple Vitamins-Minerals (ZINC PO) Take by mouth.     Naltrexone HCl POWD 1 mg by mouth daily for 30 days     Nutritional Supplements (KATE FARMS STANDARD 1.0) LIQD 1 Bottle TID. 29250 mL 11   Omega-3 Fatty Acids (FISH OIL PO) Take by mouth daily at 6 (six) AM.     ondansetron  (ZOFRAN ) 4 MG tablet Take 1 tablet (4 mg total) by mouth every 8 (eight) hours as needed for nausea or vomiting. 20 tablet 0   Polyethylene Glycol 3350 (MIRALAX PO) Take by mouth.     Prucalopride Succinate  (MOTEGRITY ) 2 MG TABS Take 1 tablet by mouth in the morning.     pyridostigmine (MESTINON) 60 MG tablet Take  60 mg by mouth in the morning and at bedtime.     QUERCETIN PO Take 3,000 mg by mouth daily in the afternoon.     Riboflavin 100 MG TABS Take 200 mg by mouth 2 (two) times daily.     Ubrogepant  (UBRELVY ) 100 MG TABS Take 1 tablet (100 mg total) by mouth every 2 (two) hours as needed. Maximum 200mg  a day. Please use copay card: BIN [id number] PCN 54 GRP [id number] ID [id number] 16 tablet 11   UNABLE TO FIND Take by mouth 2 (two) times daily. Med Name: MAGNESIUM COMPLEX  200 mg AM and 400 mg PM     UNABLE TO FIND Take 1,000 mg by mouth daily. Med Name: VITASSIUM brand salt capsules     XOLAIR 150 MG/ML prefilled syringe Inject 300 mg into the skin every 14 (fourteen) days.     cromolyn (GASTROCROM) 100 MG/5ML solution Take 200-800 mg by mouth 4 (four) times daily.     cromolyn (NASALCROM) 5.2 MG/ACT nasal spray 1-2 sprays as needed.     hyoscyamine  (LEVSIN ) 0.125 MG tablet Take 1 tablet (0.125 mg total) by mouth every 4 (four) hours as needed for cramping (abdominal discomfort). 30 tablet 0   medium chain triglycerides (MCT OIL) oil Take 15 mLs by mouth daily. 473 mL 11   Naltrexone HCl, Pain, 1.5 MG CAPS Take 2 mg by mouth daily.     nortriptyline (PAMELOR) 25 MG capsule Take 75 mg by mouth daily after supper.     Rimegepant Sulfate (NURTEC)  75 MG TBDP Take 1 tablet (75 mg total) by mouth daily as needed. For migraines. Take as close to onset of migraine as possible. One daily maximum. 6 tablet 0   No facility-administered medications prior to visit.    PAST MEDICAL HISTORY: Past Medical History:  Diagnosis Date   Allergy 2000   Hay fever, mcas, eczema, etc   Anemia 2020   Off/on low hct and/or low hemoglobin   Anxiety    Asthma Early 2021   Chronic idiopathic constipation    Chronic migraine with aura    Depression 2012?   Hypermobile Ehlers-Danlos syndrome    Idiopathic hypersomnolence    Mast cell activation syndrome (HCC)    Myofascial pain syndrome, cervical 11/14/2021   Peripheral neuropathy    small fiber, based on sweat test   POTS (postural orthostatic tachycardia syndrome)    Ulcer 2018   Stomach ulcer    PAST SURGICAL HISTORY: Past Surgical History:  Procedure Laterality Date   adenoids removed     double scope      ESOPHAGOGASTRODUODENOSCOPY ENDOSCOPY  10/02/2023   Tub place in both ear  Bilateral    When she was 28 years of age   WISDOM TOOTH EXTRACTION  2006   All 4    FAMILY HISTORY: Family History  Problem Relation Age of Onset   Migraines Mother    Anxiety disorder Mother    Depression Mother    Ehlers-Danlos syndrome Mother    Atrial fibrillation Father    Other Sister    Anxiety disorder Sister    Depression Sister    Learning disabilities Sister    Other Sister        POTS   Hypertension Brother    Asthma Brother    Learning disabilities Brother    Depression Brother    Hypertension Brother    Learning disabilities Brother    Obesity Brother    Migraines Maternal Grandmother  Kidney disease Maternal Grandfather    Stroke Maternal Grandfather    Breast cancer Paternal Grandmother    Cancer Paternal Grandmother    Varicose Veins Paternal Grandmother    Cancer Paternal Grandfather    Early death Maternal Aunt    Colon cancer Neg Hx    Stomach cancer Neg Hx    Liver  cancer Neg Hx    Pancreatic cancer Neg Hx     SOCIAL HISTORY: Social History   Socioeconomic History   Marital status: Married    Spouse name: Logan   Number of children: Not on file   Years of education: Not on file   Highest education level: Bachelor's degree (e.g., BA, AB, BS)  Occupational History   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  Tobacco Use   Smoking status: Former    Current packs/day: 0.00    Average packs/day: 0.5 packs/day for 2.0 years (1.0 ttl pk-yrs)    Types: Cigarettes    Start date: 03/26/2014    Quit date: 03/26/2016    Years since quitting: 7.5   Smokeless tobacco: Never  Vaping Use   Vaping status: Never Used  Substance and Sexual Activity   Alcohol use: Yes    Alcohol/week: 1.0 - 2.0 standard drink of alcohol    Types: 1 - 2 Standard drinks or equivalent per week    Comment: Very rarely drink cocktail   Drug use: Not Currently    Types: Marijuana    Comment: Used to smoke in college   Sexual activity: Yes    Birth control/protection: I.U.D.    Comment: 1 partner - my husband  Other Topics Concern   Not on file  Social History Narrative   Caffeine: none   Social Drivers of Corporate investment banker Strain: Low Risk  (05/09/2023)   Overall Financial Resource Strain (CARDIA)    Difficulty of Paying Living Expenses: Not very hard  Food Insecurity: No Food Insecurity (05/09/2023)   Hunger Vital Sign    Worried About Running Out of Food in the Last Year: Never true    Ran Out of Food in the Last Year: Never true  Transportation Needs: No Transportation Needs (05/09/2023)   PRAPARE - Administrator, Civil Service (Medical): No    Lack of Transportation (Non-Medical): No  Physical Activity: Sufficiently Active (05/09/2023)   Exercise Vital Sign    Days of Exercise per Week: 5 days    Minutes of Exercise per Session: 30 min  Recent Concern: Physical Activity - Insufficiently Active (02/25/2023)   Exercise  Vital Sign    Days of Exercise per Week: 3 days    Minutes of Exercise per Session: 30 min  Stress: No Stress Concern Present (05/09/2023)   [employer] of Occupational Health - Occupational Stress Questionnaire    Feeling of Stress : Only a little  Social Connections: Socially Integrated (05/09/2023)   Social Connection and Isolation Panel    Frequency of Communication with Friends and Family: More than three times a week    Frequency of Social Gatherings with Friends and Family: More than three times a week    Attends Religious Services: More than 4 times per year    Active Member of [employer] or Organizations: Yes    Attends Banker Meetings: 1 to 4 times per year    Marital Status: Married  Catering manager Violence: Not At Risk (02/14/2022)   Received from [facility]  HITS    Over the last 12 months how often did your partner physically hurt you?: Never    Over the last 12 months how often did your partner insult you or talk down to you?: Never    Over the last 12 months how often did your partner threaten you with physical harm?: Never    Over the last 12 months how often did your partner scream or curse at you?: Never    PHYSICAL EXAM  Vitals:   10/11/23 0943  BP: 113/67  Pulse: 78  Weight: 192 lb (87.1 kg)  Height: 5' 9 (1.753 m)   Body mass index is 28.35 kg/m.  Generalized: Well developed, in no acute distress  Neurological examination  Mentation: Alert oriented to time, place, history taking. Follows all commands speech and language fluent Cranial nerve II-XII: Pupils were equal round reactive to light. Extraocular movements were full, visual field were full on confrontational test. Facial sensation and strength were normal. Head turning and shoulder shrug  were normal and symmetric. Motor: The motor testing reveals 5 over 5 strength of all 4 extremities. Good symmetric motor tone is noted throughout.  Sensory: Sensory testing is intact to soft  touch on all 4 extremities. No evidence of extinction is noted.  Coordination: Cerebellar testing reveals good finger-nose-finger bilaterally Gait and station: Gait is normal.  Reflexes: Deep tendon reflexes are symmetric and normal bilaterally.   DIAGNOSTIC DATA (LABS, IMAGING, TESTING) - I reviewed patient records, labs, notes, testing and imaging myself where available.  Lab Results  Component Value Date   WBC 4.9 05/09/2023   HGB 12.5 05/09/2023   HCT 38.2 05/09/2023   MCV 89.6 05/09/2023   PLT 248.0 05/09/2023      Component Value Date/Time   NA 139 05/09/2023 1550   K 4.0 05/09/2023 1550   CL 104 05/09/2023 1550   CO2 28 05/09/2023 1550   GLUCOSE 97 05/09/2023 1550   BUN 11 05/09/2023 1550   CREATININE 0.72 05/09/2023 1550   CALCIUM 9.6 05/09/2023 1550   PROT 7.6 05/09/2023 1550   PROT 6.9 02/19/2023 1657   ALBUMIN 4.7 05/09/2023 1550   AST 19 05/09/2023 1550   ALT 12 05/09/2023 1550   ALKPHOS 31 (L) 05/09/2023 1550   BILITOT 0.3 05/09/2023 1550   No results found for: CHOL, HDL, LDLCALC, LDLDIRECT, TRIG, CHOLHDL Lab Results  Component Value Date   HGBA1C 5.8 (H) 02/19/2023   Lab Results  Component Value Date   VITAMINB12 405 05/09/2023   Lab Results  Component Value Date   TSH 1.770 02/19/2023    Lauraine Born, AGNP-C, DNP 10/11/2023, 9:46 AM [facility] [address] [place], [place] [address] [phone]

## 2023-10-25 ENCOUNTER — Telehealth: Payer: Self-pay | Admitting: Pharmacist

## 2023-10-25 NOTE — Telephone Encounter (Signed)
 Pharmacy Patient Advocate Encounter   Received notification from CoverMyMeds that prior authorization for Emgality  120MG /ML auto-injectors (migraine) is required/requested.   Insurance verification completed.   The patient is insured through [facility] .   Per test claim: PA required; PA submitted to above mentioned insurance via CoverMyMeds Key/confirmation #/EOC [id number] Status is pending

## 2023-10-29 NOTE — Telephone Encounter (Signed)
 Pharmacy Patient Advocate Encounter  Received notification from [facility] that Prior Authorization for Emgality  120MG /ML auto-injectors (migraine) has been APPROVED from 10/25/2023 to 10/24/2024   PA #/Case ID/Reference #: [id number]

## 2023-12-13 ENCOUNTER — Other Ambulatory Visit

## 2023-12-13 ENCOUNTER — Encounter: Payer: Self-pay | Admitting: Family Medicine

## 2023-12-13 ENCOUNTER — Ambulatory Visit (INDEPENDENT_AMBULATORY_CARE_PROVIDER_SITE_OTHER): Admitting: Family Medicine

## 2023-12-13 VITALS — BP 117/74 | HR 82 | Temp 97.5°F | Resp 18 | Wt 184.0 lb

## 2023-12-13 DIAGNOSIS — R7303 Prediabetes: Secondary | ICD-10-CM

## 2023-12-13 DIAGNOSIS — R634 Abnormal weight loss: Secondary | ICD-10-CM

## 2023-12-13 DIAGNOSIS — E539 Vitamin B deficiency, unspecified: Secondary | ICD-10-CM

## 2023-12-13 DIAGNOSIS — I951 Orthostatic hypotension: Secondary | ICD-10-CM | POA: Diagnosis not present

## 2023-12-13 DIAGNOSIS — Z136 Encounter for screening for cardiovascular disorders: Secondary | ICD-10-CM

## 2023-12-13 DIAGNOSIS — E559 Vitamin D deficiency, unspecified: Secondary | ICD-10-CM

## 2023-12-13 DIAGNOSIS — Z23 Encounter for immunization: Secondary | ICD-10-CM

## 2023-12-13 DIAGNOSIS — E611 Iron deficiency: Secondary | ICD-10-CM

## 2023-12-13 DIAGNOSIS — Z0000 Encounter for general adult medical examination without abnormal findings: Secondary | ICD-10-CM

## 2023-12-13 DIAGNOSIS — E441 Mild protein-calorie malnutrition: Secondary | ICD-10-CM | POA: Diagnosis not present

## 2023-12-13 DIAGNOSIS — Z0001 Encounter for general adult medical examination with abnormal findings: Secondary | ICD-10-CM

## 2023-12-13 DIAGNOSIS — K582 Mixed irritable bowel syndrome: Secondary | ICD-10-CM | POA: Diagnosis not present

## 2023-12-13 DIAGNOSIS — Z114 Encounter for screening for human immunodeficiency virus [HIV]: Secondary | ICD-10-CM

## 2023-12-13 DIAGNOSIS — M357 Hypermobility syndrome: Secondary | ICD-10-CM | POA: Diagnosis not present

## 2023-12-13 LAB — CBC WITH DIFFERENTIAL/PLATELET
Basophils Absolute: 0.1 K/uL (ref 0.0–0.1)
Basophils Relative: 2.2 % (ref 0.0–3.0)
Eosinophils Absolute: 0 K/uL (ref 0.0–0.7)
Eosinophils Relative: 0.7 % (ref 0.0–5.0)
HCT: 34.6 % — ABNORMAL LOW (ref 36.0–46.0)
Hemoglobin: 11.6 g/dL — ABNORMAL LOW (ref 12.0–15.0)
Lymphocytes Relative: 38.5 % (ref 12.0–46.0)
Lymphs Abs: 1.4 K/uL (ref 0.7–4.0)
MCHC: 33.4 g/dL (ref 30.0–36.0)
MCV: 88.1 fl (ref 78.0–100.0)
Monocytes Absolute: 0.2 K/uL (ref 0.1–1.0)
Monocytes Relative: 6.6 % (ref 3.0–12.0)
Neutro Abs: 1.9 K/uL (ref 1.4–7.7)
Neutrophils Relative %: 52 % (ref 43.0–77.0)
Platelets: 190 K/uL (ref 150.0–400.0)
RBC: 3.93 Mil/uL (ref 3.87–5.11)
RDW: 13 % (ref 11.5–15.5)
WBC: 3.7 K/uL — ABNORMAL LOW (ref 4.0–10.5)

## 2023-12-13 LAB — MICROALBUMIN / CREATININE URINE RATIO
Creatinine,U: 134.8 mg/dL
Microalb Creat Ratio: 5.5 mg/g (ref 0.0–30.0)
Microalb, Ur: 0.7 mg/dL (ref 0.0–1.9)

## 2023-12-13 LAB — HEMOGLOBIN A1C: Hgb A1c MFr Bld: 5.6 % (ref 4.6–6.5)

## 2023-12-13 MED ORDER — METOCLOPRAMIDE HCL 5 MG PO TABS: 5.0000 mg | ORAL_TABLET | Freq: Four times a day (QID) | ORAL | 1 refills | Status: AC | PRN

## 2023-12-13 NOTE — Progress Notes (Addendum)
 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 Experiencing vomiting and nausea since July. Currently on prucalopride, lubiprostone, and pyridostigmine for GI motility. - Prescribe metoclopramide  for GI motility - Monitor symptoms and adjust treatment as needed  Irritable bowel syndrome Current management includes prucalopride, lubiprostone, and pyridostigmine. Recent decrease in bowel movement frequency. - Continue current GI motility medications - Monitor bowel movement frequency and adjust treatment as needed  Adult Wellness Visit Routine annual physical examination. Blood pressure is 117/74 mmHg. BMI is 27, categorized as overweight. Vitals are stable. - Perform routine annual physical examination - Discuss general health and wellness - Ensure continued follow-up with nutrition  Nutritional deficiencies (vitamin D , iron, B12, folate) Currently on multivitamin and supplements including zinc, calcium, vitamin D , vitamin C, iron, and B2. - Order vitamin D , B complex, folate, and iron labs - Continue current supplementation regimen  Overweight BMI of 27. Weight management is important given her complex medical history and nutritional challenges. - Continue monitoring weight and nutritional intake  Prediabetes Previous hemoglobin A1c indicated mild prediabetes. Dietary adjustments have been made to reduce sugar intake. 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 - Continue gabapentin for pain management - Consider pregabalin if further pain control is needed  Dysautonomia (postural orthostatic tachycardia syndrome) Recent episodes of syncope. Currently on  pyridostigmine. Blood pressure and heart rate are stable.  EKG reassuring without abnormalities sinus rhythm. - Order EKG to assess cardiac rhythm - Continue pyridostigmine for POTS management  Small fiber neuropathy Symptoms managed by gabapentin. Symptoms include tingling and sensations in the feet. - Continue gabapentin for neuropathic symptoms - Consider pregabalin if symptoms worsen  Asthma Nimotop inhaler available for as-needed use. 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 - Continue hydroxychloroquine for mast cell stabilization  Chronic mild lower extremity edema Managed with compression stockings. No significant changes in edema status. - Continue use of compression stockings - Monitor for any changes in edema  General Health Maintenance Routine health maintenance discussed, including screenings and lifestyle modifications. - Order lipid panel for ischemic vascular disease screening  Follow-up Follow-up plans discussed to ensure comprehensive care and monitoring of her complex medical conditions. - Follow up with motility specialist in October - Continue regular follow-up appointments for ongoing management     Medications Discontinued During This Encounter  Medication Reason   Naltrexone HCl POWD    Omega-3 Fatty Acids (FISH OIL PO)     Patient Counseling(The following topics were reviewed and/or handout was given):  -Nutrition: Stressed importance of moderation in sodium/caffeine intake, saturated fat and cholesterol, caloric balance, sufficient intake of fresh fruits, vegetables, and fiber.  -Stressed the importance of regular exercise.   -Substance Abuse: Discussed cessation/primary prevention of tobacco, alcohol, or other drug use; driving or other dangerous activities under the influence; availability of treatment for abuse.   -Injury prevention: Discussed  safety belts, safety helmets, smoke detector, smoking near bedding or upholstery.   -Sexuality: Discussed sexually transmitted diseases, partner selection, use of condoms, avoidance of unintended pregnancy and contraceptive alternatives.   -Dental health: Discussed importance of regular tooth brushing, flossing, and dental visits.  -Health maintenance and immunizations reviewed. Please refer to Health maintenance section.  Return in  about 6 months (around 06/11/2024) for weight assessment.        Subjective:   Encounter date: 12/13/2023  Chief Complaint  Patient presents with   Annual Exam    Pt is not fasting today   HM due- vaccinations and cervical screening ( eport request [facility])    Discussed the use of AI scribe software for clinical note transcription with the patient, who gave verbal consent to proceed.  History of Present Illness Shelby Dyer is a 28 year old female who presents for a routine annual physical exam.  Gastrointestinal motility and dumping syndrome - Recent diagnosis of dumping syndrome without typical symptoms - Decreased bowel movement frequency, now once every three days - Persistent vomiting since July, sometimes occurring four hours after eating, associated with nausea - Continues to consume liquids - Weight loss present - Fatigue associated with gastrointestinal symptoms - Current medications for gastrointestinal motility include prucalopride, Lubiprostone, and pyridostigmine - Diet managed with reduced sugar intake, homemade soups, and applesauce - Supplements include multivitamins, zinc, calcium, vitamin D , vitamin C, iron, and vitamin B2  Syncope and postural orthostatic tachycardia syndrome (pots) - Episodes of passing out - History of POTS - Recently returned to the gym to maintain muscle mass - Pyridostigmine prescribed by cardiologist for both gastrointestinal motility and POTS management  Hypermobility syndrome and small  fiber neuropathy - History of hypermobility syndrome - Stable pain related to hypermobility - Small fiber neuropathy with symptoms of tingling and sensations of bugs crawling over feet - Gabapentin taken three times daily for neuropathic symptoms  Headache management - Headaches currently stable on Emgality  - Previously trialed low dose naltrexone, discontinued due to lack of efficacy and cost  Mast cell stabilization and allergic symptoms - Hydroxychloroquine used for mast cell stabilization - No major allergic flares  Asthma - Asthma stable on Xolair - Rare use of inhalers  Edema - Mild edema present for a long duration without worsening - Compression stockings used for management  Gynecologic and menstrual history - No menses due to use of MIV contraceptive  Vision - Excellent vision  Oral health - History of gingivitis - Managed with regular dental follow-ups  Prediabetes - History of prediabetes - Diet managed by reducing sugar intake       12/13/2023   10:29 AM 06/27/2023    8:07 AM 05/02/2023    4:37 PM 12/10/2022   11:16 AM 11/06/2021    1:56 PM  Depression screen PHQ 2/9  Decreased Interest 0 0 0 0 0  Down, Depressed, Hopeless 0 0 0 0 0  PHQ - 2 Score 0 0 0 0 0  Altered sleeping 1   1 2   Tired, decreased energy 2   1 2   Change in appetite 0   1 3  Feeling bad or failure about yourself  0   0 0  Trouble concentrating 0   0 0  Moving slowly or fidgety/restless 0   0 1  Suicidal thoughts 0   0 0  PHQ-9 Score 3   3 8   Difficult doing work/chores Not difficult at all   Not difficult at all Somewhat difficult       12/13/2023   10:29 AM 12/10/2022   11:16 AM 11/06/2021    1:57 PM  GAD 7 : Generalized Anxiety Score  Nervous, Anxious, on Edge 1 1 0  Control/stop worrying 2 0 0  Worry too much - different things 1 0 0  Trouble relaxing 0  0 0  Restless 0 0 0  Easily annoyed or irritable 1 0 0  Afraid - awful might happen 0 0 0  Total GAD 7 Score 5 1 0   Anxiety Difficulty Not difficult at all Not difficult at all Not difficult at all    Health Maintenance Due  Topic Date Due   HIV Screening  Never done   Hepatitis B Vaccines 19-59 Average Risk (1 of 3 - 19+ 3-dose series) Never done   Cervical Cancer Screening (Pap smear)  Never done   HPV VACCINES (1 - 3-dose SCDM series) Never done   Influenza Vaccine  Never done   COVID-19 Vaccine (1 - 2024-25 season) Never done      PMH:  The following were reviewed and entered/updated in epic: Past Medical History:  Diagnosis Date   Allergy 2000   Hay fever, mcas, eczema, etc   Anemia 2020   Off/on low hct and/or low hemoglobin   Anxiety    Asthma Early 2021   Chronic idiopathic constipation    Chronic migraine with aura    Depression 2012?   Hypermobile Ehlers-Danlos syndrome    Idiopathic hypersomnolence    Mast cell activation syndrome (HCC)    Myofascial pain syndrome, cervical 11/14/2021   Needle stick, hypodermic, accidental, initial encounter 01/01/2022   Peripheral neuropathy    small fiber, based on sweat test   POTS (postural orthostatic tachycardia syndrome)    Syncope and collapse Dec 2021   Ulcer 2018   Stomach ulcer    Patient Active Problem List   Diagnosis Date Noted   Mild protein-calorie malnutrition (HCC) 12/13/2023   Vitamin D  deficiency 12/13/2023   Vitamin B deficiency 12/13/2023   Iron deficiency 12/13/2023   Generalized intestinal dysmotility 03/04/2023   Prediabetes 03/04/2023   Weight loss, non-intentional 12/28/2022   Migraine with aura and with status migrainosus, not intractable 11/20/2022   Chronic pain of multiple joints 10/30/2022   Class 2 obesity in adult 10/30/2022   Foreign body of left index finger with infection 01/19/2022   Patellofemoral pain syndrome of both knees 11/14/2021   Myofascial pain syndrome, cervical 11/14/2021   Ehlers-Danlos syndrome 11/06/2021   PTSD (post-traumatic stress disorder) 11/06/2021   Chronic idiopathic  constipation 10/10/2021   Hip pain 06/13/2021   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   Dysautonomia orthostatic hypotension syndrome 05/17/2021   Mast cell activation (HCC) 05/17/2021   Chronic migraine without aura, with intractable migraine, so stated, with status migrainosus 05/17/2021   IBS (irritable bowel syndrome) 04/03/2021   Median nerve neuritis, right 06/25/2019   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   Idiopathic hypersomnolence 06/10/2014    Past Surgical History:  Procedure Laterality Date   adenoids removed     double scope      ESOPHAGOGASTRODUODENOSCOPY ENDOSCOPY  10/02/2023   Tub place in both ear  Bilateral    When she was 28 years of age   WISDOM TOOTH EXTRACTION  2006   All 4    Family History  Problem Relation Age of Onset   Migraines Mother    Anxiety disorder Mother    Depression Mother    Ehlers-Danlos syndrome Mother    Atrial fibrillation Father    Arrhythmia Father        A-fib   Other Sister    Anxiety disorder Sister    Depression Sister    Learning disabilities Sister    Other Sister        POTS   Obesity Sister  Hypertension Brother    Asthma Brother    Learning disabilities Brother    Depression Brother    Hypertension Brother    Learning disabilities Brother    Obesity Brother    Migraines Maternal Grandmother    Kidney disease Maternal Grandfather    Stroke Maternal Grandfather    Breast cancer Paternal Grandmother    Cancer Paternal Grandmother    Varicose Veins Paternal Grandmother    Cancer Paternal Grandfather    Early death Maternal Aunt    Colon cancer Neg Hx    Stomach cancer Neg Hx    Liver cancer Neg Hx    Pancreatic cancer Neg Hx     Medications- reviewed and updated Outpatient Medications Prior to Visit  Medication Sig Dispense Refill   acetaminophen (TYLENOL) 500 MG tablet Take 1,000 mg by mouth as needed.     EPINEPHrine  (PRIMATENE  MIST) 0.125 MG/ACT AERO 1-2 puffs as needed.     EPINEPHrine  0.3  mg/0.3 mL IJ SOAJ injection Inject 0.3 mg into the muscle as needed for anaphylaxis.     levonorgestrel (MIRENA) 20 MCG/24HR IUD 1 each by Intrauterine route once.     Multiple Vitamin (MULTIVITAMIN) tablet Take 1 tablet by mouth daily.     Multiple Vitamins-Minerals (ZINC PO) Take by mouth.     Nutritional Supplements (KATE FARMS STANDARD 1.0) LIQD 1 Bottle TID. 29250 mL 11   Polyethylene Glycol 3350 (MIRALAX PO) Take by mouth.     Prucalopride Succinate  (MOTEGRITY ) 2 MG TABS Take 1 tablet by mouth in the morning.     UNABLE TO FIND Take 1,000 mg by mouth daily. Med Name: VITASSIUM brand salt capsules     Ascorbic Acid (VITAMIN C PO) Take 500-2,000 mg by mouth daily in the afternoon.     atenolol (TENORMIN) 25 MG tablet Take by mouth daily. 25 mg in the morning and 12.5 mg mid day.     cetirizine (ZYRTEC) 10 MG tablet Take 20 mg by mouth in the morning and at bedtime. (Patient taking differently: Take 20 mg by mouth every morning.)     Coenzyme Q10 (COQ-10 PO) Take 400 mg by mouth daily at 6 (six) AM.     famotidine (PEPCID) 20 MG tablet Take 20 mg by mouth 2 (two) times daily.     gabapentin (NEURONTIN) 100 MG capsule Take 100 mg by mouth 3 (three) times daily.     Galcanezumab -gnlm (EMGALITY ) 120 MG/ML SOAJ Inject 120 mg into the skin every 30 (thirty) days. PLEASE USE COPAY CARD BIN: [id number] PCN [id number] GRP [id number] ID [id number] Expires 03/26/2023 3 mL 11   hydroxychloroquine (PLAQUENIL) 200 MG tablet Take 200 mg by mouth 2 (two) times daily.     hydrOXYzine (ATARAX) 25 MG tablet Take 25-50 mg by mouth every 8 (eight) hours as needed (allergies).     lubiprostone (AMITIZA) 8 MCG capsule Take 8 mcg by mouth 2 (two) times daily with a meal.     ondansetron  (ZOFRAN ) 4 MG tablet Take 1 tablet (4 mg total) by mouth every 8 (eight) hours as needed for nausea or vomiting. 20 tablet 0   pyridostigmine (MESTINON) 60 MG tablet Take 60 mg by mouth in the morning and at bedtime.     QUERCETIN PO Take 3,000  mg by mouth daily in the afternoon.     Riboflavin 100 MG TABS Take 200 mg by mouth 2 (two) times daily.     Ubrogepant  (UBRELVY ) 100 MG TABS Take 1 tablet (100 mg  total) by mouth every 2 (two) hours as needed. Maximum 200mg  a day. Please use copay card: BIN [id number] PCN [id number] GRP [id number] ID [id number] 16 tablet 11   UNABLE TO FIND Take by mouth 2 (two) times daily. Med Name: MAGNESIUM COMPLEX  200 mg AM and 400 mg PM     XOLAIR 150 MG/ML prefilled syringe Inject 300 mg into the skin every 14 (fourteen) days.     Naltrexone HCl POWD 1 mg by mouth daily for 30 days (Patient not taking: Reported on 12/13/2023)     Omega-3 Fatty Acids (FISH OIL PO) Take by mouth daily at 6 (six) AM. (Patient not taking: Reported on 12/13/2023)     No facility-administered medications prior to visit.    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  Tobacco Use   Smoking status: Former    Current packs/day: 0.00    Average packs/day: 0.5 packs/day for 2.0 years (1.0 ttl pk-yrs)    Types: Cigarettes    Start date: 03/26/2014    Quit date: 03/26/2016    Years since quitting: 7.7   Smokeless tobacco: Never  Vaping Use   Vaping status: Never Used  Substance and Sexual Activity   Alcohol use: Yes    Alcohol/week: 1.0 - 2.0 standard drink of alcohol    Types: 1 - 2 Standard drinks or equivalent per week    Comment: Very rarely drink cocktail   Drug use: Not Currently    Types: Marijuana    Comment: Used to smoke in college   Sexual activity: Yes    Birth control/protection: I.U.D.     Comment: 1 partner - my husband  Other Topics Concern   Not on file  Social History Narrative   Caffeine: none   Social Drivers of Corporate investment banker Strain: Low Risk  (12/12/2023)   Overall Financial Resource Strain (CARDIA)    Difficulty of Paying Living Expenses: Not hard at all  Food Insecurity: No Food Insecurity (12/12/2023)   Hunger Vital Sign    Worried About Running Out of Food in the Last Year: Never true    Ran Out of Food in the Last Year: Never true  Transportation Needs: No Transportation Needs (12/12/2023)   PRAPARE - Administrator, Civil Service (Medical): No    Lack of Transportation (Non-Medical): No  Physical Activity: Sufficiently Active (12/12/2023)   Exercise Vital Sign    Days of Exercise per Week: 5 days    Minutes of Exercise per Session: 60 min  Stress: Stress Concern Present (12/12/2023)   [employer] of Occupational Health - Occupational Stress Questionnaire    Feeling of Stress: To some extent  Social Connections: Socially Integrated (12/12/2023)   Social Connection and Isolation Panel    Frequency of Communication with Friends and Family: Three times a week    Frequency of Social Gatherings with Friends and Family: Three times a week    Attends Religious Services: 1 to 4 times per  year    Active Member of Clubs or Organizations: Yes    Attends Banker Meetings: 1 to 4 times per year    Marital Status: Married           Objective:  Physical Exam: BP 117/74 (BP Location: Left Arm, Patient Position: Sitting, Cuff Size: Large)   Pulse 82   Temp (!) 97.5 F (36.4 C) (Temporal)   Resp 18   Wt 184 lb (83.5 kg)   SpO2 100%   BMI 27.17 kg/m   Body mass index is 27.17 kg/m. Wt Readings from Last 3 Encounters:  12/13/23 184 lb (83.5 kg)  10/11/23 192 lb (87.1 kg)  06/27/23 203 lb 12.8 oz (92.4 kg)    Physical Exam VITALS: BP- 117/74 MEASUREMENTS: Weight- 184, BMI- 27.0. GENERAL: Alert, cooperative,  well developed, no acute distress HEENT: Normocephalic, normal oropharynx, moist mucous membranes CHEST: Clear to auscultation bilaterally, no wheezes, rhonchi, or crackles CARDIOVASCULAR: Normal heart rate and rhythm, S1 and S2 normal without murmurs ABDOMEN: Soft, mild general tenderness EXTREMITIES: Pitting 1+ lower extremity edema in bilaleral lower extremties NEUROLOGICAL: Cranial nerves grossly intact, moves all extremities without gross motor or sensory deficit  Physical Exam ECG sinus rhythm with no arrhythmia or QT abnormalities show a copy     Prior labs:   No results found for this or any previous visit (from the past 2160 hours).  No results found for: CHOL No results found for: HDL No results found for: LDLCALC No results found for: TRIG No results found for: CHOLHDL No results found for: LDLDIRECT  Last metabolic panel Lab Results  Component Value Date   GLUCOSE 97 05/09/2023   NA 139 05/09/2023   K 4.0 05/09/2023   CL 104 05/09/2023   CO2 28 05/09/2023   BUN 11 05/09/2023   CREATININE 0.72 05/09/2023   GFR 114.78 05/09/2023   CALCIUM 9.6 05/09/2023   PROT 7.6 05/09/2023   ALBUMIN 4.7 05/09/2023   LABGLOB 2.7 02/19/2023   BILITOT 0.3 05/09/2023   ALKPHOS 31 (L) 05/09/2023   AST 19 05/09/2023   ALT 12 05/09/2023    Lab Results  Component Value Date   HGBA1C 5.8 (H) 02/19/2023    Last CBC Lab Results  Component Value Date   WBC 4.9 05/09/2023   HGB 12.5 05/09/2023   HCT 38.2 05/09/2023   MCV 89.6 05/09/2023   RDW 13.1 05/09/2023   PLT 248.0 05/09/2023    Lab Results  Component Value Date   TSH 1.770 02/19/2023    No results found for: PSA1, PSA  Last vitamin D  Lab Results  Component Value Date   VD25OH 29.8 (L) 02/19/2023    Lab Results  Component Value Date   PROTEINUR NEGATIVE 12/27/2022    No results found for: LABMICR, MICROALBUR   At today's visit, we discussed treatment options, associated risk and  benefits, and engage in counseling as needed.  Additionally the following were reviewed: Past medical records, past medical and surgical history, family and social background, as well as relevant laboratory results, imaging findings, and specialty notes, where applicable.  This message was generated using dictation software, and as a result, it may contain unintentional typos or errors.  Nevertheless, extensive effort was made to accurately convey at the pertinent aspects of the patient visit.    There may have been are other unrelated non-urgent complaints, but due to the busy schedule and the amount of time already spent with her, time does not permit to  address these issues at today's visit. Another appointment may have or has been requested to review these additional issues.     Arvella Hummer, MD, MS

## 2023-12-13 NOTE — Addendum Note (Signed)
 Addended by: EUGENIE ULANDA CROME on: 12/13/2023 11:36 AM   Modules accepted: Orders

## 2023-12-13 NOTE — Addendum Note (Signed)
 Addended by: SEBASTIAN RIGHTER B on: 12/13/2023 11:28 AM   Modules accepted: Orders

## 2023-12-14 LAB — COMPREHENSIVE METABOLIC PANEL WITH GFR
AG Ratio: 2.2 (calc) (ref 1.0–2.5)
ALT: 9 U/L (ref 6–29)
AST: 14 U/L (ref 10–30)
Albumin: 4.7 g/dL (ref 3.6–5.1)
Alkaline phosphatase (APISO): 28 U/L — ABNORMAL LOW (ref 31–125)
BUN: 15 mg/dL (ref 7–25)
CO2: 24 mmol/L (ref 20–32)
Calcium: 9.2 mg/dL (ref 8.6–10.2)
Chloride: 104 mmol/L (ref 98–110)
Creat: 0.69 mg/dL (ref 0.50–0.96)
Globulin: 2.1 g/dL (ref 1.9–3.7)
Glucose, Bld: 90 mg/dL (ref 65–99)
Potassium: 4.1 mmol/L (ref 3.5–5.3)
Sodium: 136 mmol/L (ref 135–146)
Total Bilirubin: 0.4 mg/dL (ref 0.2–1.2)
Total Protein: 6.8 g/dL (ref 6.1–8.1)
eGFR: 122 mL/min/1.73m2 (ref >=60)

## 2023-12-14 LAB — LIPID PANEL
Cholesterol: 147 mg/dL (ref <200)
HDL: 63 mg/dL (ref >=50)
LDL Cholesterol (Calc): 71 mg/dL
Non-HDL Cholesterol (Calc): 84 mg/dL (ref <130)
Total CHOL/HDL Ratio: 2.3 (calc) (ref <5.0)
Triglycerides: 46 mg/dL (ref <150)

## 2023-12-14 LAB — MAGNESIUM: Magnesium: 2 mg/dL (ref 1.5–2.5)

## 2023-12-14 LAB — VITAMIN B12: Vitamin B-12: 377 pg/mL (ref 200–1100)

## 2023-12-14 LAB — TSH RFX ON ABNORMAL TO FREE T4: TSH: 2.24 u[IU]/mL (ref 0.450–4.500)

## 2023-12-14 LAB — FOLATE: Folate: 15.2 ng/mL

## 2023-12-18 ENCOUNTER — Ambulatory Visit: Payer: Self-pay | Admitting: Family Medicine

## 2023-12-18 DIAGNOSIS — E531 Pyridoxine deficiency: Secondary | ICD-10-CM

## 2023-12-18 MED ORDER — B COMPLEX VITAMINS PO CAPS: 1.0000 | ORAL_CAPSULE | Freq: Every day | ORAL | 3 refills | Status: AC

## 2023-12-23 LAB — HIV ANTIBODY (ROUTINE TESTING W REFLEX)
HIV 1&2 Ab, 4th Generation: NONREACTIVE
HIV FINAL INTERPRETATION: NEGATIVE

## 2023-12-23 LAB — URINALYSIS W MICROSCOPIC + REFLEX CULTURE
Bilirubin Urine: NEGATIVE
Glucose, UA: NEGATIVE
Hgb urine dipstick: NEGATIVE
Ketones, ur: NEGATIVE
Leukocyte Esterase: NEGATIVE
Nitrites, Initial: NEGATIVE
Protein, ur: NEGATIVE
Specific Gravity, Urine: 1.024 (ref 1.001–1.035)
pH: 5.5 (ref 5.0–8.0)

## 2023-12-23 LAB — VITAMIN B3
Nicotinamide: <20 ng/mL
Nicotinic Acid: <20 ng/mL

## 2023-12-23 LAB — VITAMIN B1: Vitamin B1 (Thiamine): 12 nmol/L (ref 8–30)

## 2023-12-23 LAB — NO CULTURE INDICATED

## 2023-12-23 LAB — IRON,TIBC AND FERRITIN PANEL
%SAT: 23 % (ref 16–45)
Ferritin: 114 ng/mL (ref 16–154)
Iron: 61 ug/dL (ref 40–190)
TIBC: 264 ug/dL (ref 250–450)

## 2023-12-23 LAB — VITAMIN B2(RIBOFLAVIN),PLASMA: Vitamin B2(Riboflavin),Plasma: 70.1 nmol/L — ABNORMAL HIGH (ref 6.2–39.0)

## 2023-12-23 LAB — BIOTIN (VITAMIN B7): Biotin (Vitamin B7): 2378.7 pg/mL (ref 221.0–3004.0)

## 2023-12-23 LAB — VITAMIN B6: Vitamin B6: 24.1 ng/mL — ABNORMAL HIGH (ref 2.1–21.7)

## 2023-12-23 LAB — VITAMIN D 1,25 DIHYDROXY
Vitamin D 1, 25 (OH)2 Total: 29 pg/mL (ref 18–72)
Vitamin D2 1, 25 (OH)2: <8 pg/mL
Vitamin D3 1, 25 (OH)2: 29 pg/mL

## 2023-12-23 LAB — VITAMIN B5 (PANTOTHENIC ACID): Vitamin B5(Pantothenic Acid): 73 ng/mL (ref <275)

## 2023-12-24 ENCOUNTER — Ambulatory Visit: Payer: Self-pay | Admitting: Family Medicine

## 2024-01-14 ENCOUNTER — Ambulatory Visit: Admitting: Family Medicine

## 2024-01-16 ENCOUNTER — Telehealth: Payer: Self-pay

## 2024-01-16 ENCOUNTER — Encounter: Payer: Self-pay | Admitting: Family Medicine

## 2024-01-16 ENCOUNTER — Ambulatory Visit: Admitting: Family Medicine

## 2024-01-16 VITALS — BP 111/69 | HR 66 | Temp 97.5°F | Resp 18 | Wt 180.6 lb

## 2024-01-16 DIAGNOSIS — Z23 Encounter for immunization: Secondary | ICD-10-CM | POA: Diagnosis not present

## 2024-01-16 DIAGNOSIS — R2681 Unsteadiness on feet: Secondary | ICD-10-CM | POA: Diagnosis not present

## 2024-01-16 DIAGNOSIS — Z0279 Encounter for issue of other medical certificate: Secondary | ICD-10-CM

## 2024-01-16 DIAGNOSIS — G90A Postural orthostatic tachycardia syndrome (POTS): Secondary | ICD-10-CM | POA: Diagnosis not present

## 2024-01-16 DIAGNOSIS — K30 Functional dyspepsia: Secondary | ICD-10-CM | POA: Diagnosis not present

## 2024-01-16 DIAGNOSIS — I951 Orthostatic hypotension: Secondary | ICD-10-CM | POA: Diagnosis not present

## 2024-01-16 DIAGNOSIS — Z883 Allergy status to other anti-infective agents status: Secondary | ICD-10-CM

## 2024-01-16 NOTE — Progress Notes (Signed)
 Assessment  Assessment/Plan:  Assessment and Plan Assessment & Plan Autonomic orthostatic Hypotension Syndrome, Postural Orthostatic Tachycardia Syndrome, Gait Instability, and Fall Risk Requires workplace accommodations due to conditions leading to gait instability and increased fall risk, including dysautonomic orthostatic hypotension syndrome and postural orthostatic tachycardia syndrome (POTS). Uses a cane for mobility and requires environmental accommodations due to exposures at work, including sound, chemicals, heat, cold, and respiratory concerns. Experiences unpredictable symptoms and difficulty standing or sitting for long periods. - Certify ADA accommodations for one year, including use of a cane and avoidance of specific environmental hazards such as betadine. - Submit FMLA paperwork allowing for doctor appointments up to four times a month and call outs up to three times a week.  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 Following up with nutrition and gastroenterology specialists. - Continue follow-up with nutrition and gastroenterology specialists. - Monitor weight trends and nutritional status.  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 Advised to avoid betadine and related products. - Add betadine to allergy list. - Ensure workplace accommodations include avoidance of betadine exposure.  General Health Maintenance Due for a flu vaccination and plans to receive it today. - Administer flu vaccine.     There are no discontinued medications.  No follow-ups on file.        Subjective:   Encounter date: 01/16/2024  Chief Complaint  Patient presents with   [facility]    Medical Management of Chronic Issues    Discussed the use of AI scribe software for clinical note transcription  with the patient, who gave verbal consent to proceed.  History of Present Illness 12/13/2023  Shelby Dyer is a 28 year old female who presents for a routine annual physical exam.  Gastrointestinal motility and dumping syndrome - Recent diagnosis of dumping syndrome without typical symptoms - Decreased bowel movement frequency, now once every three days - Persistent vomiting since July, sometimes occurring four hours after eating, associated with nausea - Continues to consume liquids - Weight loss present - Fatigue associated with gastrointestinal symptoms - Current medications for gastrointestinal motility include prucalopride, Lubiprostone, and pyridostigmine - Diet managed with reduced sugar intake, homemade soups, and applesauce - Supplements include multivitamins, zinc, calcium, vitamin D , vitamin C, iron, and vitamin B2  Syncope and postural orthostatic tachycardia syndrome (pots) - Episodes of passing out - History of POTS - Recently returned to the gym to maintain muscle mass - Pyridostigmine prescribed by cardiologist for both gastrointestinal motility and POTS management  Hypermobility syndrome and small fiber neuropathy - History of hypermobility syndrome - Stable pain related to hypermobility - Small fiber neuropathy with symptoms of tingling and sensations of bugs crawling over feet - Gabapentin taken three times daily for neuropathic symptoms  Headache management - Headaches currently stable on Emgality  - Previously trialed low dose naltrexone, discontinued due to lack of efficacy and cost  Mast cell stabilization and allergic symptoms - Hydroxychloroquine used for mast cell stabilization - No major allergic flares  Asthma - Asthma stable on Xolair - Rare use of inhalers  Edema - Mild edema present for a long duration without worsening - Compression stockings used for management  Gynecologic and menstrual history - No menses due to use of MIV  contraceptive  Vision - Excellent vision  Oral health - History of gingivitis - Managed with regular dental follow-ups  Prediabetes - History of prediabetes -  Diet managed by reducing sugar intake  Some 01/16/2024  Shelby Dyer is a 28 year old female with dysautonomic orthostatic hypertension syndrome and POTS who presents for follow-up on ADA and FMLA accommodations.  Gait instability and fall risk - Gait instability with increased risk of falls - Uses a cane for ambulation, primarily at work - Requires environmental modifications at work to mitigate exposure to sound, chemicals, heat, cold, and respiratory irritants - Struggles with prolonged standing or sitting, with symptoms occurring unpredictably  Orthostatic intolerance and dysautonomia - History of dysautonomic orthostatic hypertension syndrome and postural orthostatic tachycardia syndrome (POTS) - Symptoms exacerbated by prolonged standing or sitting - No new or worsening dizziness  Allergic reactions and anaphylaxis - Experienced an anaphylactic reaction at work attributed to Betadine exposure during a surgical procedure, requiring urgent care - Requests ADA accommodations to avoid Betadine and Betadine-containing products - Has an adequate supply of EpiPens for allergic reactions  Gastrointestinal symptoms and weight loss - History of gastroparesis - Weight loss of four pounds over the last month due to difficulty tolerating oral intake - Follows up with nutrition and gastroenterology specialists - Weight higher than expected today, possibly due to heavier clothing - No new or worsening abdominal pain, nausea, or vomiting  Workplace accommodations and leave - ADA accommodations needed for gait instability, fall risk, and avoidance of Betadine - FMLA recently expired after one year of use - Seeks FMLA extension to accommodate up to four doctor appointments monthly and potential call outs up to three times a  week, though typically does not require this frequency  Cardiopulmonary symptoms - No new or worsening chest pain or shortness of breath       01/16/2024    1:47 PM 12/13/2023   10:29 AM 06/27/2023    8:07 AM 05/02/2023    4:37 PM 12/10/2022   11:16 AM  Depression screen PHQ 2/9  Decreased Interest 0 0 0 0 0  Down, Depressed, Hopeless 0 0 0 0 0  PHQ - 2 Score 0 0 0 0 0  Altered sleeping  1   1  Tired, decreased energy  2   1  Change in appetite  0   1  Feeling bad or failure about yourself   0   0  Trouble concentrating  0   0  Moving slowly or fidgety/restless  0   0  Suicidal thoughts  0   0  PHQ-9 Score  3   3  Difficult doing work/chores  Not difficult at all   Not difficult at all       12/13/2023   10:29 AM 12/10/2022   11:16 AM 11/06/2021    1:57 PM  GAD 7 : Generalized Anxiety Score  Nervous, Anxious, on Edge 1 1 0  Control/stop worrying 2 0 0  Worry too much - different things 1 0 0  Trouble relaxing 0 0 0  Restless 0 0 0  Easily annoyed or irritable 1 0 0  Afraid - awful might happen 0 0 0  Total GAD 7 Score 5 1 0  Anxiety Difficulty Not difficult at all Not difficult at all Not difficult at all    Health Maintenance Due  Topic Date Due   Influenza Vaccine  Never done   COVID-19 Vaccine (1 - 2025-26 season) Never done      PMH:  The following were reviewed and entered/updated in epic: Past Medical History:  Diagnosis Date   Allergy 2000   Hay  fever, mcas, eczema, etc   Anemia 2020   Off/on low hct and/or low hemoglobin   Anxiety    Asthma Early 2021   Chronic idiopathic constipation    Chronic migraine with aura    Depression 2012?   Hypermobile Ehlers-Danlos syndrome    Idiopathic hypersomnolence    Mast cell activation syndrome    Myofascial pain syndrome, cervical 11/14/2021   Needle stick, hypodermic, accidental, initial encounter 01/01/2022   Peripheral neuropathy    small fiber, based on sweat test   POTS (postural orthostatic  tachycardia syndrome)    Syncope and collapse Dec 2021   Ulcer 2018   Stomach ulcer    Patient Active Problem List   Diagnosis Date Noted   Mild protein-calorie malnutrition 12/13/2023   Vitamin D  deficiency 12/13/2023   Vitamin B deficiency 12/13/2023   Iron deficiency 12/13/2023   Generalized intestinal dysmotility 03/04/2023   Prediabetes 03/04/2023   Weight loss, non-intentional 12/28/2022   Migraine with aura and with status migrainosus, not intractable 11/20/2022   Chronic pain of multiple joints 10/30/2022   Class 2 obesity in adult 10/30/2022   Foreign body of left index finger with infection 01/19/2022   Patellofemoral pain syndrome of both knees 11/14/2021   Myofascial pain syndrome, cervical 11/14/2021   Ehlers-Danlos syndrome 11/06/2021   PTSD (post-traumatic stress disorder) 11/06/2021   Chronic idiopathic constipation 10/10/2021   Hip pain 06/13/2021   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   Dysautonomia orthostatic hypotension syndrome 05/17/2021   Mast cell activation 05/17/2021   Chronic migraine without aura, with intractable migraine, so stated, with status migrainosus 05/17/2021   IBS (irritable bowel syndrome) 04/03/2021   Median nerve neuritis, right 06/25/2019   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   Idiopathic hypersomnolence 06/10/2014    Past Surgical History:  Procedure Laterality Date   adenoids removed     double scope      ESOPHAGOGASTRODUODENOSCOPY ENDOSCOPY  10/02/2023   Tub place in both ear  Bilateral    When she was 28 years of age   WISDOM TOOTH EXTRACTION  2006   All 4    Family History  Problem Relation Age of Onset   Migraines Mother    Anxiety disorder Mother    Depression Mother    Ehlers-Danlos syndrome Mother    Atrial fibrillation Father    Arrhythmia Father        A-fib   Other Sister    Anxiety disorder Sister    Depression Sister    Learning disabilities Sister    Other Sister        POTS   Obesity Sister     Hypertension Brother    Asthma Brother    Learning disabilities Brother    Depression Brother    Hypertension Brother    Learning disabilities Brother    Obesity Brother    Migraines Maternal Grandmother    Kidney disease Maternal Grandfather    Stroke Maternal Grandfather    Breast cancer Paternal Grandmother    Cancer Paternal Grandmother    Varicose Veins Paternal Grandmother    Cancer Paternal Grandfather    Early death Maternal Aunt    Colon cancer Neg Hx    Stomach cancer Neg Hx    Liver cancer Neg Hx    Pancreatic cancer Neg Hx     Medications- reviewed and updated Outpatient Medications Prior to Visit  Medication Sig Dispense Refill   acetaminophen (TYLENOL) 500 MG tablet Take 1,000 mg by mouth as needed.  Ascorbic Acid (VITAMIN C PO) Take 500-2,000 mg by mouth daily in the afternoon.     atenolol (TENORMIN) 25 MG tablet Take by mouth daily. 25 mg in the morning and 12.5 mg mid day.     b complex vitamins capsule Take 1 capsule by mouth daily. 90 capsule 3   cetirizine (ZYRTEC) 10 MG tablet Take 20 mg by mouth in the morning and at bedtime. (Patient taking differently: Take 20 mg by mouth every morning.)     Coenzyme Q10 (COQ-10 PO) Take 400 mg by mouth daily at 6 (six) AM.     EPINEPHrine  (PRIMATENE  MIST) 0.125 MG/ACT AERO 1-2 puffs as needed.     EPINEPHrine  0.3 mg/0.3 mL IJ SOAJ injection Inject 0.3 mg into the muscle as needed for anaphylaxis.     famotidine (PEPCID) 20 MG tablet Take 20 mg by mouth 2 (two) times daily.     gabapentin (NEURONTIN) 100 MG capsule Take 100 mg by mouth 3 (three) times daily.     Galcanezumab -gnlm (EMGALITY ) 120 MG/ML SOAJ Inject 120 mg into the skin every 30 (thirty) days. PLEASE USE COPAY CARD BIN: [id number] PCN [id number] GRP [id number] ID [id number] Expires 03/26/2023 3 mL 11   hydroxychloroquine (PLAQUENIL) 200 MG tablet Take 200 mg by mouth 2 (two) times daily.     hydrOXYzine (ATARAX) 25 MG tablet Take 25-50 mg by mouth every 8 (eight)  hours as needed (allergies).     levonorgestrel (MIRENA) 20 MCG/24HR IUD 1 each by Intrauterine route once.     lubiprostone (AMITIZA) 8 MCG capsule Take 8 mcg by mouth 2 (two) times daily with a meal.     metoCLOPramide  (REGLAN ) 5 MG tablet Take 1 tablet (5 mg total) by mouth every 6 (six) hours as needed for nausea or vomiting. 20 tablet 1   Multiple Vitamin (MULTIVITAMIN) tablet Take 1 tablet by mouth daily.     Multiple Vitamins-Minerals (ZINC PO) Take by mouth.     Nutritional Supplements (KATE FARMS STANDARD 1.0) LIQD 1 Bottle TID. 70749 mL 11   ondansetron  (ZOFRAN ) 4 MG tablet Take 1 tablet (4 mg total) by mouth every 8 (eight) hours as needed for nausea or vomiting. 20 tablet 0   Polyethylene Glycol 3350 (MIRALAX PO) Take by mouth.     Prucalopride Succinate  (MOTEGRITY ) 2 MG TABS Take 1 tablet by mouth in the morning.     pyridostigmine (MESTINON) 60 MG tablet Take 60 mg by mouth in the morning and at bedtime.     QUERCETIN PO Take 3,000 mg by mouth daily in the afternoon.     Riboflavin 100 MG TABS Take 200 mg by mouth 2 (two) times daily.     Ubrogepant  (UBRELVY ) 100 MG TABS Take 1 tablet (100 mg total) by mouth every 2 (two) hours as needed. Maximum 200mg  a day. Please use copay card: BIN [id number] PCN [id number] GRP [id number] ID [id number] 16 tablet 11   UNABLE TO FIND Take by mouth 2 (two) times daily. Med Name: MAGNESIUM COMPLEX  200 mg AM and 400 mg PM     UNABLE TO FIND Take 1,000 mg by mouth daily. Med Name: VITASSIUM brand salt capsules     XOLAIR 150 MG/ML prefilled syringe Inject 300 mg into the skin every 14 (fourteen) days.     No facility-administered medications prior to visit.    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  Tobacco Use   Smoking status: Former    Current packs/day: 0.00    Average packs/day: 0.5 packs/day for 2.0 years (1.0 ttl pk-yrs)    Types: Cigarettes    Start date: 03/26/2014    Quit date: 03/26/2016    Years since quitting: 7.8   Smokeless tobacco: Never  Vaping Use   Vaping status: Never Used  Substance and Sexual Activity   Alcohol use: Yes    Alcohol/week: 1.0 - 2.0 standard drink of alcohol    Types: 1 - 2 Standard drinks or equivalent per week    Comment: Very rarely drink cocktail   Drug use: Not Currently    Types: Marijuana    Comment: Used to smoke in college   Sexual activity: Yes    Birth control/protection: I.U.D.    Comment: 1 partner - my husband  Other Topics Concern   Not on file  Social History Narrative   Caffeine: none   Social Drivers of Corporate investment banker Strain: Low Risk  (12/12/2023)   Overall Financial Resource Strain (CARDIA)    Difficulty of Paying Living Expenses: Not hard at all  Food Insecurity: No Food Insecurity (12/12/2023)   Hunger Vital Sign    Worried About Running Out of Food in the Last Year: Never true    Ran Out of Food in the Last Year: Never true  Transportation Needs: No Transportation Needs (12/12/2023)   PRAPARE - Administrator, Civil Service (Medical): No    Lack of Transportation (Non-Medical): No  Physical Activity: Sufficiently Active (12/12/2023)   Exercise Vital Sign    Days of Exercise per Week: 5 days    Minutes of Exercise per Session: 60 min  Stress: Stress Concern Present (12/12/2023)   [employer] of Occupational Health - Occupational Stress Questionnaire    Feeling of Stress: To some extent  Social Connections: Socially Integrated (12/12/2023)   Social Connection and Isolation Panel    Frequency of  Communication with Friends and Family: Three times a week    Frequency of Social Gatherings with Friends and Family: Three times a week    Attends Religious Services: 1 to 4 times per year    Active Member of Clubs or Organizations: Yes    Attends Banker Meetings: 1 to 4 times per year    Marital Status: Married           Objective:  Physical Exam: BP 111/69 (BP Location: Left Arm, Patient Position: Sitting, Cuff Size: Large)   Pulse 66   Temp (!) 97.5 F (36.4 C) (Temporal)   Resp 18   Wt 180 lb 9.6 oz (81.9 kg)   LMP  (Exact Date)   SpO2 99%   BMI 26.67 kg/m   Body mass index is 26.67 kg/m. Wt Readings from Last 3 Encounters:  01/16/24 180 lb 9.6 oz (81.9 kg)  12/13/23 184 lb (83.5 kg)  10/11/23 192 lb (87.1 kg)    Physical Exam VITALS: BP- 117/74 MEASUREMENTS: Weight- 184, BMI- 27.0. GENERAL: Alert, cooperative, well developed, no acute  distress HEENT: Normocephalic, normal oropharynx, moist mucous membranes CHEST: Clear to auscultation bilaterally, no wheezes, rhonchi, or crackles CARDIOVASCULAR: Normal heart rate and rhythm, S1 and S2 normal without murmurs ABDOMEN: Soft, mild general tenderness EXTREMITIES: Pitting 1+ lower extremity edema in bilaleral lower extremties NEUROLOGICAL: Cranial nerves grossly intact, moves all extremities without gross motor or sensory deficit   MEASUREMENTS: Weight- 175. GENERAL: Alert, cooperative, well developed, no acute distress HEENT: Normocephalic, normal oropharynx, moist mucous membranes CHEST: Clear to auscultation bilaterally, No wheezes, rhonchi, or crackles CARDIOVASCULAR: Normal heart rate and rhythm, S1 and S2 normal without murmurs ABDOMEN: Soft, non-tender, non-distended, without organomegaly, Normal bowel sounds EXTREMITIES: No cyanosis or edema NEUROLOGICAL: Cranial nerves grossly intact, Moves all extremities without gross motor or sensory deficit  Physical Exam ECG sinus rhythm with no  arrhythmia or QT abnormalities show a copy     Prior labs:   Recent Results (from the past 2160 hours)  Hemoglobin A1c     Status: None   Collection Time: 12/13/23 11:24 AM  Result Value Ref Range   Hgb A1c MFr Bld 5.6 4.6 - 6.5 %    Comment: Glycemic Control Guidelines for People with Diabetes:Non Diabetic:  <6%Goal of Therapy: <7%Additional Action Suggested:  >8%   Microalbumin / creatinine urine ratio     Status: None   Collection Time: 12/13/23 11:24 AM  Result Value Ref Range   Microalb, Ur 0.7 0.0 - 1.9 mg/dL   Creatinine,U 865.1 mg/dL   Microalb Creat Ratio 5.5 0.0 - 30.0 mg/g  Vitamin D  1,25 dihydroxy     Status: None   Collection Time: 12/13/23 11:24 AM  Result Value Ref Range   Vitamin D  1, 25 (OH)2 Total 29 18 - 72 pg/mL   Vitamin D3 1, 25 (OH)2 29 pg/mL   Vitamin D2 1, 25 (OH)2 <8 pg/mL    Comment: (Note) Vitamin D3, 1,25(OH)2 indicates both endogenous  production and supplementation. Vitamin D2, 1,25(OH)2 is  an indicator of exogenous sources, such as diet or  supplementation. Interpretation and therapy are based on  measurement of Vitamin D , 1,25 (OH)2, Total. . This test was developed, and its analytical performance  characteristics have been determined by [facility]. It has not been cleared or approved by the  FDA. This assay has been validated pursuant to the CLIA  regulations and is used for clinical purposes. . For additional information, please refer to http://education.[facility].com/faq/FAQ199 (This link is being provided for  informational/educational purposes only.) . [facility] [address] [facility] [address] [place] [address] [phone] Johanna Agent L. Gino, MD, PhD   CBC with Differential/Platelet     Status: Abnormal   Collection Time: 12/13/23 11:24 AM  Result Value Ref Range   WBC 3.7 (L) 4.0 - 10.5 K/uL   RBC 3.93 3.87 - 5.11 Mil/uL   Hemoglobin 11.6 (L) 12.0 - 15.0 g/dL   HCT 65.3 (L) 63.9 - 53.9 %    MCV 88.1 78.0 - 100.0 fl   MCHC 33.4 30.0 - 36.0 g/dL   RDW 86.9 88.4 - 84.4 %   Platelets 190.0 150.0 - 400.0 K/uL   Neutrophils Relative % 52.0 43.0 - 77.0 %   Lymphocytes Relative 38.5 12.0 - 46.0 %   Monocytes Relative 6.6 3.0 - 12.0 %   Eosinophils Relative 0.7 0.0 - 5.0 %   Basophils Relative 2.2 0.0 - 3.0 %   Neutro Abs 1.9 1.4 - 7.7 K/uL   Lymphs Abs 1.4 0.7 - 4.0 K/uL  Monocytes Absolute 0.2 0.1 - 1.0 K/uL   Eosinophils Absolute 0.0 0.0 - 0.7 K/uL   Basophils Absolute 0.1 0.0 - 0.1 K/uL  Urinalysis w microscopic + reflex cultur     Status: None   Collection Time: 12/13/23 11:24 AM   Specimen: Blood  Result Value Ref Range   Color, Urine DARK YELLOW YELLOW   APPearance CLEAR CLEAR   Specific Gravity, Urine 1.024 1.001 - 1.035   pH 5.5 5.0 - 8.0   Glucose, UA NEGATIVE NEGATIVE   Bilirubin Urine NEGATIVE NEGATIVE   Ketones, ur NEGATIVE NEGATIVE   Hgb urine dipstick NEGATIVE NEGATIVE   Protein, ur NEGATIVE NEGATIVE   Nitrites, Initial NEGATIVE NEGATIVE   Leukocyte Esterase NEGATIVE NEGATIVE   Note      Comment: This urine was analyzed for the presence of WBC,  RBC, bacteria, casts, and other formed elements.  Only those elements seen were reported. . .   TSH Rfx on Abnormal to Free T4     Status: None   Collection Time: 12/13/23 11:24 AM  Result Value Ref Range   TSH 2.240 0.450 - 4.500 uIU/mL  HIV antibody (with reflex)     Status: None   Collection Time: 12/13/23 11:24 AM  Result Value Ref Range   HIV FINAL INTERPRETATION HIV NEGATIVE     Comment: HIV-1 antigen and HIV-1/HIV-2 antibodies were not detected. There is no laboratory evidence of HIV infection.    HIV 1&2 Ab, 4th Generation NON-REACTIVE NON-REACTIVE  Iron, TIBC and Ferritin Panel     Status: None   Collection Time: 12/13/23 11:24 AM  Result Value Ref Range   Iron 61 40 - 190 mcg/dL   TIBC 735 749 - 549 mcg/dL (calc)   %SAT 23 16 - 45 % (calc)   Ferritin 114 16 - 154 ng/mL  Vitamin  B5(Pantothenic Acid)     Status: None   Collection Time: 12/13/23 11:24 AM  Result Value Ref Range   Vitamin B5(Pantothenic Acid) 73 <275 ng/mL    Comment: . This test was developed and its analytical performance characteristics have been determined by [facility], [place], [place]. It has not been cleared or approved by the FDA. This assay has been validated pursuant to the CLIA regulations and is used for clinical purposes. .   Biotin (Vitamin B7)     Status: None   Collection Time: 12/13/23 11:24 AM  Result Value Ref Range   Biotin (Vitamin B7) 2,378.7 221.0 - 3,004.0 pg/mL    Comment: . Reference Range: Pediatric < 12 yrs:   100.0 - 2460.2 pg/mL Adult > or = 12 yrs:  221.0 - 3004.0 pg/mL . The performance characteristics of the listed assay was validated by [facility]. The [place]  FDA has not approved or cleared this test. The results of this assay can be used for clinical diagnosis without FDA approval. [facility] is a CLIA certified, CAP accredited laboratory for performing high complexity assays such as this one.   Vitamin B2(Riboflavin),Plasma     Status: Abnormal   Collection Time: 12/13/23 11:24 AM  Result Value Ref Range   Vitamin B2(Riboflavin),Plasma 70.1 (H) 6.2 - 39.0 nmol/L    Comment: SABRA Vitamin supplementation within 24 hours prior to blood draw may affect the accuracy of results. . This test was developed and its analytical performance characteristics have been determined by [facility], [place], [place]. It has not been cleared or approved by the FDA. This assay has been validated pursuant  to the CLIA regulations and is used for clinical purposes. .   Vitamin B1     Status: None   Collection Time: 12/13/23 11:24 AM  Result Value Ref Range   Vitamin B1 (Thiamine) 12 8 - 30 nmol/L    Comment: (Note) Vitamin supplementation within 24 hours prior to blood  draw may affect the accuracy of  the results. . This test was developed and its analytical performance  characteristics have been determined by [facility]. It has not been cleared or approved by FDA.  This assay has been validated pursuant to the CLIA  regulations and is used for clinical purposes. . [facility] [address] [place] [address] [phone] Johanna Agent L. Gino, MD, PhD   Vitamin B3     Status: None   Collection Time: 12/13/23 11:24 AM  Result Value Ref Range   Nicotinic Acid <20  ng/mL    Comment: . Due to the large variability in the metabolism of nicotinic acid, the dosing preparation used (immediate- release vs. extended release), and the mg doses used the serum concentrations may range from less than 20 ng/mL to about 30,000 ng/mL. After oral administration of an immediate-release tablet, peak plasma concentrations occur in 4 to 5 hours. The plasma half-life of nicotinic acid is about one hour. In one study, fasting plasma concentrations were reported to be less than 20 ng/mL. In another study, it was reported that the administration of a single 1000 mg extended-release tablet resulted in mean nicotinic acid concentrations of less than 50 ng/mL. . . This test was developed and its analytical performance characteristics have been determined by [facility], [place], [place]. It has not been cleared or approved by the FDA. This assay has been validated pursuant to the CLIA regulations and is used for clinical purposes. .    Nicotinamide <20  ng/mL    Comment: . Nicotinamide is a metabolite of nicotinic acid. Due to the large variability in the metabolism of nicotinic acid, plasma concentrations of this metabolite are variable. In one study, fasting plasma concentrations were reported to be approximately 40 ng/mL. In another study it was reported that the administration of a single 1000 mg of extended-release tablet of  nicotinic acid resulted in a mean peak nicotinamide concentration of 400 ng/mL between 5 and 10 hours post dose, decreasing to about 100 ng/mL by 16 hours post dose. . . This test was developed and its analytical performance characteristics have been determined by [facility], [place], [place]. It has not been cleared or approved by the FDA. This assay has been validated pursuant to the CLIA regulations and is used for clinical purposes. .   Vitamin B6     Status: Abnormal   Collection Time: 12/13/23 11:24 AM  Result Value Ref Range   Vitamin B6 24.1 (H) 2.1 - 21.7 ng/mL    Comment: (Note) Vitamin supplementation within 24 hours prior to blood  draw may affect the accuracy of results. . This test was developed and its analytical performance  characteristics have been determined by [facility]. It has not been cleared or approved by the  FDA. This assay has been validated pursuant to the CLIA  regulations and is used for clinical purposes. . [facility] [address] [place] [address] [phone] Johanna Agent L. Gino, MD, PhD   REFLEXIVE URINE CULTURE     Status: None   Collection Time: 12/13/23  11:24 AM  Result Value Ref Range   Reflexve Urine Culture      Comment: NO CULTURE INDICATED  Magnesium     Status: None   Collection Time: 12/13/23 12:57 PM  Result Value Ref Range   Magnesium 2.0 1.5 - 2.5 mg/dL  Lipid panel     Status: None   Collection Time: 12/13/23 12:57 PM  Result Value Ref Range   Cholesterol 147 <200 mg/dL   HDL 63 > OR = 50 mg/dL   Triglycerides 46 <849 mg/dL   LDL Cholesterol (Calc) 71 mg/dL (calc)    Comment: Reference range: <100 . Desirable range <100 mg/dL for primary prevention;   <70 mg/dL for patients with CHD or diabetic patients  with > or = 2 CHD risk factors. SABRA LDL-C is now calculated using the Martin-Hopkins  calculation, which is a validated novel method providing   better accuracy than the Friedewald equation in the  estimation of LDL-C.  Gladis APPLETHWAITE et al. SANDREA. 7986;689(80): 2061-2068  (http://education.[facility].com/faq/FAQ164)    Total CHOL/HDL Ratio 2.3 <5.0 (calc)   Non-HDL Cholesterol (Calc) 84 <869 mg/dL (calc)    Comment: For patients with diabetes plus 1 major ASCVD risk  factor, treating to a non-HDL-C goal of <100 mg/dL  (LDL-C of <29 mg/dL) is considered a therapeutic  option.   Comprehensive metabolic panel with GFR     Status: Abnormal   Collection Time: 12/13/23 12:57 PM  Result Value Ref Range   Glucose, Bld 90 65 - 99 mg/dL    Comment: .            Fasting reference interval .    BUN 15 7 - 25 mg/dL   Creat 9.30 9.49 - 9.03 mg/dL   eGFR 877 > OR = 60 fO/fpw/8.26f7   BUN/Creatinine Ratio SEE NOTE: 6 - 22 (calc)    Comment:    Not Reported: BUN and Creatinine are within    reference range. .    Sodium 136 135 - 146 mmol/L   Potassium 4.1 3.5 - 5.3 mmol/L   Chloride 104 98 - 110 mmol/L   CO2 24 20 - 32 mmol/L   Calcium 9.2 8.6 - 10.2 mg/dL   Total Protein 6.8 6.1 - 8.1 g/dL   Albumin 4.7 3.6 - 5.1 g/dL   Globulin 2.1 1.9 - 3.7 g/dL (calc)   AG Ratio 2.2 1.0 - 2.5 (calc)   Total Bilirubin 0.4 0.2 - 1.2 mg/dL   Alkaline phosphatase (APISO) 28 (L) 31 - 125 U/L   AST 14 10 - 30 U/L   ALT 9 6 - 29 U/L  Folate     Status: None   Collection Time: 12/13/23 12:57 PM  Result Value Ref Range   Folate 15.2 ng/mL    Comment:                            Reference Range                            Low:           <3.4                            Borderline:    3.4-5.4  Normal:        >5.4 .   Vitamin B12     Status: None   Collection Time: 12/13/23 12:57 PM  Result Value Ref Range   Vitamin B-12 377 200 - 1,100 pg/mL    Comment: . Please Note: Although the reference range for vitamin B12 is 850 815 8169 pg/mL, it has been reported that between 5 and 10% of patients with values between 200 and  400 pg/mL may experience neuropsychiatric and hematologic abnormalities due to occult B12 deficiency; less than 1% of patients with values above 400 pg/mL will have symptoms. .     Lab Results  Component Value Date   CHOL 147 12/13/2023   Lab Results  Component Value Date   HDL 63 12/13/2023   Lab Results  Component Value Date   LDLCALC 71 12/13/2023   Lab Results  Component Value Date   TRIG 46 12/13/2023   Lab Results  Component Value Date   CHOLHDL 2.3 12/13/2023   No results found for: LDLDIRECT  Last metabolic panel Lab Results  Component Value Date   GLUCOSE 90 12/13/2023   NA 136 12/13/2023   K 4.1 12/13/2023   CL 104 12/13/2023   CO2 24 12/13/2023   BUN 15 12/13/2023   CREATININE 0.69 12/13/2023   GFR 114.78 05/09/2023   CALCIUM 9.2 12/13/2023   PROT 6.8 12/13/2023   ALBUMIN 4.7 05/09/2023   LABGLOB 2.7 02/19/2023   BILITOT 0.4 12/13/2023   ALKPHOS 31 (L) 05/09/2023   AST 14 12/13/2023   ALT 9 12/13/2023    Lab Results  Component Value Date   HGBA1C 5.6 12/13/2023    Last CBC Lab Results  Component Value Date   WBC 3.7 (L) 12/13/2023   HGB 11.6 (L) 12/13/2023   HCT 34.6 (L) 12/13/2023   MCV 88.1 12/13/2023   RDW 13.0 12/13/2023   PLT 190.0 12/13/2023    Lab Results  Component Value Date   TSH 2.240 12/13/2023    No results found for: PSA1, PSA  Last vitamin D  Lab Results  Component Value Date   VD25OH 29.8 (L) 02/19/2023    Lab Results  Component Value Date   PROTEINUR NEGATIVE 12/13/2023    Lab Results  Component Value Date   MICROALBUR 0.7 12/13/2023     At today's visit, we discussed treatment options, associated risk and benefits, and engage in counseling as needed.  Additionally the following were reviewed: Past medical records, past medical and surgical history, family and social background, as well as relevant laboratory results, imaging findings, and specialty notes, where applicable.  This message was  generated using dictation software, and as a result, it may contain unintentional typos or errors.  Nevertheless, extensive effort was made to accurately convey at the pertinent aspects of the patient visit.    There may have been are other unrelated non-urgent complaints, but due to the busy schedule and the amount of time already spent with her, time does not permit to address these issues at today's visit. Another appointment may have or has been requested to review these additional issues.     Arvella Hummer, MD, MS

## 2024-01-16 NOTE — Telephone Encounter (Signed)
 Paperwork was placed in provider review/sign folder on 01/15/2024. Pt have a OV scheduled for today and will be address at that time

## 2024-02-26 ENCOUNTER — Encounter: Payer: Self-pay | Admitting: Family Medicine

## 2024-03-03 NOTE — Telephone Encounter (Signed)
 Faxed paperwork at 12pm. Copy of signed document is kept in my already faxed pile. NFN

## 2024-03-10 NOTE — Telephone Encounter (Signed)
 Hey Dr. Sebastian,    I pulled the forms & the forms itself is not the issue. Something had to transpire with the fax machine hence why it look funny on her end. I did contact the patient to see what else was needed, & she stated the issue is the the date is set to 01/16/2024 and she believed this was supposed to be lifelong changes. Please revisit this paperwork and update as needed.

## 2024-03-13 NOTE — Telephone Encounter (Signed)
 I need a copy of the form printed and to review more closely. Most places don't like life time designation. Please print me a new copy and put it in my box so I it can be reviewed in more detail.

## 2024-03-16 ENCOUNTER — Encounter: Payer: Self-pay | Admitting: Family Medicine

## 2024-03-16 NOTE — Telephone Encounter (Signed)
 Contacted patient she stated she will upload a new form on MyChart. Once received I will place it in Dr. Oswald box.

## 2024-03-17 NOTE — Telephone Encounter (Signed)
 Received paperwork yesterday and left in Dr. Sebastian to sign box.

## 2024-04-06 ENCOUNTER — Ambulatory Visit: Admitting: Family Medicine

## 2024-04-06 VITALS — BP 133/78 | HR 86 | Temp 97.9°F | Ht 69.0 in | Wt 177.4 lb

## 2024-04-06 DIAGNOSIS — G90A Postural orthostatic tachycardia syndrome (POTS): Secondary | ICD-10-CM | POA: Diagnosis not present

## 2024-04-06 DIAGNOSIS — G8929 Other chronic pain: Secondary | ICD-10-CM | POA: Diagnosis not present

## 2024-04-06 DIAGNOSIS — M2550 Pain in unspecified joint: Secondary | ICD-10-CM

## 2024-04-06 DIAGNOSIS — I951 Orthostatic hypotension: Secondary | ICD-10-CM

## 2024-04-06 DIAGNOSIS — Z883 Allergy status to other anti-infective agents status: Secondary | ICD-10-CM

## 2024-04-06 NOTE — Progress Notes (Signed)
 " Assessment & Plan   Assessment/Plan:   Assessment & Plan Postural orthostatic tachycardia syndrome (POTS) and dysautonomia orthostatic tachycardia syndrome POTS and dysautonomia are managed with atenolol 25 mg in the morning and 12.5 mg midday. Symptoms include increased heart rate after strenuous activity, requiring rest. Improvement noted with weight loss and medication adjustments. - Continue atenolol 25 mg in the morning and 12.5 mg midday. - Allow 5 min rest breaks after strenuous activity as needed.  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 The allergy is significant enough to require avoidance of Betadine exposure. - Avoid exposure to Betadine indefinitely.  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 Improvement noted with weight loss and current management strategies. - Continue current management strategies.        There are no discontinued medications.  Follow up as needed        Subjective:   Encounter date: 04/06/2024  Shelby Dyer is a 29 y.o. female who has Hypermobility syndrome; Dysautonomia orthostatic hypotension syndrome; Mast cell activation; Chronic migraine without aura, with intractable migraine, so stated, with status migrainosus; Inappropriate sinus tachycardia; Median nerve neuritis, right; Idiopathic hypersomnolence; IBS (irritable bowel syndrome); Hip pain; Chronic idiopathic constipation; Ehlers-Danlos syndrome; PTSD (post-traumatic stress disorder); Patellofemoral pain syndrome of both knees; Myofascial pain syndrome, cervical; Foreign body of left index finger with infection; Chronic pain of multiple joints; Class 2 obesity in adult; Migraine with aura and with status migrainosus, not intractable; Weight loss, non-intentional; Generalized intestinal  dysmotility; Prediabetes; Mild protein-calorie malnutrition; Vitamin D  deficiency; Vitamin B deficiency; Iron deficiency; POTS (postural orthostatic tachycardia syndrome); Gait instability; Gastric motility disorder; and 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   She  has a past medical history of Allergy (2000), Anemia (2020), Anxiety, Asthma (Early 2021), Chronic idiopathic constipation, Chronic migraine with aura, Depression (2012?), Hypermobile Ehlers-Danlos syndrome, Idiopathic hypersomnolence, Mast cell activation syndrome, Myofascial pain syndrome, cervical (11/14/2021), Needle stick, hypodermic, accidental, initial encounter (01/01/2022), Peripheral neuropathy, POTS (postural orthostatic tachycardia syndrome), Syncope and collapse (Dec 2021), and Ulcer (2018)..   She presents with chief complaint of Medical Management of Chronic Issues (Here to get her ADA form updated /) .   Discussed the use of AI scribe software for clinical note transcription with the patient, who gave verbal consent to proceed.  History of Present Illness Shelby Dyer is a 29 year old female with dysautonomia, orthostatic hypertension syndrome, and hypermobility syndrome who presents for recertification of her work accommodations.  Autonomic dysfunction and orthostatic symptoms - Dysautonomia and orthostatic hypertension syndrome managed with atenolol, 25 mg in the morning and 12.5 mg midday - Requires frequent breaks to catch her breath, lasting up to five minutes and occurring as frequently as five times per hour - Currently on intermittent FMLA due to difficulty balancing work duties with medical conditions - Follows with cardiology for ongoing management  Gastrointestinal symptoms - Gastrointestinal symptoms have slightly improved with a new sodium channel blocker, which has been beneficial for irritable bowel syndrome symptoms - Uses Reglan  to manage episodes of vomiting, which is effective in controlling  symptoms after the initial episode  Allergic reactions to betadine - Contact allergy to Betadine, 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, theatre/television/film - Exposure to Betadine during surgeries causes allergic reactions due to aerosolization and inhalation of fumes - Assisting in surgeries is an essential job duty, making this a significant concern  Hypermobility and musculoskeletal symptoms - Significant weight loss of approximately 150 pounds has improved joint pain associated with hypermobility syndrome - Joints feel better without the extra weight     ROS  Past Surgical History:  Procedure Laterality Date   adenoids removed     double scope      ESOPHAGOGASTRODUODENOSCOPY ENDOSCOPY  10/02/2023   Tub place in both ear  Bilateral    When she was 29 years of age   WISDOM TOOTH EXTRACTION  2006   All 4    Outpatient Medications Prior to Visit  Medication Sig Dispense Refill   acetaminophen (TYLENOL) 500 MG tablet Take 1,000 mg by mouth as needed.     Ascorbic Acid (VITAMIN C PO) Take 500-2,000 mg by mouth daily in the afternoon.     atenolol (TENORMIN) 25 MG tablet Take by mouth daily. 25 mg in the morning and 12.5 mg mid day.     b complex vitamins capsule Take 1 capsule by mouth daily. 90 capsule 3   cetirizine (ZYRTEC) 10 MG tablet Take 20 mg by mouth in the morning and at bedtime. (Patient taking differently: Take 20 mg by mouth every morning.)     Coenzyme Q10 (COQ-10 PO) Take 400 mg by mouth daily at 6 (six) AM.     EPINEPHrine  (PRIMATENE  MIST) 0.125 MG/ACT AERO 1-2 puffs as needed.     EPINEPHrine  0.3 mg/0.3 mL IJ SOAJ injection Inject 0.3 mg into the muscle as needed for anaphylaxis.     famotidine (PEPCID) 20 MG tablet Take 20 mg by mouth 2 (two) times daily.     gabapentin (NEURONTIN) 100 MG capsule Take 100 mg by mouth 3 (three) times daily.     Galcanezumab -gnlm (EMGALITY ) 120 MG/ML SOAJ Inject 120 mg into the skin every 30 (thirty) days. PLEASE USE COPAY CARD BIN: [id number] PCN  PDMI GRP [id number] ID [id number] Expires 03/26/2023 3 mL 11   hydroxychloroquine (PLAQUENIL) 200 MG tablet Take 200 mg by mouth 2 (two) times daily.     hydrOXYzine (ATARAX) 25 MG tablet Take 25-50 mg by mouth every 8 (eight) hours as needed (allergies).     levonorgestrel (MIRENA) 20 MCG/24HR IUD 1 each by Intrauterine route once.     metoCLOPramide  (REGLAN ) 5 MG tablet Take 1 tablet (5 mg total) by mouth every 6 (six) hours as needed for nausea or vomiting. 20 tablet 1   Multiple Vitamin (MULTIVITAMIN) tablet Take 1 tablet by mouth daily.     Multiple Vitamins-Minerals (ZINC PO) Take by mouth.     Nutritional Supplements (KATE FARMS STANDARD 1.0) LIQD 1 Bottle TID. 29250 mL 11   ondansetron  (ZOFRAN ) 4 MG tablet Take 1 tablet (4 mg total) by mouth every 8 (eight) hours as needed for nausea or vomiting. 20 tablet 0   Polyethylene Glycol 3350 (MIRALAX PO) Take by mouth.     Prucalopride Succinate  (MOTEGRITY ) 2 MG TABS Take 1 tablet by mouth in the morning.     pyridostigmine (MESTINON) 60 MG tablet Take 60 mg by mouth in the morning and at bedtime.     QUERCETIN PO Take 3,000 mg by mouth daily in the afternoon.     Riboflavin  100 MG TABS Take 200 mg by mouth 2 (two) times daily.     Tenapanor HCl (IBSRELA) 50 MG TABS Take 50 mg by mouth 2 (two) times daily.     Ubrogepant  (UBRELVY ) 100 MG TABS Take 1 tablet (100 mg total) by mouth every 2 (two) hours as  needed. Maximum 200mg  a day. Please use copay card: BIN [id number] PCN [id number] GRP [id number] ID [id number] 16 tablet 11   UNABLE TO FIND Take by mouth 2 (two) times daily. Med Name: MAGNESIUM COMPLEX  200 mg AM and 400 mg PM     UNABLE TO FIND Take 1,000 mg by mouth daily. Med Name: VITASSIUM brand salt capsules     XOLAIR 150 MG/ML prefilled syringe Inject 300 mg into the skin every 14 (fourteen) days.     lubiprostone (AMITIZA) 8 MCG capsule Take 8 mcg by mouth 2 (two) times daily with a meal. (Patient not taking: Reported on 04/06/2024)     No  facility-administered medications prior to visit.    Family History  Problem Relation Age of Onset   Migraines Mother    Anxiety disorder Mother    Depression Mother    Ehlers-Danlos syndrome Mother    Atrial fibrillation Father    Arrhythmia Father        A-fib   Other Sister    Anxiety disorder Sister    Depression Sister    Learning disabilities Sister    Other Sister        POTS   Obesity Sister    Hypertension Brother    Asthma Brother    Learning disabilities Brother    Depression Brother    Hypertension Brother    Learning disabilities Brother    Obesity Brother    Migraines Maternal Grandmother    Kidney disease Maternal Grandfather    Stroke Maternal Grandfather    Breast cancer Paternal Grandmother    Cancer Paternal Grandmother    Varicose Veins Paternal Grandmother    Cancer Paternal Grandfather    Early death Maternal Aunt    Colon cancer Neg Hx    Stomach cancer Neg Hx    Liver cancer Neg Hx    Pancreatic cancer Neg Hx     Social History   Socioeconomic History   Marital status: Married    Spouse name: Leontine   Number of children: Not on file   Years of education: Not on file   Highest education level: Bachelor's degree (e.g., BA, AB, BS)  Occupational History   Occupation: Museum/gallery conservator    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  Tobacco Use   Smoking status: Former    Current packs/day: 0.00    Average packs/day: 0.5 packs/day for 2.0 years (1.0 ttl pk-yrs)    Types: Cigarettes    Start date: 03/26/2014    Quit date: 03/26/2016    Years since quitting: 8.0   Smokeless tobacco: Never  Vaping Use   Vaping status: Never Used  Substance and Sexual Activity   Alcohol use: Yes    Alcohol/week: 1.0 - 2.0 standard drink of alcohol    Types: 1 - 2 Standard drinks or equivalent per week    Comment: Very rarely drink cocktail   Drug use: Not Currently    Types: Marijuana    Comment: Used to smoke in college   Sexual activity: Yes    Birth  control/protection: I.U.D.    Comment: 1 partner - my husband  Other Topics Concern   Not on file  Social History Narrative   Caffeine: none   Social Drivers of Health   Tobacco Use: Medium Risk (01/20/2024)   Received from [facility]   Patient History    Smoking Tobacco Use: Former    Smokeless Tobacco Use: Never    Passive Exposure: Not on file  Financial Resource Strain: Low Risk (12/12/2023)   Overall Financial Resource Strain (CARDIA)    Difficulty of Paying Living Expenses: Not hard at all  Food Insecurity: No Food Insecurity (12/12/2023)   Epic    Worried About Programme Researcher, Broadcasting/film/video in the Last Year: Never true    Ran Out of Food in the Last Year: Never true  Transportation Needs: No Transportation Needs (12/12/2023)   Epic    Lack of Transportation (Medical): No    Lack of Transportation (Non-Medical): No  Physical Activity: Sufficiently Active (12/12/2023)   Exercise Vital Sign    Days of Exercise per Week: 5 days    Minutes of Exercise per Session: 60 min  Stress: Stress Concern Present (12/12/2023)   [employer] of Occupational Health - Occupational Stress Questionnaire    Feeling of Stress: To some extent  Social Connections: Socially Integrated (12/12/2023)   Social Connection and Isolation Panel    Frequency of Communication with Friends and Family: Three times a week    Frequency of Social Gatherings with Friends and Family: Three times a week    Attends Religious Services: 1 to 4 times per year    Active Member of Clubs or Organizations: Yes    Attends Banker Meetings: 1 to 4 times per year    Marital Status: Married  Catering Manager Violence: Not At Risk (02/14/2022)   Received from [facility]   HITS    Over the last 12 months how often did your partner physically hurt you?: Never    Over the last 12 months how often did your partner insult you or talk down to you?: Never    Over the last 12 months how often did your partner threaten  you with physical harm?: Never    Over the last 12 months how often did your partner scream or curse at you?: Never  Depression (PHQ2-9): Low Risk (04/06/2024)   Depression (PHQ2-9)    PHQ-2 Score: 0  Alcohol Screen: Low Risk (12/12/2023)   Alcohol Screen    Last Alcohol Screening Score (AUDIT): 2  Housing: Low Risk (12/12/2023)   Epic    Unable to Pay for Housing in the Last Year: No    Number of Times Moved in the Last Year: 0    Homeless in the Last Year: No  Utilities: Not on file  Health Literacy: Not on file                                                                                                  Objective:  Physical Exam: BP 133/78   Pulse 86   Temp 97.9 F (36.6 C)   Ht 5' 9 (1.753 m)   Wt 177 lb 6.4 oz (80.5 kg)   SpO2 100%   BMI 26.20 kg/m    Physical Exam GENERAL: Alert, cooperative, well developed, no acute distress HEENT: Normocephalic, normal oropharynx, moist mucous membranes CHEST: Clear to auscultation bilaterally, No wheezes, rhonchi, or crackles CARDIOVASCULAR: Normal heart rate and rhythm, S1 and S2 normal without murmurs ABDOMEN: Soft, non-tender, non-distended, without organomegaly, Normal  bowel sounds EXTREMITIES: No cyanosis or edema NEUROLOGICAL: Cranial nerves grossly intact, Moves all extremities without gross motor or sensory deficit   Physical Exam  No results found.  No results found for this or any previous visit (from the past 2160 hours).      Beverley Adine Hummer, MD, MS "

## 2024-04-07 ENCOUNTER — Encounter: Payer: Self-pay | Admitting: Family Medicine

## 2024-04-16 ENCOUNTER — Telehealth: Payer: Self-pay | Admitting: Neurology

## 2024-04-16 NOTE — Telephone Encounter (Signed)
 MYC cxl

## 2024-04-17 ENCOUNTER — Encounter: Payer: Self-pay | Admitting: Family Medicine

## 2024-04-20 NOTE — Telephone Encounter (Signed)
 Please print me a new copy of their form and I will update.

## 2024-04-21 NOTE — Telephone Encounter (Signed)
 New Copy place is providers box

## 2024-04-22 NOTE — Telephone Encounter (Signed)
 As per the patient request, I have done the documentation again. I returned it to CMA to fax to patient.

## 2024-06-11 ENCOUNTER — Ambulatory Visit: Admitting: Family Medicine

## 2024-10-14 ENCOUNTER — Ambulatory Visit: Admitting: Neurology
